# Patient Record
Sex: Female | Born: 1985
Health system: Southern US, Community
[De-identification: ages and names within clinical notes are randomized; demographics above are authoritative.]

## PROBLEM LIST (undated history)

## (undated) DIAGNOSIS — I10 Essential (primary) hypertension: Secondary | ICD-10-CM

## (undated) DIAGNOSIS — J4540 Moderate persistent asthma, uncomplicated: Secondary | ICD-10-CM

## (undated) DIAGNOSIS — M23329 Other meniscus derangements, posterior horn of medial meniscus, unspecified knee: Secondary | ICD-10-CM

## (undated) DIAGNOSIS — O24419 Gestational diabetes mellitus in pregnancy, unspecified control: Secondary | ICD-10-CM

## (undated) DIAGNOSIS — J302 Other seasonal allergic rhinitis: Secondary | ICD-10-CM

## (undated) DIAGNOSIS — S83512A Sprain of anterior cruciate ligament of left knee, initial encounter: Secondary | ICD-10-CM

## (undated) DIAGNOSIS — K219 Gastro-esophageal reflux disease without esophagitis: Secondary | ICD-10-CM

## (undated) DIAGNOSIS — Z3480 Encounter for supervision of other normal pregnancy, unspecified trimester: Secondary | ICD-10-CM

## (undated) DIAGNOSIS — G43909 Migraine, unspecified, not intractable, without status migrainosus: Secondary | ICD-10-CM

## (undated) HISTORY — DX: Essential (primary) hypertension: I10

## (undated) HISTORY — DX: Moderate persistent asthma, uncomplicated: J45.40

## (undated) HISTORY — DX: Encounter for supervision of other normal pregnancy, unspecified trimester: Z34.80

## (undated) HISTORY — DX: Gestational diabetes mellitus in pregnancy, unspecified control: O24.419

---
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
## 2003-10-21 ENCOUNTER — Emergency Department (HOSPITAL_COMMUNITY): Admission: EM | Admit: 2003-10-21 | Discharge: 2003-10-21 | Payer: Self-pay | Admitting: Emergency Medicine

## 2004-03-20 ENCOUNTER — Emergency Department (HOSPITAL_COMMUNITY): Admission: EM | Admit: 2004-03-20 | Discharge: 2004-03-20 | Payer: Self-pay | Admitting: Emergency Medicine

## 2004-12-30 ENCOUNTER — Emergency Department (HOSPITAL_COMMUNITY): Admission: EM | Admit: 2004-12-30 | Discharge: 2004-12-30 | Payer: Self-pay | Admitting: Emergency Medicine

## 2005-01-15 ENCOUNTER — Other Ambulatory Visit: Admission: RE | Admit: 2005-01-15 | Discharge: 2005-01-15 | Payer: Self-pay | Admitting: Obstetrics and Gynecology

## 2005-06-23 ENCOUNTER — Inpatient Hospital Stay (HOSPITAL_COMMUNITY): Admission: AD | Admit: 2005-06-23 | Discharge: 2005-06-23 | Payer: Self-pay | Admitting: Obstetrics and Gynecology

## 2005-06-28 ENCOUNTER — Inpatient Hospital Stay (HOSPITAL_COMMUNITY): Admission: RE | Admit: 2005-06-28 | Discharge: 2005-06-28 | Payer: Self-pay | Admitting: Obstetrics and Gynecology

## 2005-06-29 ENCOUNTER — Inpatient Hospital Stay (HOSPITAL_COMMUNITY): Admission: AD | Admit: 2005-06-29 | Discharge: 2005-07-01 | Payer: Self-pay | Admitting: Obstetrics and Gynecology

## 2005-08-04 ENCOUNTER — Other Ambulatory Visit: Admission: RE | Admit: 2005-08-04 | Discharge: 2005-08-04 | Payer: Self-pay | Admitting: Obstetrics and Gynecology

## 2006-02-27 ENCOUNTER — Emergency Department (HOSPITAL_COMMUNITY): Admission: EM | Admit: 2006-02-27 | Discharge: 2006-02-27 | Payer: Self-pay | Admitting: Family Medicine

## 2006-07-25 ENCOUNTER — Emergency Department (HOSPITAL_COMMUNITY): Admission: EM | Admit: 2006-07-25 | Discharge: 2006-07-25 | Payer: Self-pay | Admitting: Emergency Medicine

## 2008-12-17 ENCOUNTER — Emergency Department (HOSPITAL_COMMUNITY): Admission: EM | Admit: 2008-12-17 | Discharge: 2008-12-17 | Payer: Self-pay | Admitting: Family Medicine

## 2009-03-04 ENCOUNTER — Emergency Department (HOSPITAL_BASED_OUTPATIENT_CLINIC_OR_DEPARTMENT_OTHER): Admission: EM | Admit: 2009-03-04 | Discharge: 2009-03-04 | Payer: Self-pay | Admitting: Emergency Medicine

## 2009-03-06 ENCOUNTER — Emergency Department (HOSPITAL_COMMUNITY): Admission: EM | Admit: 2009-03-06 | Discharge: 2009-03-06 | Payer: Self-pay | Admitting: Family Medicine

## 2009-05-13 ENCOUNTER — Emergency Department (HOSPITAL_COMMUNITY): Admission: EM | Admit: 2009-05-13 | Discharge: 2009-05-13 | Payer: Self-pay | Admitting: Emergency Medicine

## 2009-09-03 ENCOUNTER — Other Ambulatory Visit: Admission: RE | Admit: 2009-09-03 | Discharge: 2009-09-03 | Payer: Self-pay | Admitting: *Deleted

## 2010-04-27 LAB — RAPID STREP SCREEN (MED CTR MEBANE ONLY): Streptococcus, Group A Screen (Direct): NEGATIVE

## 2010-04-29 LAB — POCT I-STAT, CHEM 8
Creatinine, Ser: 0.9 mg/dL (ref 0.4–1.2)
Glucose, Bld: 113 mg/dL — ABNORMAL HIGH (ref 70–99)
HCT: 44 % (ref 36.0–46.0)
Hemoglobin: 15 g/dL (ref 12.0–15.0)
Potassium: 3.5 mEq/L (ref 3.5–5.1)
TCO2: 24 mmol/L (ref 0–100)

## 2010-04-29 LAB — CBC
HCT: 42.5 % (ref 36.0–46.0)
MCHC: 34.3 g/dL (ref 30.0–36.0)
MCV: 95.8 fL (ref 78.0–100.0)
Platelets: 251 10*3/uL (ref 150–400)

## 2010-04-29 LAB — DIFFERENTIAL
Basophils Relative: 0 % (ref 0–1)
Eosinophils Absolute: 0 10*3/uL (ref 0.0–0.7)
Eosinophils Relative: 0 % (ref 0–5)
Lymphs Abs: 1.3 10*3/uL (ref 0.7–4.0)
Monocytes Relative: 2 % — ABNORMAL LOW (ref 3–12)
Neutrophils Relative %: 88 % — ABNORMAL HIGH (ref 43–77)

## 2010-04-29 LAB — POCT CARDIAC MARKERS
CKMB, poc: <1 ng/mL — ABNORMAL LOW (ref 1.0–8.0)
Myoglobin, poc: 158 ng/mL (ref 12–200)
Myoglobin, poc: 178 ng/mL (ref 12–200)
Troponin i, poc: <0.05 ng/mL (ref 0.00–0.09)

## 2010-06-08 ENCOUNTER — Ambulatory Visit (INDEPENDENT_AMBULATORY_CARE_PROVIDER_SITE_OTHER): Payer: 59

## 2010-06-08 ENCOUNTER — Inpatient Hospital Stay (INDEPENDENT_AMBULATORY_CARE_PROVIDER_SITE_OTHER)
Admission: RE | Admit: 2010-06-08 | Discharge: 2010-06-08 | Disposition: A | Discharge status: Home / Self Care | Payer: 59 | Source: Ambulatory Visit | Attending: Family Medicine | Admitting: Family Medicine

## 2010-06-08 DIAGNOSIS — IMO0002 Reserved for concepts with insufficient information to code with codable children: Secondary | ICD-10-CM

## 2010-06-26 NOTE — Discharge Summary (Signed)
NAME:  Casey Mcmahon, Casey Mcmahon               ACCOUNT NO.:  [id number]   MEDICAL RECORD NO.:  [id number]          PATIENT TYPE:  INP   LOCATION:  9129                          FACILITY:  [facility]   PHYSICIAN:  James A. Ashley Royalty, M.D.DATE OF BIRTH:  04-17-1985   DATE OF ADMISSION:  06/29/2005  DATE OF DISCHARGE:  07/01/2005                                 DISCHARGE SUMMARY   DISCHARGE DIAGNOSES:  1.  Intrauterine pregnancy at 37-weeks 5-days gestation, delivery.  2.  Mature lung profile.  3.  Trouble with living child.   SPECIAL PROCEDURES:  Delivery.   CONSULTATIONS:  None.   DISCHARGE MEDICATIONS:  Motrin 600 mg.   HISTORY AND PHYSICAL:  This is a 25 year old gravid 3, para 1, AB 1 at 7-  weeks 5-days gestation.  Prenatal care was complicated by musculoskeletal  discomfort.  The patient requested induction of labor.  The cervix was 3 cm  dilated.  Amniocentesis was performed on Jun 28, 2005 and the resulted  yielded an LS ratio of 3.3 with PG present.  For the remainder of the  history and physical, please see chart.   HOSPITAL COURSE:  [facility].  Admission laboratory studies were drawn.  Artificial rupture of membranes  was accomplished which revealed clear fluid.  The patient went on to labor  and delivery on Jun 29, 2005.  The infant was a 6-pound 8-ounce female.  APGAR was 8 at one minute, 9 at five minutes, sent to the newborn nursery.  Delivery was accomplished over an intact perineum.  There was a first-degree  periurethral laceration which was repaired without difficulty.  The  patient's postpartum course was benign.  She was discharged on the second  postpartum day,, afebrile and in satisfactory condition.      James A. Ashley Royalty, M.D.  Electronically Signed     JAM/MEDQ  D:  07/29/2005  T:  07/29/2005  Job:  [id number]

## 2010-07-02 ENCOUNTER — Emergency Department (HOSPITAL_BASED_OUTPATIENT_CLINIC_OR_DEPARTMENT_OTHER)
Admission: EM | Admit: 2010-07-02 | Discharge: 2010-07-02 | Disposition: A | Discharge status: Home / Self Care | Payer: 59 | Attending: Emergency Medicine | Admitting: Emergency Medicine

## 2010-07-02 DIAGNOSIS — G43909 Migraine, unspecified, not intractable, without status migrainosus: Secondary | ICD-10-CM | POA: Insufficient documentation

## 2010-07-02 DIAGNOSIS — J45909 Unspecified asthma, uncomplicated: Secondary | ICD-10-CM | POA: Insufficient documentation

## 2010-07-02 DIAGNOSIS — I10 Essential (primary) hypertension: Secondary | ICD-10-CM | POA: Insufficient documentation

## 2010-07-02 DIAGNOSIS — Z79899 Other long term (current) drug therapy: Secondary | ICD-10-CM | POA: Insufficient documentation

## 2010-07-02 DIAGNOSIS — E7800 Pure hypercholesterolemia, unspecified: Secondary | ICD-10-CM | POA: Insufficient documentation

## 2010-07-09 ENCOUNTER — Ambulatory Visit (INDEPENDENT_AMBULATORY_CARE_PROVIDER_SITE_OTHER): Payer: 59 | Admitting: Family Medicine

## 2010-07-09 DIAGNOSIS — Z01419 Encounter for gynecological examination (general) (routine) without abnormal findings: Secondary | ICD-10-CM

## 2010-07-09 DIAGNOSIS — Z1272 Encounter for screening for malignant neoplasm of vagina: Secondary | ICD-10-CM

## 2010-07-09 DIAGNOSIS — Z30432 Encounter for removal of intrauterine contraceptive device: Secondary | ICD-10-CM

## 2010-07-10 NOTE — Assessment & Plan Note (Signed)
NAME:  Casey Mcmahon, Casey Mcmahon NO.:  [id number]  MEDICAL RECORD NO.:  [id number]           PATIENT TYPE:  LOCATION:  [facility]           FACILITY:  PHYSICIAN:  Tinnie Gens, MD        DATE OF BIRTH:  06/17/1985  DATE OF SERVICE:  07/09/2010                                 CLINIC NOTE  CHIEF COMPLAINT:  Yearly exam and IUD removal.  HISTORY OF PRESENT ILLNESS:  The patient is a 25 year old, gravida 4, para 2-0-2-2, who has had an IUD since 2007, placed by Dr. Sylvester Harder.  She has recently started having migraine headaches and weight gain and she has seen a neurologist, who told her perhaps her migraines are related to her IUD.  She has been on pills in the past that causes significant nausea and hard to remember to take them every day.  She is uninterested in that.  After lengthy discussion, she is interested in birth control.  We discussed Implanon and Depo, limitations of those, weight gain associated with those, and similar hormone method as her IUD.  We discussed copper IUD as a hormone free method that she could try and would be a good candidate for and NuvaRing.  She also has a history of hypertension.  We discussed NuvaRing impact on her blood pressure.  PAST MEDICAL HISTORY:  Significant for asthma, hypertension, migraine headaches, and weight gain.  PAST SURGICAL HISTORY:  Negative.  MEDICATIONS:  She is on Topamax, Naprosyn, and Lotemax.  ALLERGIES:  None known.  OB HISTORY:  She is G4, P2, two vaginal deliveries.  GYN HISTORY:  Last Pap was July 2011.  No history of abnormal paps or STDs.  FAMILY HISTORY:  Significant for diabetes in her mother and grandmother. Her daughter has epilepsy.  SOCIAL HISTORY:  She lives with her boyfriend, her children, and her parents temporarily.  She works in Clinical biochemist.  She does not smoke cigarettes.  She drinks alcohol approximately once weekly and one caffeinated beverage per day.  REVIEW OF  SYSTEMS:  A 14-point review of systems are reviewed.  Please see GYN history in the chart, diffusely positive for numbness, fatigue, weight loss, weight gain, frequent headaches, problem with vision, nausea, vomiting, blood in her stool, and hot flashes.  PHYSICAL EXAM:  VITAL SIGNS:  Today, her blood pressure is 133/97, weight is 201. GENERAL:  She is a well-developed, well-nourished female, in no acute distress. HEENT:  Normocephalic, atraumatic.  Sclerae anicteric. NECK:  Supple.  Normal thyroid. LUNGS:  Clear bilaterally. CV:  Regular rate and rhythm without rubs, gallops, or murmurs. ABDOMEN:  Soft, nontender, nondistended. EXTREMITIES:  No cyanosis, clubbing, or edema.  2+ distal pulses. BREASTS:  Symmetric with everted nipples.  No discrete masses.  She does have fibrocystic change periareolarly.  No supraclavicular or axillary adenopathy. GU:  Normal external female genitalia.  BUS normal.  Vagina is pink and rugated.  Cervix is parous without lesion.  The rest of her GU exam reveals a small anteverted uterus.  No adnexal mass or tenderness.  PROCEDURE:  IUD was removed without difficulty.   IMPRESSION: 1. GYN exam with Pap. 2. Desires IUD removal for one extend 5  years, and for two, she has     history of migraine headaches and she does not want a Mirena     reinserted. 3. Contraceptive counseling.  PLAN:  After a lengthy discussion was held with the patient, she has opted for NuvaRing trial.  We have given her two samples.  She is instructed to check her blood pressure weekly and follow with her PCP if it continues to go up.  If she likes this method and it seems to work for her and her blood pressure, we will call her in a prescription for this. If not, she can return to discuss other options in about 2 months. Copper IUD may be the best choice for her.  We did discuss bleeding pattern associated with that device however.           ______________________________ Tinnie Gens, MD    TP/MEDQ  D:  07/09/2010  T:  07/09/2010  Job:  [id number]

## 2010-08-26 ENCOUNTER — Encounter: Payer: Self-pay | Admitting: Gynecology

## 2010-08-31 ENCOUNTER — Other Ambulatory Visit (INDEPENDENT_AMBULATORY_CARE_PROVIDER_SITE_OTHER): Payer: 59

## 2010-08-31 DIAGNOSIS — N912 Amenorrhea, unspecified: Secondary | ICD-10-CM

## 2010-08-31 LAB — HCG, QUANTITATIVE, PREGNANCY: hCG, Beta Chain, Quant, S: <2 m[IU]/mL

## 2010-09-01 ENCOUNTER — Telehealth: Payer: Self-pay

## 2010-09-01 NOTE — Telephone Encounter (Signed)
Patient needs results from yesterday call her on cell please and leave a message with results if she does not pick up. Thanks!

## 2010-09-01 NOTE — Telephone Encounter (Signed)
Patient called and results left on voicemail as she wished.  Pregnancy test Negative

## 2010-10-20 ENCOUNTER — Encounter: Payer: Self-pay | Admitting: Nurse Practitioner

## 2010-10-20 ENCOUNTER — Ambulatory Visit (INDEPENDENT_AMBULATORY_CARE_PROVIDER_SITE_OTHER): Payer: 59 | Admitting: Nurse Practitioner

## 2010-10-20 VITALS — BP 119/79 | HR 84 | Ht 68.5 in | Wt 202.0 lb

## 2010-10-20 DIAGNOSIS — N926 Irregular menstruation, unspecified: Secondary | ICD-10-CM

## 2010-10-20 DIAGNOSIS — N898 Other specified noninflammatory disorders of vagina: Secondary | ICD-10-CM

## 2010-10-20 DIAGNOSIS — N939 Abnormal uterine and vaginal bleeding, unspecified: Secondary | ICD-10-CM

## 2010-10-20 DIAGNOSIS — Z3009 Encounter for other general counseling and advice on contraception: Secondary | ICD-10-CM

## 2010-10-20 LAB — POCT URINE PREGNANCY: Preg Test, Ur: NEGATIVE

## 2010-10-20 MED ORDER — ETONOGESTREL-ETHINYL ESTRADIOL 0.12-0.015 MG/24HR VA RING: VAGINAL_RING | VAGINAL | Status: DC

## 2010-10-20 NOTE — Progress Notes (Signed)
S: Pt  C/O vaginal bleeding since 10/05/10. Changes tampon q2-3 hours. Does not seem to be slowing down. Had her Cleta Alberts IUD out in May. Thought it may help her migraines. It has not seemed to have an impact. She was given Nuvaring at that time. She saw something on TV that scared her away from Nuvaring. Last unprotected intercourse was 1 month ago. She does not desire pregnancy at this time.  O: PE vaginal moderate amt blood in vault. Cervix with lesions. Nuvaring placed  Lab UPT negative  A: Abnormal vaginal bleeding Contraception Migraine  P: After discussion she will retrial Nuvaring. Sample placed today and RX given for one year. If bleeding does not stop she will call office

## 2010-10-20 NOTE — Progress Notes (Signed)
Had Mirena removed in May and it had a pretty normal cycle for one week, she has seen nothing since until August 22, she has been bleeding heavy since this time.  She is interested in having the Mirena put back in as her Migraine syptoms as well as other symptoms that she thought might be coming from this are still present.

## 2010-12-04 ENCOUNTER — Inpatient Hospital Stay (INDEPENDENT_AMBULATORY_CARE_PROVIDER_SITE_OTHER)
Admission: RE | Admit: 2010-12-04 | Discharge: 2010-12-04 | Disposition: A | Discharge status: Home / Self Care | Payer: 59 | Source: Ambulatory Visit | Attending: Emergency Medicine | Admitting: Emergency Medicine

## 2010-12-04 DIAGNOSIS — N949 Unspecified condition associated with female genital organs and menstrual cycle: Secondary | ICD-10-CM

## 2010-12-04 LAB — POCT URINALYSIS DIP (DEVICE)
Leukocytes, UA: NEGATIVE
Nitrite: NEGATIVE
Protein, ur: 100 mg/dL — AB
Urobilinogen, UA: 2 mg/dL — ABNORMAL HIGH (ref 0.0–1.0)
pH: 6 (ref 5.0–8.0)

## 2010-12-04 LAB — POCT PREGNANCY, URINE: Preg Test, Ur: NEGATIVE

## 2010-12-10 ENCOUNTER — Ambulatory Visit: Payer: 59 | Admitting: Obstetrics and Gynecology

## 2010-12-22 ENCOUNTER — Encounter: Payer: Self-pay | Admitting: Obstetrics & Gynecology

## 2010-12-22 ENCOUNTER — Ambulatory Visit (INDEPENDENT_AMBULATORY_CARE_PROVIDER_SITE_OTHER): Payer: 59 | Admitting: Obstetrics & Gynecology

## 2010-12-22 VITALS — BP 128/92 | HR 85 | Ht 68.0 in | Wt 199.0 lb

## 2010-12-22 DIAGNOSIS — Z30430 Encounter for insertion of intrauterine contraceptive device: Secondary | ICD-10-CM

## 2010-12-22 DIAGNOSIS — IMO0001 Reserved for inherently not codable concepts without codable children: Secondary | ICD-10-CM

## 2010-12-22 LAB — POCT URINE PREGNANCY: Preg Test, Ur: NEGATIVE

## 2010-12-22 NOTE — Patient Instructions (Signed)
Intrauterine Device Insertion Care After Refer to this sheet in the next few weeks. These instructions provide you with information on caring for yourself after your procedure. Your caregiver may also give you more specific instructions. Your treatment has been planned according to current medical practices, but problems sometimes occur. Call your caregiver if you have any problems or questions after your procedure. HOME CARE INSTRUCTIONS   Only take over-the-counter or prescription medicines for pain, discomfort, or fever as directed by your caregiver. Do not use aspirin. This may increase bleeding.   Check your IUD to make sure it is in place before you resume sexual activity. You should be able to feel the strings. If you cannot feel the strings, something may be wrong. The IUD may have fallen out of the uterus, or the uterus may have been punctured (perforated) during placement. Also, if the strings are getting longer, it may mean that the IUD is being forced out of the uterus. You no longer have full protection from pregnancy if any of these problems occur.   You may resume sexual intercourse if you are not having problems with the IUD. The IUD is considered immediately effective.   You may resume normal activities.   Keep all follow-up appointments to be sure your IUD has remained in place. After the first exam, yearly exams are advised, unless you cannot feel the strings of your IUD.   Continue to check that the IUD is still in place by feeling for the strings after every menstrual period.  SEEK MEDICAL CARE IF:   You have bleeding that is heavier or lasts longer than a normal menstrual cycle.   You have a fever.   You have increasing cramps or abdominal pain not relieved with medicine.   You have abdominal pain that does not seem to be related to the same area of earlier cramping and pain.   You are lightheaded, unusually weak, or faint.   You have abnormal vaginal discharge or  smells.   You have pain during sexual intercourse.   You cannot feel the IUD strings, or the IUD string has gotten longer.   You feel the IUD at the opening of the cervix in the vagina.   You think you are pregnant, or you miss your menstrual period.   The IUD string is hurting your sex partner.  Document Released: 09/23/2010 Document Revised: 10/07/2010 Document Reviewed: 09/23/2010 Exit[employer] Patient Information 2012 [employer]. 

## 2010-12-22 NOTE — Progress Notes (Signed)
History:  25 y.o. [id number] here for Mirena IUD insertion for contraception.  Had one in the past, removed it in 06/2010.  Now wants it replaced.  No other GYN concerns.  Last pap in 06/2010 was normal.  The following portions of the patient's history were reviewed and updated as appropriate: allergies, current medications, past family history, past medical history, past social history, past surgical history and problem list.   Objective:  Physical Exam Blood pressure 128/92, pulse 85, height 5\' 8"  (1.727 m), weight 199 lb (90.266 kg), last menstrual period 11/16/2010. Gen: NAD Abd: Soft, nontender and nondistended Pelvic: Normal appearing external genitalia; normal appearing vaginal mucosa and cervix.  Normal discharge.  Small uterus, no palpable masses or adnexal tenderness.  IUD Procedure Note Patient identified, informed consent performed.  Discussed risks of malpositioning or placement of the IUD outside the uterus which may require further procedures. Time out was performed.  Urine pregnancy test negative.  Speculum placed in the vagina.  Cervix visualized.  Cleaned with Betadine x 2.  Grasped anteriorly with a single tooth tenaculum.  Uterus sounded to 8 cm.  Mirena IUD placed per manufacturer's recommendations.  Strings trimmed to 3 cm. Tenaculum was removed, good hemostasis noted.  Patient tolerated procedure well.   Patient given post procedure instructions and Mirena care card with expiration date.  Patient is asked to check IUD strings periodically and follow up in 4 weeks for IUD check.   Assessment & Plan:  Mirena IUD placement successful. Preventative health maintenance up-to-date Return to clinic in 4 weeks for IUD check

## 2011-01-20 ENCOUNTER — Ambulatory Visit: Payer: 59 | Admitting: Obstetrics & Gynecology

## 2011-01-20 DIAGNOSIS — Z30431 Encounter for routine checking of intrauterine contraceptive device: Secondary | ICD-10-CM

## 2011-07-12 ENCOUNTER — Encounter (HOSPITAL_COMMUNITY): Payer: Self-pay | Admitting: Cardiology

## 2011-07-12 ENCOUNTER — Emergency Department (INDEPENDENT_AMBULATORY_CARE_PROVIDER_SITE_OTHER)
Admission: EM | Admit: 2011-07-12 | Discharge: 2011-07-12 | Disposition: A | Discharge status: Home / Self Care | Payer: 59 | Source: Home / Self Care | Attending: Emergency Medicine | Admitting: Emergency Medicine

## 2011-07-12 DIAGNOSIS — J309 Allergic rhinitis, unspecified: Secondary | ICD-10-CM

## 2011-07-12 DIAGNOSIS — J45909 Unspecified asthma, uncomplicated: Secondary | ICD-10-CM

## 2011-07-12 DIAGNOSIS — J302 Other seasonal allergic rhinitis: Secondary | ICD-10-CM

## 2011-07-12 MED ORDER — ALBUTEROL SULFATE (5 MG/ML) 0.5% IN NEBU
2.5000 mg | INHALATION_SOLUTION | Freq: Once | RESPIRATORY_TRACT | Status: AC
  Administered 2011-07-12: 2.5 mg via RESPIRATORY_TRACT

## 2011-07-12 MED ORDER — ALBUTEROL SULFATE HFA 108 (90 BASE) MCG/ACT IN AERS: 2.0000 | INHALATION_SPRAY | Freq: Four times a day (QID) | RESPIRATORY_TRACT | Status: DC | PRN

## 2011-07-12 MED ORDER — ALBUTEROL SULFATE (5 MG/ML) 0.5% IN NEBU
INHALATION_SOLUTION | RESPIRATORY_TRACT | Status: AC
  Filled 2011-07-12: qty 0.5

## 2011-07-12 MED ORDER — CETIRIZINE-PSEUDOEPHEDRINE ER 5-120 MG PO TB12: 1.0000 | ORAL_TABLET | Freq: Every day | ORAL | Status: AC

## 2011-07-12 NOTE — ED Notes (Addendum)
Pt speaking complete sentences.  No wheezing noted upon auscultation.  Denies any significant distress at this time.  SaO2 = 100%, RR = 18.  Pt notified of wait; instructed pt to notify registration clerk immediately if she feels she gets any worsening sxs.  Pt verbalized understanding.

## 2011-07-12 NOTE — ED Notes (Signed)
Second set of vitals obtained by [facility] student

## 2011-07-12 NOTE — ED Notes (Signed)
Pt reports shortness of breath and chest tightness on and off for the past week but felt self trying to catch breath at work today. Denies fever.

## 2011-07-12 NOTE — ED Provider Notes (Signed)
History     CSN: [id number]  Arrival date & time 07/12/11  1843   First MD Initiated Contact with Patient 07/12/11 1849      Chief Complaint  Patient presents with  . Shortness of Breath  . Cough  . Nasal Congestion    (Consider location/radiation/quality/duration/timing/severity/associated sxs/prior treatment) HPI Comments: For about a week she has been feeling short of breath and chest tightness on and off. She describes that she has always have allergies with upper congestion closer runny and congested nose. Does feel tight like I can't breathe well. Patient denies any palpitations or chest pains. Also denies any fevers or recent cough. She describes it "as I always have allergies all the time" no vitals taken any medicines for it.   Patient is a 26 y.o. female presenting with shortness of breath and cough. The history is provided by the patient.  Shortness of Breath  The current episode started more than 1 week ago. The problem occurs frequently. The problem has been gradually worsening. The problem is moderate. The symptoms are relieved by nothing. Associated symptoms include rhinorrhea, cough and shortness of breath. Pertinent negatives include no chest pain, no chest pressure, no fever, no stridor and no wheezing.  Cough Associated symptoms include rhinorrhea and shortness of breath. Pertinent negatives include no chest pain and no wheezing.    Past Medical History  Diagnosis Date  . Asthma   . Hypertension   . Migraine headache     History reviewed. No pertinent past surgical history.  Family History  Problem Relation Age of Onset  . Diabetes Maternal Grandfather   . Diabetes Mother     History  Substance Use Topics  . Smoking status: Never Smoker   . Smokeless tobacco: Not on file  . Alcohol Use: Yes     occas social    OB History    Grav Para Term Preterm Abortions TAB SAB Ect Mult Living                  Review of Systems  Constitutional: Negative for  fever, activity change and appetite change.  HENT: Positive for rhinorrhea.   Respiratory: Positive for cough and shortness of breath. Negative for apnea, chest tightness, wheezing and stridor.   Cardiovascular: Negative for chest pain.  Neurological: Negative for dizziness.    Allergies  Review of patient's allergies indicates no known allergies.  Home Medications   Current Outpatient Rx  Name Route Sig Dispense Refill  . ALBUTEROL SULFATE HFA 108 (90 BASE) MCG/ACT IN AERS Inhalation Inhale 2 puffs into the lungs every 6 (six) hours as needed for wheezing or shortness of breath. 1 Inhaler 0  . CETIRIZINE-PSEUDOEPHEDRINE ER 5-120 MG PO TB12 Oral Take 1 tablet by mouth daily. 30 tablet 0  . DICLOFENAC SODIUM 50 MG PO TBEC Oral Take 50 mg by mouth 2 (two) times daily.      . ETONOGESTREL-ETHINYL ESTRADIOL 0.12-0.015 MG/24HR VA RING  Insert vaginally and leave in place for 3 consecutive weeks, then remove for 1 week. 1 each 12  . LISINOPRIL 10 MG PO TABS Oral Take 10 mg by mouth daily.      BP 141/88  Pulse 69  Temp(Src) 98.5 F (36.9 C) (Oral)  Resp 14  SpO2 100%  LMP 12/12/2010  Physical Exam  Nursing note and vitals reviewed. Constitutional: She appears well-developed and well-nourished. She is not intubated.  Cardiovascular: Normal rate, normal heart sounds and intact distal pulses.  Exam reveals no  gallop and no friction rub.   No murmur heard. Pulmonary/Chest: Effort normal. No accessory muscle usage. No apnea, not tachypneic and not bradypneic. She is not intubated. No respiratory distress. She has decreased breath sounds. She has no wheezes. She has no rhonchi. She has no rales.    ED Course  Procedures (including critical care time)  Labs Reviewed - No data to display No results found.   1. Reactive airway disease   2. Seasonal allergies       MDM  Dyspnea with cough and chest tightness. With coexistent allergy symptoms. Responsive to breathing treatment  patient was prescribed neutral and an antihistamine. Afebrile and in no respiratory distress. Patient has some degree of prolonged expiration. Although no active wheezing was noted on exam. Suspect patient has some degree of reactive airway disease along with some upper respiratory symptoms possibly allergenic in etiology. Have discussed with patient's what symptoms will warrant further evaluation in the emergency department, she agree with treatment plan and followup care as necessary        Jimmie Molly, MD 07/12/11 2118

## 2011-07-12 NOTE — ED Notes (Signed)
Vitals obtained by [facility] student

## 2011-12-02 ENCOUNTER — Other Ambulatory Visit: Payer: Self-pay | Admitting: Physician Assistant

## 2013-07-15 ENCOUNTER — Encounter (HOSPITAL_COMMUNITY): Payer: Self-pay | Admitting: Emergency Medicine

## 2013-07-15 ENCOUNTER — Emergency Department (HOSPITAL_COMMUNITY)
Admission: EM | Admit: 2013-07-15 | Discharge: 2013-07-15 | Disposition: A | Discharge status: Home / Self Care | Payer: 59 | Attending: Emergency Medicine | Admitting: Emergency Medicine

## 2013-07-15 ENCOUNTER — Emergency Department (HOSPITAL_COMMUNITY): Payer: 59

## 2013-07-15 DIAGNOSIS — J45909 Unspecified asthma, uncomplicated: Secondary | ICD-10-CM | POA: Insufficient documentation

## 2013-07-15 DIAGNOSIS — X500XXA Overexertion from strenuous movement or load, initial encounter: Secondary | ICD-10-CM | POA: Insufficient documentation

## 2013-07-15 DIAGNOSIS — Y9341 Activity, dancing: Secondary | ICD-10-CM | POA: Insufficient documentation

## 2013-07-15 DIAGNOSIS — Z791 Long term (current) use of non-steroidal anti-inflammatories (NSAID): Secondary | ICD-10-CM | POA: Insufficient documentation

## 2013-07-15 DIAGNOSIS — S82843A Displaced bimalleolar fracture of unspecified lower leg, initial encounter for closed fracture: Secondary | ICD-10-CM | POA: Insufficient documentation

## 2013-07-15 DIAGNOSIS — S82399A Other fracture of lower end of unspecified tibia, initial encounter for closed fracture: Secondary | ICD-10-CM

## 2013-07-15 DIAGNOSIS — Y929 Unspecified place or not applicable: Secondary | ICD-10-CM | POA: Insufficient documentation

## 2013-07-15 DIAGNOSIS — Z79899 Other long term (current) drug therapy: Secondary | ICD-10-CM | POA: Insufficient documentation

## 2013-07-15 DIAGNOSIS — S82409A Unspecified fracture of shaft of unspecified fibula, initial encounter for closed fracture: Secondary | ICD-10-CM

## 2013-07-15 DIAGNOSIS — I10 Essential (primary) hypertension: Secondary | ICD-10-CM | POA: Insufficient documentation

## 2013-07-15 DIAGNOSIS — R296 Repeated falls: Secondary | ICD-10-CM | POA: Insufficient documentation

## 2013-07-15 MED ORDER — HYDROMORPHONE HCL PF 1 MG/ML IJ SOLN
1.0000 mg | Freq: Once | INTRAMUSCULAR | Status: AC
  Administered 2013-07-15: 1 mg via INTRAVENOUS
  Filled 2013-07-15: qty 1

## 2013-07-15 MED ORDER — OXYCODONE-ACETAMINOPHEN 5-325 MG PO TABS: 1.0000 | ORAL_TABLET | Freq: Four times a day (QID) | ORAL | Status: DC | PRN

## 2013-07-15 NOTE — ED Notes (Signed)
Pt arrived to the ED with a complaint of left ankle pain.  Pt states she had a fall and when she got up she had ankle pain and was unable to stand on it.  Fall happened 30 minutes from this writing.

## 2013-07-15 NOTE — ED Provider Notes (Signed)
CSN: [id number]     Arrival date & time 07/15/13  0145 History   First MD Initiated Contact with Patient 07/15/13 0208     Chief Complaint  Patient presents with  . Ankle Pain     (Consider location/radiation/quality/duration/timing/severity/associated sxs/prior Treatment) HPI Patient presents to the emergency department with a left ankle injury.  Patient hurt her left ankle tonight while dancing.  Patient, states, that she was dancing when she twisted her ankle and fell patient, states she did not have any other injuries.  Patient denies nausea, vomiting, numbness, weakness, dizziness, headache, blurred vision, neck pain, back pain, or syncope.  The patient, states, that movement and palpation make the pain, worse and nothing seems make her condition, better.  Patient, states, that she did not take any medications prior to arrival Past Medical History  Diagnosis Date  . Asthma   . Hypertension   . Migraine headache    History reviewed. No pertinent past surgical history. Family History  Problem Relation Age of Onset  . Diabetes Maternal Grandfather   . Diabetes Mother    History  Substance Use Topics  . Smoking status: Never Smoker   . Smokeless tobacco: Not on file  . Alcohol Use: Yes     Comment: occas social   OB History   Grav Para Term Preterm Abortions TAB SAB Ect Mult Living                 Review of Systems All other systems negative except as documented in the HPI. All pertinent positives and negatives as reviewed in the HPI.    Allergies  Review of patient's allergies indicates no known allergies.  Home Medications   Prior to Admission medications   Medication Sig Start Date End Date Taking? Authorizing Provider  albuterol (PROVENTIL HFA;VENTOLIN HFA) 108 (90 BASE) MCG/ACT inhaler Inhale 2 puffs into the lungs every 6 (six) hours as needed for wheezing or shortness of breath. 07/12/11 05/11/12  Jimmie MollyPaolo Coll, MD  diclofenac (VOLTAREN) 50 MG EC tablet Take 50 mg by  mouth 2 (two) times daily.      Historical Provider, MD  etonogestrel-ethinyl estradiol (NUVARING) 0.12-0.015 MG/24HR vaginal ring Insert vaginally and leave in place for 3 consecutive weeks, then remove for 1 week. 10/20/10 10/20/11  Delbert PhenixLinda M Barefoot, NP  lisinopril (PRINIVIL,ZESTRIL) 10 MG tablet Take 10 mg by mouth daily.    Historical Provider, MD   BP 139/78  Pulse 133  Temp(Src) 97.9 F (36.6 C) (Oral)  Resp 16  SpO2 99% Physical Exam  Nursing note and vitals reviewed. Constitutional: She is oriented to person, place, and time. She appears well-developed and well-nourished. No distress.  HENT:  Head: Normocephalic and atraumatic.  Eyes: Pupils are equal, round, and reactive to light.  Neck: Normal range of motion. Neck supple.  Cardiovascular: Normal rate, regular rhythm and normal heart sounds.   Pulmonary/Chest: Effort normal and breath sounds normal.  Musculoskeletal:       Left ankle: She exhibits decreased range of motion, swelling and ecchymosis. She exhibits no laceration and normal pulse. Tenderness. Lateral malleolus and medial malleolus tenderness found. Achilles tendon normal.  Neurological: She is alert and oriented to person, place, and time. She exhibits normal muscle tone. Coordination normal.  Skin: Skin is warm and dry. No erythema.    ED Course  Procedures (including critical care time) Labs Review Labs Reviewed - No data to display  Imaging Review Dg Tibia/fibula Left  07/15/2013   CLINICAL DATA:  Dancing injury.  EXAM: LEFT ANKLE COMPLETE - 3+ VIEW; LEFT TIBIA AND FIBULA - 2 VIEW  COMPARISON:  None.  FINDINGS: Oblique nondisplaced comminuted distal fibular diaphyseal fracture. In addition, nondisplaced posterior tibial intra-articular fracture. Mild widening of the medial clear space. Lateral clear space appears intact. Possible tiny medial malleolus avulsion injury. No destructive bony lesions. Ankle soft tissue swelling without subcutaneous gas or radiopaque  foreign bodies.  IMPRESSION: Nondisplaced posterior malleolus and possible nondisplaced tiny medial malleolus avulsion fracture.  Nondisplaced distal fibular diaphyseal fracture.  Suspected tibiotalar ligamentous injury.   Electronically Signed   By: Awilda Metro   On: 07/15/2013 03:08   Dg Ankle Complete Left  07/15/2013   CLINICAL DATA:  Dancing injury.  EXAM: LEFT ANKLE COMPLETE - 3+ VIEW; LEFT TIBIA AND FIBULA - 2 VIEW  COMPARISON:  None.  FINDINGS: Oblique nondisplaced comminuted distal fibular diaphyseal fracture. In addition, nondisplaced posterior tibial intra-articular fracture. Mild widening of the medial clear space. Lateral clear space appears intact. Possible tiny medial malleolus avulsion injury. No destructive bony lesions. Ankle soft tissue swelling without subcutaneous gas or radiopaque foreign bodies.  IMPRESSION: Nondisplaced posterior malleolus and possible nondisplaced tiny medial malleolus avulsion fracture.  Nondisplaced distal fibular diaphyseal fracture.  Suspected tibiotalar ligamentous injury.   Electronically Signed   By: Awilda Metro   On: 07/15/2013 03:08     Patient has single for orthopedics, in the past to be referred back to them.  Patient is placed in a splint and crutches.  Told to return here as needed.  Told to ice and elevate the area.  Told her that she is nonweightbearing.    Carlyle Dolly, PA-C 07/15/13 [phone]

## 2013-07-15 NOTE — ED Notes (Signed)
Patient transported to X-ray 

## 2013-07-15 NOTE — Discharge Instructions (Signed)
Return here as needed.  Followup with the orthopedist.  Call them Monday morning for an appointment

## 2013-07-15 NOTE — Progress Notes (Signed)
Orthopedic Tech Progress Note Patient Details:  Casey Mcmahon [birth year] [id number]  Ortho Devices Type of Ortho Device: Crutches;Post (short leg) splint;Stirrup splint   Haskell Flirt 07/15/2013, 3:36 AM

## 2013-07-15 NOTE — ED Notes (Signed)
Pt. Preferred to use her old crutches at home.

## 2013-07-16 NOTE — ED Provider Notes (Signed)
Medical screening examination/treatment/procedure(s) were performed by non-physician practitioner and as supervising physician I was immediately available for consultation/collaboration.   EKG Interpretation None       Rayola Everhart M Trevonne Nyland, MD 07/16/13 0452 

## 2013-07-17 ENCOUNTER — Encounter (HOSPITAL_BASED_OUTPATIENT_CLINIC_OR_DEPARTMENT_OTHER): Payer: Self-pay | Admitting: *Deleted

## 2013-07-17 NOTE — Pre-Procedure Instructions (Signed)
To come for BMET and EKG 

## 2013-07-18 ENCOUNTER — Encounter (HOSPITAL_BASED_OUTPATIENT_CLINIC_OR_DEPARTMENT_OTHER)
Admission: RE | Admit: 2013-07-18 | Discharge: 2013-07-18 | Disposition: A | Discharge status: Home / Self Care | Payer: 59 | Source: Ambulatory Visit | Attending: Orthopedic Surgery | Admitting: Orthopedic Surgery

## 2013-07-18 ENCOUNTER — Other Ambulatory Visit: Payer: Self-pay

## 2013-07-18 ENCOUNTER — Other Ambulatory Visit: Payer: Self-pay | Admitting: Physician Assistant

## 2013-07-18 LAB — BASIC METABOLIC PANEL
BUN: 8 mg/dL (ref 6–23)
CO2: 26 meq/L (ref 19–32)
Calcium: 9.2 mg/dL (ref 8.4–10.5)
Chloride: 102 mEq/L (ref 96–112)
Creatinine, Ser: 0.74 mg/dL (ref 0.50–1.10)
GFR calc Af Amer: >90 mL/min (ref >90)
GFR calc non Af Amer: >90 mL/min (ref >90)
GLUCOSE: 98 mg/dL (ref 70–99)
Potassium: 4.2 mEq/L (ref 3.7–5.3)
SODIUM: 140 meq/L (ref 137–147)

## 2013-07-18 NOTE — H&P (Signed)
  Daleyssa Loiselle/[facility] [address]   [address] [place], [place] [address] [phone] A Division of [facility]  Loreta Ave, M.D.   Robert A. Thurston Hole, M.D.   Burnell Blanks, M.D.   Eulas Post, M.D.   Lunette Stands, M.D Jewel Baize. Eulah Pont, M.D.  Buford Dresser, M.D.  Estell Harpin, M.D.    Melina Fiddler, M.D. Mary L. Isidoro Donning, PA-C  Kirstin A. Shepperson, PA-C  Josh McLean, PA-C [place], [place]   RE: Nissa, [place]                                [phone]      DOB: Aug 30, 1985 PROGRESS NOTE: 07-17-13 Almetta comes in for evaluation and treatment recommendation for a new injury.  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  As a result she has episodes of this giving way with activities of daily living.  She was dancing at a wedding this past Saturday, her knee buckled and her ankle twisted.  She fell with a vertical load external rotation injury to her left ankle.  She was seen subsequent to that at [facility] on July 15, 2013.  Films revealed an oblique fracture, fibular shaft, about a quarter of the way up the leg.  Widening syndesmosis.  Widening medial joint space.  Non-displaced posterior malleolar fracture.  Seen in our office by Dr. Farris Has on the 8th of June.  She is in a posterior splint.  Referred to me today for definitive treatment of her ankle.  She has been in a splint non-weight bearing.  Previous treatment and history all reviewed, updated and included in the chart.  This includes review of the issues she has had with her left knee.       EXAMINATION: General exam is outlined and included in the chart.  Specifically, healthy appearing 28 year-old female in no acute distress.  I have removed her splint.  She has a moderate amount of swelling from mid calf all the way down to her toes.  Tender medial and lateral ankle over her fibula fracture, as well  as over the syndesmosis.  She is neurovascularly intact distally.   X-RAYS: Repeat x-rays were obtained out of her splint.  Her previous films had all been oblique and I wanted to get a good lateral and mortise view.  Shows a pattern of external rotation injury.  Torn distal tib/fib ligament.  Extension into a low fibular shaft fracture.  Mildly displaced posterior malleolar fracture.  Widened medial joint space.    DISPOSITION:  Injury and need for treatment thoroughly outlined.  She needs a reduction.  Hopefully closed treatment of her deltoid ligament injury and posterior malleolar fracture.  Open reduction internal fixation of her fibula fracture with a short plate and then repair of her syndesmotic injury with tightrope fixation.  Procedure, risks, benefits and complications reviewed in detail.  Between now and then a new well padded splint is applied.  Strict elevation to get her swelling down.  Stay off of this altogether.  I will see her later this week to proceed with definitive treatment.    Loreta Ave, M.D.   Electronically verified by Loreta Ave, M.D. DFM:jjh D 07-17-13 T 07-17-13

## 2013-07-19 ENCOUNTER — Encounter (HOSPITAL_BASED_OUTPATIENT_CLINIC_OR_DEPARTMENT_OTHER): Payer: 59 | Admitting: Anesthesiology

## 2013-07-19 ENCOUNTER — Encounter (HOSPITAL_BASED_OUTPATIENT_CLINIC_OR_DEPARTMENT_OTHER)
Admission: RE | Disposition: A | Discharge status: Home / Self Care | Payer: Self-pay | Source: Ambulatory Visit | Attending: Orthopedic Surgery

## 2013-07-19 ENCOUNTER — Encounter (HOSPITAL_BASED_OUTPATIENT_CLINIC_OR_DEPARTMENT_OTHER): Payer: Self-pay | Admitting: *Deleted

## 2013-07-19 ENCOUNTER — Ambulatory Visit (HOSPITAL_BASED_OUTPATIENT_CLINIC_OR_DEPARTMENT_OTHER): Payer: 59 | Admitting: Anesthesiology

## 2013-07-19 ENCOUNTER — Ambulatory Visit (HOSPITAL_BASED_OUTPATIENT_CLINIC_OR_DEPARTMENT_OTHER)
Admission: RE | Admit: 2013-07-19 | Discharge: 2013-07-19 | Disposition: A | Discharge status: Home / Self Care | Payer: 59 | Source: Ambulatory Visit | Attending: Orthopedic Surgery | Admitting: Orthopedic Surgery

## 2013-07-19 DIAGNOSIS — S82899A Other fracture of unspecified lower leg, initial encounter for closed fracture: Secondary | ICD-10-CM | POA: Insufficient documentation

## 2013-07-19 DIAGNOSIS — X500XXA Overexertion from strenuous movement or load, initial encounter: Secondary | ICD-10-CM | POA: Insufficient documentation

## 2013-07-19 DIAGNOSIS — Y9289 Other specified places as the place of occurrence of the external cause: Secondary | ICD-10-CM | POA: Insufficient documentation

## 2013-07-19 HISTORY — DX: Other seasonal allergic rhinitis: J30.2

## 2013-07-19 HISTORY — DX: Gastro-esophageal reflux disease without esophagitis: K21.9

## 2013-07-19 HISTORY — PX: ORIF ANKLE FRACTURE: SHX5408

## 2013-07-19 LAB — POCT HEMOGLOBIN-HEMACUE: HEMOGLOBIN: 13.6 g/dL (ref 12.0–15.0)

## 2013-07-19 SURGERY — OPEN REDUCTION INTERNAL FIXATION (ORIF) ANKLE FRACTURE
Anesthesia: General | Site: Ankle | Laterality: Left

## 2013-07-19 MED ORDER — MIDAZOLAM HCL 2 MG/2ML IJ SOLN
INTRAMUSCULAR | Status: AC
  Filled 2013-07-19: qty 2

## 2013-07-19 MED ORDER — CEFAZOLIN SODIUM-DEXTROSE 2-3 GM-% IV SOLR
2.0000 g | INTRAVENOUS | Status: AC
  Administered 2013-07-19: 2 g via INTRAVENOUS

## 2013-07-19 MED ORDER — CHLORHEXIDINE GLUCONATE 4 % EX LIQD: 60.0000 mL | Freq: Once | CUTANEOUS | Status: DC

## 2013-07-19 MED ORDER — MIDAZOLAM HCL 2 MG/2ML IJ SOLN
1.0000 mg | INTRAMUSCULAR | Status: DC | PRN
  Administered 2013-07-19: 2 mg via INTRAVENOUS

## 2013-07-19 MED ORDER — FENTANYL CITRATE 0.05 MG/ML IJ SOLN
50.0000 ug | INTRAMUSCULAR | Status: DC | PRN
  Administered 2013-07-19: 100 ug via INTRAVENOUS

## 2013-07-19 MED ORDER — ONDANSETRON HCL 4 MG/2ML IJ SOLN
INTRAMUSCULAR | Status: DC | PRN
  Administered 2013-07-19: 4 mg via INTRAVENOUS

## 2013-07-19 MED ORDER — LIDOCAINE HCL (CARDIAC) 20 MG/ML IV SOLN
INTRAVENOUS | Status: DC | PRN
  Administered 2013-07-19: 80 mg via INTRAVENOUS

## 2013-07-19 MED ORDER — PROPOFOL 10 MG/ML IV BOLUS
INTRAVENOUS | Status: DC | PRN
  Administered 2013-07-19: 200 mg via INTRAVENOUS

## 2013-07-19 MED ORDER — MIDAZOLAM HCL 2 MG/ML PO SYRP: 12.0000 mg | ORAL_SOLUTION | Freq: Once | ORAL | Status: DC | PRN

## 2013-07-19 MED ORDER — FENTANYL CITRATE 0.05 MG/ML IJ SOLN
INTRAMUSCULAR | Status: AC
  Filled 2013-07-19: qty 2

## 2013-07-19 MED ORDER — OXYCODONE HCL 5 MG PO TABS
ORAL_TABLET | ORAL | Status: AC
  Filled 2013-07-19: qty 1

## 2013-07-19 MED ORDER — OXYCODONE HCL 5 MG/5ML PO SOLN: 5.0000 mg | Freq: Once | ORAL | Status: AC | PRN

## 2013-07-19 MED ORDER — BUPIVACAINE HCL (PF) 0.25 % IJ SOLN
INTRAMUSCULAR | Status: DC | PRN
  Administered 2013-07-19: 20 mL via PERINEURAL

## 2013-07-19 MED ORDER — LACTATED RINGERS IV SOLN: INTRAVENOUS | Status: DC

## 2013-07-19 MED ORDER — HYDROMORPHONE HCL PF 1 MG/ML IJ SOLN: 0.2500 mg | INTRAMUSCULAR | Status: DC | PRN

## 2013-07-19 MED ORDER — BUPIVACAINE-EPINEPHRINE (PF) 0.5% -1:200000 IJ SOLN
INTRAMUSCULAR | Status: DC | PRN
Start: 2013-07-19 — End: 2013-07-19
  Administered 2013-07-19: 20 mL via PERINEURAL

## 2013-07-19 MED ORDER — CEFAZOLIN SODIUM-DEXTROSE 2-3 GM-% IV SOLR
INTRAVENOUS | Status: AC
  Filled 2013-07-19: qty 50

## 2013-07-19 MED ORDER — METOCLOPRAMIDE HCL 5 MG/ML IJ SOLN: 10.0000 mg | Freq: Once | INTRAMUSCULAR | Status: DC | PRN

## 2013-07-19 MED ORDER — LACTATED RINGERS IV SOLN
INTRAVENOUS | Status: DC
  Administered 2013-07-19: 09:00:00 via INTRAVENOUS

## 2013-07-19 MED ORDER — DEXAMETHASONE SODIUM PHOSPHATE 4 MG/ML IJ SOLN
INTRAMUSCULAR | Status: DC | PRN
  Administered 2013-07-19: 10 mg via INTRAVENOUS

## 2013-07-19 MED ORDER — OXYCODONE HCL 5 MG PO TABS
5.0000 mg | ORAL_TABLET | Freq: Once | ORAL | Status: AC | PRN
  Administered 2013-07-19: 5 mg via ORAL

## 2013-07-19 SURGICAL SUPPLY — 66 items
BANDAGE ELASTIC 4 VELCRO ST LF (GAUZE/BANDAGES/DRESSINGS) ×6 IMPLANT
BANDAGE ELASTIC 6 VELCRO ST LF (GAUZE/BANDAGES/DRESSINGS) ×3 IMPLANT
BANDAGE ESMARK 6X9 LF (GAUZE/BANDAGES/DRESSINGS) ×1 IMPLANT
BENZOIN TINCTURE PRP APPL 2/3 (GAUZE/BANDAGES/DRESSINGS) IMPLANT
BIT DRILL 2.5 CANN LNG (BIT) ×3 IMPLANT
BLADE SURG 15 STRL LF DISP TIS (BLADE) ×1 IMPLANT
BLADE SURG 15 STRL SS (BLADE) ×2
BNDG COHESIVE 4X5 TAN STRL (GAUZE/BANDAGES/DRESSINGS) ×3 IMPLANT
BNDG ESMARK 6X9 LF (GAUZE/BANDAGES/DRESSINGS) ×3
CANISTER SUCT 1200ML W/VALVE (MISCELLANEOUS) ×3 IMPLANT
CLOSURE WOUND 1/2 X4 (GAUZE/BANDAGES/DRESSINGS)
COVER TABLE BACK 60X90 (DRAPES) ×3 IMPLANT
CUFF TOURNIQUET SINGLE 34IN LL (TOURNIQUET CUFF) ×3 IMPLANT
DECANTER SPIKE VIAL GLASS SM (MISCELLANEOUS) IMPLANT
DRAPE EXTREMITY T 121X128X90 (DRAPE) ×3 IMPLANT
DRAPE OEC MINIVIEW 54X84 (DRAPES) ×3 IMPLANT
DRAPE U 20/CS (DRAPES) ×3 IMPLANT
DRAPE U-SHAPE 47X51 STRL (DRAPES) ×3 IMPLANT
DRSG PAD ABDOMINAL 8X10 ST (GAUZE/BANDAGES/DRESSINGS) ×3 IMPLANT
DURAPREP 26ML APPLICATOR (WOUND CARE) ×3 IMPLANT
ELECT REM PT RETURN 9FT ADLT (ELECTROSURGICAL) ×3
ELECTRODE REM PT RTRN 9FT ADLT (ELECTROSURGICAL) ×1 IMPLANT
GAUZE SPONGE 4X4 12PLY STRL (GAUZE/BANDAGES/DRESSINGS) ×3 IMPLANT
GAUZE XEROFORM 1X8 LF (GAUZE/BANDAGES/DRESSINGS) ×3 IMPLANT
GLOVE BIO SURGEON STRL SZ 6.5 (GLOVE) ×4 IMPLANT
GLOVE BIO SURGEONS STRL SZ 6.5 (GLOVE) ×2
GLOVE BIOGEL PI IND STRL 7.0 (GLOVE) ×3 IMPLANT
GLOVE BIOGEL PI INDICATOR 7.0 (GLOVE) ×6
GLOVE ECLIPSE 6.5 STRL STRAW (GLOVE) ×3 IMPLANT
GLOVE ORTHO TXT STRL SZ7.5 (GLOVE) ×6 IMPLANT
GOWN STRL REUS W/ TWL LRG LVL3 (GOWN DISPOSABLE) ×3 IMPLANT
GOWN STRL REUS W/ TWL XL LVL3 (GOWN DISPOSABLE) ×1 IMPLANT
GOWN STRL REUS W/TWL LRG LVL3 (GOWN DISPOSABLE) ×6
GOWN STRL REUS W/TWL XL LVL3 (GOWN DISPOSABLE) ×5 IMPLANT
NEEDLE HYPO 25X1 1.5 SAFETY (NEEDLE) IMPLANT
NS IRRIG 1000ML POUR BTL (IV SOLUTION) ×3 IMPLANT
PACK BASIN DAY SURGERY FS (CUSTOM PROCEDURE TRAY) ×3 IMPLANT
PAD CAST 4YDX4 CTTN HI CHSV (CAST SUPPLIES) ×4 IMPLANT
PADDING CAST COTTON 4X4 STRL (CAST SUPPLIES) ×8
PADDING CAST COTTON 6X4 STRL (CAST SUPPLIES) IMPLANT
PENCIL BUTTON HOLSTER BLD 10FT (ELECTRODE) ×3 IMPLANT
PLATE LOCKING STR 6HOLE (Plate) ×3 IMPLANT
REPAIR TROPE KNTLS SS SYNDESMO (Orthopedic Implant) ×6 IMPLANT
SCREW LOW PROFILE 3.5X16 (Screw) ×18 IMPLANT
SLEEVE SCD COMPRESS KNEE MED (MISCELLANEOUS) ×3 IMPLANT
SPLINT FAST PLASTER 5X30 (CAST SUPPLIES) ×40
SPLINT FIBERGLASS 4X30 (CAST SUPPLIES) IMPLANT
SPLINT PLASTER CAST FAST 5X30 (CAST SUPPLIES) ×20 IMPLANT
SPONGE LAP 4X18 X RAY DECT (DISPOSABLE) ×3 IMPLANT
STAPLER VISISTAT 35W (STAPLE) IMPLANT
STOCKINETTE 6  STRL (DRAPES) ×2
STOCKINETTE 6 STRL (DRAPES) ×1 IMPLANT
STRIP CLOSURE SKIN 1/2X4 (GAUZE/BANDAGES/DRESSINGS) IMPLANT
SUCTION FRAZIER TIP 10 FR DISP (SUCTIONS) IMPLANT
SUT ETHILON 3 0 PS 1 (SUTURE) ×9 IMPLANT
SUT VIC AB 0 CT1 27 (SUTURE) ×2
SUT VIC AB 0 CT1 27XBRD ANBCTR (SUTURE) ×1 IMPLANT
SUT VIC AB 2-0 SH 27 (SUTURE) ×4
SUT VIC AB 2-0 SH 27XBRD (SUTURE) ×2 IMPLANT
SUT VICRYL 4-0 PS2 18IN ABS (SUTURE) IMPLANT
SYR BULB 3OZ (MISCELLANEOUS) ×3 IMPLANT
SYR CONTROL 10ML LL (SYRINGE) ×3 IMPLANT
TUBE CONNECTING 20'X1/4 (TUBING) ×1
TUBE CONNECTING 20X1/4 (TUBING) ×2 IMPLANT
UNDERPAD 30X30 INCONTINENT (UNDERPADS AND DIAPERS) ×3 IMPLANT
YANKAUER SUCT BULB TIP NO VENT (SUCTIONS) IMPLANT

## 2013-07-19 NOTE — Anesthesia Preprocedure Evaluation (Signed)
Anesthesia Evaluation  Patient identified by MRN, date of birth, ID band Patient awake    Reviewed: Allergy & Precautions, H&P , NPO status , Patient's Chart, lab work & pertinent test results, reviewed documented beta blocker date and time   Airway Mallampati: II TM Distance: >3 FB Neck ROM: full    Dental   Pulmonary asthma ,  breath sounds clear to auscultation        Cardiovascular hypertension, On Medications negative cardio ROS  Rhythm:regular     Neuro/Psych negative neurological ROS  negative psych ROS   GI/Hepatic negative GI ROS, Neg liver ROS,   Endo/Other  negative endocrine ROS  Renal/GU negative Renal ROS  negative genitourinary   Musculoskeletal   Abdominal   Peds  Hematology negative hematology ROS (+)   Anesthesia Other Findings See surgeon's H&P   Reproductive/Obstetrics negative OB ROS                           Anesthesia Physical Anesthesia Plan  ASA: II  Anesthesia Plan: General   Post-op Pain Management:    Induction: Intravenous  Airway Management Planned: LMA  Additional Equipment:   Intra-op Plan:   Post-operative Plan:   Informed Consent: I have reviewed the patients History and Physical, chart, labs and discussed the procedure including the risks, benefits and alternatives for the proposed anesthesia with the patient or authorized representative who has indicated his/her understanding and acceptance.   Dental Advisory Given  Plan Discussed with: CRNA and Surgeon  Anesthesia Plan Comments:         Anesthesia Quick Evaluation

## 2013-07-19 NOTE — Anesthesia Postprocedure Evaluation (Signed)
  Anesthesia Post-op Note  Patient: Casey Mcmahon  Procedure(s) Performed: Procedure(s): LEFT ANKLE FRACTURE OPEN TREATMENT BIMALLEOLAR ANKLE INCLUDES INTERNAL FIXATION, LEFT ANKLE FRACTURE OPEN TREATMENT DISTAL TIBIOFIBULAR fasciotomy INCLUDES INTERNAL FIXATION repai of synosmosis (Left)  Patient Location: PACU  Anesthesia Type:GA combined with regional for post-op pain  Level of Consciousness: awake, alert  and oriented  Airway and Oxygen Therapy: Patient Spontanous Breathing  Post-op Pain: mild  Post-op Assessment: Post-op Vital signs reviewed  Post-op Vital Signs: Reviewed  Last Vitals:  Filed Vitals:   07/19/13 1215  BP: 122/75  Pulse: 74  Temp:   Resp: 18    Complications: No apparent anesthesia complications

## 2013-07-19 NOTE — Progress Notes (Signed)
Assisted Dr. Frederick with left, ultrasound guided, popliteal/saphenous block. Side rails up, monitors on throughout procedure. See vital signs in flow sheet. Tolerated Procedure well. 

## 2013-07-19 NOTE — Transfer of Care (Signed)
Immediate Anesthesia Transfer of Care Note  Patient: Casey Mcmahon  Procedure(s) Performed: Procedure(s): LEFT ANKLE FRACTURE OPEN TREATMENT BIMALLEOLAR ANKLE INCLUDES INTERNAL FIXATION, LEFT ANKLE FRACTURE OPEN TREATMENT DISTAL TIBIOFIBULAR fasciotomy INCLUDES INTERNAL FIXATION repai of synosmosis (Left)  Patient Location: PACU  Anesthesia Type:General  Level of Consciousness: awake and sedated  Airway & Oxygen Therapy: Patient Spontanous Breathing and Patient connected to face mask oxygen  Post-op Assessment: Report given to PACU RN and Post -op Vital signs reviewed and stable  Post vital signs: Reviewed and stable  Complications: No apparent anesthesia complications

## 2013-07-19 NOTE — Interval H&P Note (Signed)
History and Physical Interval Note:  07/19/2013 7:24 AM  Casey Mcmahon  has presented today for surgery, with the diagnosis of LEFT ANKLE FRACTURE ANKLE BIMALLEOLAR-CLOSED FRACTURE ANKLE UNSPECIFIED -CLOSED   The various methods of treatment have been discussed with the patient and family. After consideration of risks, benefits and other options for treatment, the patient has consented to  Procedure(s): LEFT ANKLE FRACTURE OPEN TREATMENT BIMALLEOLAR ANKLE INCLUDES INTERNAL FIXATION, LEFT ANKLE FRACTURE OPEN TREATMENT DISTAL TIBIOFIBULAR INCLUDES INTERNAL FIXATION  (Left) as a surgical intervention .  The patient's history has been reviewed, patient examined, no change in status, stable for surgery.  I have reviewed the patient's chart and labs.  Questions were answered to the patient's satisfaction.     MURPHY,DANIEL F

## 2013-07-19 NOTE — Anesthesia Procedure Notes (Addendum)
Anesthesia Regional Block:  Popliteal block  Pre-Anesthetic Checklist: ,, timeout performed, Correct Patient, Correct Site, Correct Laterality, Correct Procedure, Correct Position, site marked, Risks and benefits discussed,  Surgical consent,  Pre-op evaluation,  At surgeon's request and post-op pain management  Laterality: Left  Prep: chloraprep       Needles:   Needle Type: Other     Needle Length: 9cm 9 cm Needle Gauge: 21 and 21 G    Additional Needles:  Procedures: ultrasound guided (picture in chart) Popliteal block Narrative:  Start time: 07/19/2013 9:21 AM End time: 07/19/2013 9:31 AM Injection made incrementally with aspirations every 5 mL.  Performed by: Personally  Anesthesiologist: Aldona Lento, MD  Additional Notes: Ultrasound guidance used to: id relevant anatomy, confirm needle position, local anesthetic spread, avoidance of vascular puncture. Picture saved. No complications. Block performed personally by Janetta Hora. Gelene Mink, MD  .     Procedure Name: LMA Insertion Performed by: York Grice Pre-anesthesia Checklist: Patient identified, Timeout performed, Emergency Drugs available, Suction available and Patient being monitored Patient Re-evaluated:Patient Re-evaluated prior to inductionOxygen Delivery Method: Circle system utilized Preoxygenation: Pre-oxygenation with 100% oxygen Intubation Type: IV induction Ventilation: Mask ventilation without difficulty LMA: LMA with gastric port inserted LMA Size: 4.0 Tube type: Oral Number of attempts: 1 Placement Confirmation: positive ETCO2 Tube secured with: Tape Dental Injury: Teeth and Oropharynx as per pre-operative assessment

## 2013-07-19 NOTE — Discharge Instructions (Signed)
Ankle Fracture (ORIF), Care After Read the instructions outlined below and refer to this sheet in the next few weeks. These discharge instructions provide you with general information on caring for yourself after you leave the hospital. Your doctor may also give you specific instructions. While your treatment has been planned according to the most current medical practices available, unavoidable complications occasionally occur. If you have any problems or questions after discharge, please call your caregiver. HOME CARE INSTRUCTIONS Non-weight bearing.  Do not remove splint.  May shower but do not let splint get wet.  Follow up appointment in one week.  You may resume normal diet and activities as directed or allowed. Use crutches as instructed.  Keep ice packs (a bag of ice wrapped in a towel) on the surgical area for 15-20 minutes, 03-04 times per day, for the first two days following surgery. Use the ice only if OK with your surgeon or caregiver.  Change dressings if necessary or as directed.  If you have a plaster or fiberglass splint or cast:  Do not try to scratch the skin under the cast using sharp or pointed objects.  Check the skin around the cast every day. You may put lotion on any red or sore areas.  Keep your cast or splint dry and clean.  Do not put pressure on any part of your cast or splint until it is fully hardened.  Your cast or splint can be protected during bathing with a plastic bag. Do not lower the cast or splint into water.  Take prescribed medication as directed. Only take over-the-counter or prescription medicines for pain, discomfort, or fever as directed by your caregiver.  Use crutches as directed and do not exercise leg unless instructed.  These are not fractures to be taken lightly! If the fracture displaces and gets out of position, it may eventually lead to arthritis and disability for the rest of your life. Problems often follow even the best of  care.  Follow all instructions given to you by your caregiver, make and keep follow up appointments. SEEK IMMEDIATE MEDICAL CARE IF:  You develop redness, swelling, numbness or increasing pain in the wound.  There is pus coming from the wound.  An unexplained oral temperature above 102 F (38.9 C) develops.  A bad smell is coming from the wound or dressing.  A breaking open of the wound (edges not staying together) occurs after stitches or staples have been removed. If you do not have a window in your cast for observing the wound, a discharge or minor bleeding may show up as a stain on the outside of your cast immediately after surgery. Report these findings to your caregiver. Document Released: 08/14/2004 Document Revised: 11/15/2012 Document Reviewed: 08/06/2008 [facility] Patient Information 2014 [place], [place].   Post Anesthesia Home Care Instructions  Activity: Get plenty of rest for the remainder of the day. A responsible adult should stay with you for 24 hours following the procedure.  For the next 24 hours, DO NOT: -Drive a car -Advertising copywriter -Drink alcoholic beverages -Take any medication unless instructed by your physician -Make any legal decisions or sign important papers.  Meals: Start with liquid foods such as gelatin or soup. Progress to regular foods as tolerated. Avoid greasy, spicy, heavy foods. If nausea and/or vomiting occur, drink only clear liquids until the nausea and/or vomiting subsides. Call your physician if vomiting continues.  Special Instructions/Symptoms: Your throat may feel dry or sore from the anesthesia or the breathing tube  placed in your throat during surgery. If this causes discomfort, gargle with warm salt water. The discomfort should disappear within 24 hours.  Regional Anesthesia Blocks  1. Numbness or the inability to move the "blocked" extremity may last from 3-48 hours after placement. The length of time depends on the medication  injected and your individual response to the medication. If the numbness is not going away after 48 hours, call your surgeon.  2. The extremity that is blocked will need to be protected until the numbness is gone and the  Strength has returned. Because you cannot feel it, you will need to take extra care to avoid injury. Because it may be weak, you may have difficulty moving it or using it. You may not know what position it is in without looking at it while the block is in effect.  3. For blocks in the legs and feet, returning to weight bearing and walking needs to be done carefully. You will need to wait until the numbness is entirely gone and the strength has returned. You should be able to move your leg and foot normally before you try and bear weight or walk. You will need someone to be with you when you first try to ensure you do not fall and possibly risk injury.  4. Bruising and tenderness at the needle site are common side effects and will resolve in a few days.  5. Persistent numbness or new problems with movement should be communicated to the surgeon or the [facility] [phone])/ [facility] [place] [phone]).

## 2013-07-23 ENCOUNTER — Encounter (HOSPITAL_BASED_OUTPATIENT_CLINIC_OR_DEPARTMENT_OTHER): Payer: Self-pay | Admitting: Orthopedic Surgery

## 2013-07-23 NOTE — Op Note (Signed)
NAME:  Casey Mcmahon, Casey                    ACCOUNT NO.:  MEDICAL RECORD NO.:  [id number]  LOCATION:                                 FACILITY:  PHYSICIAN:  Loreta Aveaniel F. Shayli Altemose, M.D. DATE OF BIRTH:  February 26, 1985  DATE OF PROCEDURE:  07/19/2013 DATE OF DISCHARGE:                              OPERATIVE REPORT   PREOPERATIVE DIAGNOSES:  Left ankle injury.  High fibular shaft ankle fracture.  Complete disruption of distal tib-fib ligament.  Complete tear of deltoid ligament.  Posterior malleolus fracture.  POSTOPERATIVE DIAGNOSES:  Left ankle injury.  High fibular shaft ankle fracture.  Complete disruption of distal tib-fib ligament.  Complete tear of deltoid ligament.  Posterior malleolus fracture.  PROCEDURES:  Left ankle exam under anesthesia with fluoroscopic stress views.  Open reduction and internal fixation of fibular shaft fracture with a 6-hole Arthrex plate and screws.  Release of lateral compartments subcutaneously.  Repair of syndesmosis with Arthrex TightRopes x2. Closed treatment of posterior malleolus fracture and deltoid ligament tear with reduction of ankle.  SURGEON:  Loreta Aveaniel F. Raif Chachere, M.D.  ASSISTANT:  Rayfield CitizenLindsay Anton, PA, present throughout the entire case and necessary for timely completion of procedure.  ANESTHESIA:  General.  BLOOD LOSS:  Minimal.  SPECIMENS:  None.  CULTURES:  None.  COMPLICATIONS:  None.  DRESSINGS:  Soft compressive short leg splint.  TOURNIQUET TIME:  1 hour.  DESCRIPTION OF PROCEDURE:  The patient was brought to the operating room, placed on the operating table in supine position.  After adequate anesthesia had been obtained, the ankle was examined with fluoroscopic stress views.  Markedly opened with external rotation, with displacement of the ankle mortise as well as distal tib-fib joint and fibular fracture.  This could be reduced closed in acceptable position.  The leg was prepped and draped in usual sterile fashion.  Exsanguinated  with elevation of Esmarch.  Tourniquet was inflated to 300 mmHg.  Attention was first turned to the fibula.  Longitudinal incision over the fracture, which was about 25% of the way up the leg, but still consistent with a part of the ankle fracture, not a more proximal shaft fracture.  Skin and subcutaneous tissue were divided.  Subperiosteal exposure of the fracture itself.  This reduced anatomically and fixed with a 6-hole compression plate and screws.  Good alignment, good stability, and good fixation.  This was high enough off of the compartment.  Then, I elected to do a compartment release subcutaneously proximal and distal to prevent complications from swelling postop.  Once the fibular shaft fracture was reduced, ankle stability and syndesmosis stability were better, but not anatomic.  With a small lateral incision, I used first guidewire, then drill and placement of two TightRope devices from lateral to medial, placing them slightly divergent and angle and one above the other.  Once these were in good position, the foot was dorsiflexed, ankle reduced and they were tightened down and sutures cut off.  This gave great stability syndesmosis with anatomic alignment of the mortise.  Once that was complete, posterior malleolus fracture was anatomically aligned, required no further fixation.  Medial side of the ankle was closed and I  did not need to open that any further.  Wounds were all irrigated.  Closed with Vicryl and nylon. Sterile compressive dressing applied.  Short-leg splint applied. Tourniquet was deflated and removed.  Anesthesia was reversed.  Brought to the recovery room.  Tolerated the surgery well.  No complications.     Loreta Aveaniel F. Wylie Russon, M.D.     DFM/MEDQ  D:  07/20/2013  T:  07/21/2013  Job:  [id number]

## 2014-01-08 DIAGNOSIS — M23329 Other meniscus derangements, posterior horn of medial meniscus, unspecified knee: Secondary | ICD-10-CM

## 2014-01-08 DIAGNOSIS — S83512A Sprain of anterior cruciate ligament of left knee, initial encounter: Secondary | ICD-10-CM

## 2014-01-08 HISTORY — DX: Other meniscus derangements, posterior horn of medial meniscus, unspecified knee: M23.329

## 2014-01-08 HISTORY — DX: Sprain of anterior cruciate ligament of left knee, initial encounter: S83.512A

## 2014-01-24 ENCOUNTER — Encounter (HOSPITAL_BASED_OUTPATIENT_CLINIC_OR_DEPARTMENT_OTHER): Payer: Self-pay | Admitting: *Deleted

## 2014-01-24 NOTE — Pre-Procedure Instructions (Signed)
To come for BMET 

## 2014-01-25 ENCOUNTER — Encounter (HOSPITAL_BASED_OUTPATIENT_CLINIC_OR_DEPARTMENT_OTHER)
Admission: RE | Admit: 2014-01-25 | Discharge: 2014-01-25 | Disposition: A | Discharge status: Home / Self Care | Payer: 59 | Source: Ambulatory Visit | Attending: Orthopedic Surgery | Admitting: Orthopedic Surgery

## 2014-01-25 ENCOUNTER — Other Ambulatory Visit: Payer: Self-pay | Admitting: Physician Assistant

## 2014-01-25 DIAGNOSIS — I10 Essential (primary) hypertension: Secondary | ICD-10-CM | POA: Diagnosis not present

## 2014-01-25 DIAGNOSIS — M23612 Other spontaneous disruption of anterior cruciate ligament of left knee: Secondary | ICD-10-CM | POA: Diagnosis not present

## 2014-01-25 DIAGNOSIS — K219 Gastro-esophageal reflux disease without esophagitis: Secondary | ICD-10-CM | POA: Diagnosis not present

## 2014-01-25 DIAGNOSIS — M94262 Chondromalacia, left knee: Secondary | ICD-10-CM | POA: Diagnosis not present

## 2014-01-25 DIAGNOSIS — S83212A Bucket-handle tear of medial meniscus, current injury, left knee, initial encounter: Secondary | ICD-10-CM | POA: Diagnosis not present

## 2014-01-25 LAB — BASIC METABOLIC PANEL
Anion gap: 10 (ref 5–15)
BUN: 11 mg/dL (ref 6–23)
CHLORIDE: 104 meq/L (ref 96–112)
CO2: 24 mEq/L (ref 19–32)
CREATININE: 0.74 mg/dL (ref 0.50–1.10)
Calcium: 9 mg/dL (ref 8.4–10.5)
GFR calc non Af Amer: >90 mL/min (ref >90)
Glucose, Bld: 91 mg/dL (ref 70–99)
POTASSIUM: 4.6 meq/L (ref 3.7–5.3)
Sodium: 138 mEq/L (ref 137–147)

## 2014-01-25 NOTE — H&P (Signed)
  [facility] Rasnick/[facility] [address]   [address] [place], [place] [id number] [phone] A Division of [facility]  Loreta Aveaniel F. Etan Vasudevan, M.D.   Robert A. Thurston HoleWainer, M.D.   Burnell BlanksW. Dan Caffrey, M.D.   Eulas PostJoshua P. Landau, M.D.   Lunette StandsAnna Voytek, M.D Jewel Baizeimothy D. Eulah PontMurphy, M.D.  Buford DresserWesley R. Ibazebo, M.D.  Estell HarpinJames S. Kramer, M.D.    Melina Fiddlerebecca S. Bassett, M.D. Mary L. Isidoro DonningAnton, PA-C  Kirstin A. Shepperson, PA-C  Josh Farmvillehadwell, PA-C [place]andon Par[place], [place]   RE: Casey BandyCurtis, Casey Mcmahon                                [phone]      DOB: 04/30/1985 PROGRESS NOTE: 01-22-14 Casey Mcmahon comes in for her left knee.  This is not something I have addressed previously.  I took care of her earlier this year for a left ankle fracture.  This was repaired and she has done well.  It has gone on to heal and she has returned to full activity.  Her left knee is actually a chronic issue.  Initial pivoting event probably in 2012.  Increasing symptoms evaluated by Dr. Jerl Santosalldorf in 2014.  At that time MRI showing chronic ACL tear with a bucket-handle tear medial meniscus.  Surgery discussed, but she never proceeded.  Over the years this has gotten worse rather than better.  She is now having giving way with daily living.  Marked mechanical symptoms medially.  She realizes she has to do something at this point in time, as things are more and more intolerable.   Entire history and general exam is reviewed.  I have looked at her previous MRI from 2014, as well as Dr. Nolon Nationsalldorf's notes.     EXAMINATION: Specifically, she has an obvious ACL deficient knee on the left.  Lacks full extension by a couple of degrees.  Positive medial McMurray's.  There is some mild quad atrophy on the left, not too extreme.  Other ligaments are stable.  Neurovascularly intact distally.  Right knee has full motion and good stability.  No swelling.    X-RAYS: Four view standing x-rays show that she has not progressed with any significant  degenerative change.  Obviously no acute bony findings.    DISPOSITION:  More than 25 minutes spent face-to-face talking with Casey ReiningNicole.  Need for treatment straightforward.  I don't think repeating her MRI is going to be helpful.  We have discussed exam under anesthesia, arthroscopy.  Probable partial medial meniscectomy, I doubt repair is going to be an option now.  Arthroscopic endoscopic reconstruction anterior tib allograft.  What to expect intra and post-op.  Her degree of recovery, rehab and getting her strength back also thoroughly outlined.  She would like to proceed.  All questions answered.  I will see her at the time of operative intervention.    Loreta Aveaniel F. Traci Plemons, M.D.   Electronically verified by Loreta Aveaniel F. Mae Cianci, M.D. DFM:jjh D 01-22-14 T 01-23-14

## 2014-01-28 ENCOUNTER — Encounter (HOSPITAL_BASED_OUTPATIENT_CLINIC_OR_DEPARTMENT_OTHER)
Admission: RE | Disposition: A | Discharge status: Home / Self Care | Payer: Self-pay | Source: Ambulatory Visit | Attending: Orthopedic Surgery

## 2014-01-28 ENCOUNTER — Encounter (HOSPITAL_BASED_OUTPATIENT_CLINIC_OR_DEPARTMENT_OTHER): Payer: Self-pay | Admitting: Anesthesiology

## 2014-01-28 ENCOUNTER — Ambulatory Visit (HOSPITAL_BASED_OUTPATIENT_CLINIC_OR_DEPARTMENT_OTHER): Payer: 59 | Admitting: Anesthesiology

## 2014-01-28 ENCOUNTER — Ambulatory Visit (HOSPITAL_BASED_OUTPATIENT_CLINIC_OR_DEPARTMENT_OTHER)
Admission: RE | Admit: 2014-01-28 | Discharge: 2014-01-28 | Disposition: A | Discharge status: Home / Self Care | Payer: 59 | Source: Ambulatory Visit | Attending: Orthopedic Surgery | Admitting: Orthopedic Surgery

## 2014-01-28 DIAGNOSIS — M23612 Other spontaneous disruption of anterior cruciate ligament of left knee: Secondary | ICD-10-CM | POA: Insufficient documentation

## 2014-01-28 DIAGNOSIS — I10 Essential (primary) hypertension: Secondary | ICD-10-CM | POA: Insufficient documentation

## 2014-01-28 DIAGNOSIS — K219 Gastro-esophageal reflux disease without esophagitis: Secondary | ICD-10-CM | POA: Insufficient documentation

## 2014-01-28 DIAGNOSIS — S83212A Bucket-handle tear of medial meniscus, current injury, left knee, initial encounter: Secondary | ICD-10-CM | POA: Insufficient documentation

## 2014-01-28 DIAGNOSIS — M94262 Chondromalacia, left knee: Secondary | ICD-10-CM | POA: Insufficient documentation

## 2014-01-28 HISTORY — DX: Other meniscus derangements, posterior horn of medial meniscus, unspecified knee: M23.329

## 2014-01-28 HISTORY — PX: KNEE ARTHROSCOPY WITH ANTERIOR CRUCIATE LIGAMENT (ACL) REPAIR: SHX5644

## 2014-01-28 HISTORY — DX: Migraine, unspecified, not intractable, without status migrainosus: G43.909

## 2014-01-28 HISTORY — DX: Sprain of anterior cruciate ligament of left knee, initial encounter: S83.512A

## 2014-01-28 LAB — POCT HEMOGLOBIN-HEMACUE: Hemoglobin: 13.2 g/dL (ref 12.0–15.0)

## 2014-01-28 SURGERY — KNEE ARTHROSCOPY WITH ANTERIOR CRUCIATE LIGAMENT (ACL) REPAIR
Anesthesia: General | Site: Knee | Laterality: Left

## 2014-01-28 MED ORDER — CEFAZOLIN SODIUM 1-5 GM-% IV SOLN
INTRAVENOUS | Status: AC
  Filled 2014-01-28: qty 100

## 2014-01-28 MED ORDER — LACTATED RINGERS IV SOLN: INTRAVENOUS | Status: DC

## 2014-01-28 MED ORDER — FENTANYL CITRATE 0.05 MG/ML IJ SOLN
50.0000 ug | INTRAMUSCULAR | Status: DC | PRN
  Administered 2014-01-28: 100 ug via INTRAVENOUS

## 2014-01-28 MED ORDER — PROPOFOL 10 MG/ML IV BOLUS
INTRAVENOUS | Status: DC | PRN
  Administered 2014-01-28: 200 mg via INTRAVENOUS

## 2014-01-28 MED ORDER — HYDROMORPHONE HCL 1 MG/ML IJ SOLN
INTRAMUSCULAR | Status: AC
  Filled 2014-01-28: qty 1

## 2014-01-28 MED ORDER — LACTATED RINGERS IV SOLN
INTRAVENOUS | Status: DC
  Administered 2014-01-28 (×2): via INTRAVENOUS

## 2014-01-28 MED ORDER — CEFAZOLIN SODIUM-DEXTROSE 2-3 GM-% IV SOLR
2.0000 g | INTRAVENOUS | Status: AC
  Administered 2014-01-28: 2 g via INTRAVENOUS

## 2014-01-28 MED ORDER — FENTANYL CITRATE 0.05 MG/ML IJ SOLN
INTRAMUSCULAR | Status: DC | PRN
  Administered 2014-01-28 (×3): 50 ug via INTRAVENOUS
  Administered 2014-01-28 (×2): 25 ug via INTRAVENOUS
  Administered 2014-01-28 (×2): 50 ug via INTRAVENOUS

## 2014-01-28 MED ORDER — MIDAZOLAM HCL 2 MG/2ML IJ SOLN
1.0000 mg | INTRAMUSCULAR | Status: DC | PRN
  Administered 2014-01-28: 2 mg via INTRAVENOUS

## 2014-01-28 MED ORDER — LIDOCAINE HCL (CARDIAC) 20 MG/ML IV SOLN
INTRAVENOUS | Status: DC | PRN
  Administered 2014-01-28: 100 mg via INTRAVENOUS

## 2014-01-28 MED ORDER — SCOPOLAMINE 1 MG/3DAYS TD PT72: 1.0000 | MEDICATED_PATCH | TRANSDERMAL | Status: DC

## 2014-01-28 MED ORDER — BUPIVACAINE-EPINEPHRINE (PF) 0.5% -1:200000 IJ SOLN
INTRAMUSCULAR | Status: AC
Start: 2014-01-28 — End: 2014-01-28
  Filled 2014-01-28: qty 30

## 2014-01-28 MED ORDER — CHLORHEXIDINE GLUCONATE 4 % EX LIQD: 60.0000 mL | Freq: Once | CUTANEOUS | Status: DC

## 2014-01-28 MED ORDER — MIDAZOLAM HCL 2 MG/ML PO SYRP: 12.0000 mg | ORAL_SOLUTION | Freq: Once | ORAL | Status: DC | PRN

## 2014-01-28 MED ORDER — FENTANYL CITRATE 0.05 MG/ML IJ SOLN
INTRAMUSCULAR | Status: AC
  Filled 2014-01-28: qty 2

## 2014-01-28 MED ORDER — HYDROMORPHONE HCL 1 MG/ML IJ SOLN
0.2500 mg | INTRAMUSCULAR | Status: DC | PRN
  Administered 2014-01-28 (×2): 0.5 mg via INTRAVENOUS

## 2014-01-28 MED ORDER — BUPIVACAINE-EPINEPHRINE (PF) 0.5% -1:200000 IJ SOLN
INTRAMUSCULAR | Status: DC | PRN
  Administered 2014-01-28: 25 mL via PERINEURAL

## 2014-01-28 MED ORDER — MIDAZOLAM HCL 2 MG/2ML IJ SOLN
INTRAMUSCULAR | Status: AC
  Filled 2014-01-28: qty 2

## 2014-01-28 MED ORDER — OXYCODONE HCL 5 MG PO TABS
ORAL_TABLET | ORAL | Status: AC
  Filled 2014-01-28: qty 1

## 2014-01-28 MED ORDER — ONDANSETRON HCL 4 MG PO TABS: 4.0000 mg | ORAL_TABLET | Freq: Three times a day (TID) | ORAL | Status: DC | PRN

## 2014-01-28 MED ORDER — DEXAMETHASONE SODIUM PHOSPHATE 10 MG/ML IJ SOLN
INTRAMUSCULAR | Status: DC | PRN
  Administered 2014-01-28: 10 mg via INTRAVENOUS

## 2014-01-28 MED ORDER — OXYCODONE-ACETAMINOPHEN 5-325 MG PO TABS: 1.0000 | ORAL_TABLET | ORAL | Status: DC | PRN

## 2014-01-28 MED ORDER — MIDAZOLAM HCL 5 MG/5ML IJ SOLN
INTRAMUSCULAR | Status: DC | PRN
  Administered 2014-01-28: 2 mg via INTRAVENOUS

## 2014-01-28 MED ORDER — BUPIVACAINE-EPINEPHRINE (PF) 0.25% -1:200000 IJ SOLN
INTRAMUSCULAR | Status: AC
  Filled 2014-01-28: qty 30

## 2014-01-28 MED ORDER — OXYCODONE HCL 5 MG PO TABS
5.0000 mg | ORAL_TABLET | Freq: Once | ORAL | Status: AC
Start: 2014-01-28 — End: 2014-01-28
  Administered 2014-01-28: 5 mg via ORAL

## 2014-01-28 MED ORDER — ONDANSETRON HCL 4 MG/2ML IJ SOLN
INTRAMUSCULAR | Status: DC | PRN
  Administered 2014-01-28: 4 mg via INTRAVENOUS

## 2014-01-28 MED ORDER — FENTANYL CITRATE 0.05 MG/ML IJ SOLN
INTRAMUSCULAR | Status: AC
  Filled 2014-01-28: qty 4

## 2014-01-28 MED ORDER — SODIUM CHLORIDE 0.9 % IR SOLN
Status: DC | PRN
  Administered 2014-01-28: 11000 mL

## 2014-01-28 MED ORDER — ONDANSETRON HCL 4 MG/2ML IJ SOLN: 4.0000 mg | Freq: Once | INTRAMUSCULAR | Status: DC | PRN

## 2014-01-28 SURGICAL SUPPLY — 89 items
ANCHOR BUTTON TIGHTROPE ABS (Orthopedic Implant) ×3 IMPLANT
ANCHOR PUSHLOCK PEEK 3.5X19.5 (Anchor) ×3 IMPLANT
BANDAGE ELASTIC 4 VELCRO ST LF (GAUZE/BANDAGES/DRESSINGS) ×3 IMPLANT
BANDAGE ELASTIC 6 VELCRO ST LF (GAUZE/BANDAGES/DRESSINGS) ×3 IMPLANT
BANDAGE ESMARK 6X9 LF (GAUZE/BANDAGES/DRESSINGS) ×1 IMPLANT
BENZOIN TINCTURE PRP APPL 2/3 (GAUZE/BANDAGES/DRESSINGS) ×3 IMPLANT
BIT DRILL QC 3.5X195 (BIT) IMPLANT
BLADE 4.2CUDA (BLADE) IMPLANT
BLADE AVERAGE 25MMX9MM (BLADE) ×1
BLADE AVERAGE 25X9 (BLADE) ×2 IMPLANT
BLADE CUDA 5.5 (BLADE) IMPLANT
BLADE CUDA GRT WHITE 3.5 (BLADE) IMPLANT
BLADE CUTTER GATOR 3.5 (BLADE) ×3 IMPLANT
BLADE CUTTER MENIS 5.5 (BLADE) IMPLANT
BLADE GREAT WHITE 4.2 (BLADE) ×2 IMPLANT
BLADE GREAT WHITE 4.2MM (BLADE) ×1
BLADE SURG 15 STRL LF DISP TIS (BLADE) ×1 IMPLANT
BLADE SURG 15 STRL SS (BLADE) ×2
BNDG ESMARK 6X9 LF (GAUZE/BANDAGES/DRESSINGS) ×3
BUR OVAL 6.0 (BURR) ×3 IMPLANT
CANISTER SUCT 3000ML (MISCELLANEOUS) ×12 IMPLANT
CLOSURE WOUND 1/2 X4 (GAUZE/BANDAGES/DRESSINGS) ×1
COVER BACK TABLE 60X90IN (DRAPES) ×3 IMPLANT
CUFF TOURNIQUET SINGLE 34IN LL (TOURNIQUET CUFF) IMPLANT
CUTTER MENISCUS  4.2MM (BLADE)
CUTTER MENISCUS 4.2MM (BLADE) IMPLANT
DECANTER SPIKE VIAL GLASS SM (MISCELLANEOUS) IMPLANT
DRAPE ARTHROSCOPY W/POUCH 114 (DRAPES) ×3 IMPLANT
DRAPE U-SHAPE 47X51 STRL (DRAPES) ×3 IMPLANT
DURAPREP 26ML APPLICATOR (WOUND CARE) ×3 IMPLANT
ELECT MENISCUS 165MM 90D (ELECTRODE) IMPLANT
ELECT REM PT RETURN 9FT ADLT (ELECTROSURGICAL) ×3
ELECTRODE REM PT RTRN 9FT ADLT (ELECTROSURGICAL) ×1 IMPLANT
GAUZE SPONGE 4X4 12PLY STRL (GAUZE/BANDAGES/DRESSINGS) ×3 IMPLANT
GAUZE XEROFORM 1X8 LF (GAUZE/BANDAGES/DRESSINGS) ×3 IMPLANT
GLOVE BIO SURGEON STRL SZ 6.5 (GLOVE) ×2 IMPLANT
GLOVE BIO SURGEONS STRL SZ 6.5 (GLOVE) ×1
GLOVE BIOGEL M STRL SZ7.5 (GLOVE) ×3 IMPLANT
GLOVE BIOGEL PI IND STRL 7.0 (GLOVE) ×1 IMPLANT
GLOVE BIOGEL PI IND STRL 8 (GLOVE) ×1 IMPLANT
GLOVE BIOGEL PI INDICATOR 7.0 (GLOVE) ×2
GLOVE BIOGEL PI INDICATOR 8 (GLOVE) ×2
GLOVE ECLIPSE 7.0 STRL STRAW (GLOVE) ×3 IMPLANT
GLOVE ORTHO TXT STRL SZ7.5 (GLOVE) ×6 IMPLANT
GOWN STRL REUS W/ TWL LRG LVL3 (GOWN DISPOSABLE) ×2 IMPLANT
GOWN STRL REUS W/ TWL XL LVL3 (GOWN DISPOSABLE) ×1 IMPLANT
GOWN STRL REUS W/TWL LRG LVL3 (GOWN DISPOSABLE) ×4
GOWN STRL REUS W/TWL XL LVL3 (GOWN DISPOSABLE) ×2
IMMOBILIZER KNEE 22 UNIV (SOFTGOODS) IMPLANT
IMMOBILIZER KNEE 24 THIGH 36 (MISCELLANEOUS) ×1 IMPLANT
IMMOBILIZER KNEE 24 UNIV (MISCELLANEOUS) ×3
IV NS IRRIG 3000ML ARTHROMATIC (IV SOLUTION) ×12 IMPLANT
KNEE WRAP E Z 3 GEL PACK (MISCELLANEOUS) ×3 IMPLANT
KNIFE GRAFT ACL 10MM 5952 (MISCELLANEOUS) IMPLANT
KNIFE GRAFT ACL 9MM (MISCELLANEOUS) IMPLANT
MANIFOLD NEPTUNE II (INSTRUMENTS) ×3 IMPLANT
NS IRRIG 1000ML POUR BTL (IV SOLUTION) ×3 IMPLANT
PACK ARTHROSCOPY DSU (CUSTOM PROCEDURE TRAY) ×3 IMPLANT
PACK BASIN DAY SURGERY FS (CUSTOM PROCEDURE TRAY) ×3 IMPLANT
PAD CAST 4YDX4 CTTN HI CHSV (CAST SUPPLIES) ×1 IMPLANT
PADDING CAST COTTON 4X4 STRL (CAST SUPPLIES) ×2
PADDING CAST COTTON 6X4 STRL (CAST SUPPLIES) IMPLANT
PASSER SUT SWANSON 36MM LOOP (INSTRUMENTS) ×3 IMPLANT
PENCIL BUTTON HOLSTER BLD 10FT (ELECTRODE) ×3 IMPLANT
PIN DRILL ACL TIGHTROPE 4MM (PIN) ×3 IMPLANT
PUSHLOCK PEEK 4.5X24 (Orthopedic Implant) IMPLANT
SCREW BIO INTER 9X28 (Screw) ×3 IMPLANT
SET ARTHROSCOPY TUBING (MISCELLANEOUS) ×2
SET ARTHROSCOPY TUBING LN (MISCELLANEOUS) ×1 IMPLANT
SLEEVE SCD COMPRESS KNEE MED (MISCELLANEOUS) ×3 IMPLANT
SPONGE LAP 4X18 X RAY DECT (DISPOSABLE) ×3 IMPLANT
STRIP CLOSURE SKIN 1/2X4 (GAUZE/BANDAGES/DRESSINGS) ×2 IMPLANT
SUCTION FRAZIER TIP 10 FR DISP (SUCTIONS) IMPLANT
SUT 2 FIBERLOOP 20 STRT BLUE (SUTURE) ×3
SUT ETHILON 3 0 PS 1 (SUTURE) ×3 IMPLANT
SUT FIBERWIRE #2 38 T-5 BLUE (SUTURE) ×12
SUT VIC AB 0 CT1 27 (SUTURE)
SUT VIC AB 0 CT1 27XBRD ANBCTR (SUTURE) IMPLANT
SUT VIC AB 2-0 SH 27 (SUTURE) ×2
SUT VIC AB 2-0 SH 27XBRD (SUTURE) ×1 IMPLANT
SUT VIC AB 3-0 SH 27 (SUTURE) ×2
SUT VIC AB 3-0 SH 27X BRD (SUTURE) ×1 IMPLANT
SUT VICRYL 4-0 PS2 18IN ABS (SUTURE) IMPLANT
SUTURE 2 FIBERLOOP 20 STRT BLU (SUTURE) ×1 IMPLANT
SUTURE FIBERWR #2 38 T-5 BLUE (SUTURE) ×4 IMPLANT
TENDON ANTERIOR TIBIALIS (Tissue) ×3 IMPLANT
TOWEL OR 17X24 6PK STRL BLUE (TOWEL DISPOSABLE) ×3 IMPLANT
TOWEL OR NON WOVEN STRL DISP B (DISPOSABLE) ×3 IMPLANT
WATER STERILE IRR 1000ML POUR (IV SOLUTION) ×3 IMPLANT

## 2014-01-28 NOTE — Progress Notes (Signed)
Assisted Dr. Crews with left, ultrasound guided, adductor canal block. Side rails up, monitors on throughout procedure. See vital signs in flow sheet. Tolerated Procedure well. 

## 2014-01-28 NOTE — Discharge Instructions (Signed)
Anterior Cruciate Ligament Reconstruction, Care After Refer to this sheet in the next few weeks. These instructions provide you with information on caring for yourself after your procedure. Your health care provider may also give you specific instructions. Your treatment has been planned according to current medical practices, but problems sometimes occur. Call your health care provider if you have any problems or questions after your procedure. HOME CARE INSTRUCTIONS  Non-weight bearing.  Must be in knee immobilizer at all times unless in cpm or doing exercises.  Change bandages daily starting on Thursday.  May shower on Thursday but do not soak incisions.  May apply ice for up to 20 minutes at a time for pain and swelling.  Follow up appointment in one week.   To ease pain and swelling, apply ice to your knee, twice per day, for 2-3 days after your procedure:  Put ice in a plastic bag.  Place a towel between your skin and the bag.  Leave the ice on for 15 minutes.  Change dressings as directed.  Take over-the-counter or prescription medicines for pain, discomfort, or fever only as directed by your health care provider.  Use crutches or braces or splints as directed by your health care provider.  Do not exercise your leg unless instructed to do so by your health care provider. SEEK IMMEDIATE MEDICAL CARE IF:   You develop increased redness, swelling, or pain around your incision sites.  You have increased pain with movement of your knee.  You have a marked increase in swelling around your knee.  You have a lot of pain in your leg when you move your foot up and down at your ankle.  There is pus or any unusual drainage coming from your incision sites.  You develop a fever.  You notice a bad smell coming from your incision sites.  Any of your incisions break open (edges do not stay together) after sutures or staples have been removed. Document Released: 08/14/2004 Document  Revised: 06/11/2013 Document Reviewed: 06/20/2011 [facility] Patient Information 2015 [place], [place]. This information is not intended to replace advice given to you by your health care provider. Make sure you discuss any questions you have with your health care provider.    Post Anesthesia Home Care Instructions  Activity: Get plenty of rest for the remainder of the day. A responsible adult should stay with you for 24 hours following the procedure.  For the next 24 hours, DO NOT: -Drive a car -Advertising copywriterperate machinery -Drink alcoholic beverages -Take any medication unless instructed by your physician -Make any legal decisions or sign important papers.  Meals: Start with liquid foods such as gelatin or soup. Progress to regular foods as tolerated. Avoid greasy, spicy, heavy foods. If nausea and/or vomiting occur, drink only clear liquids until the nausea and/or vomiting subsides. Call your physician if vomiting continues.  Special Instructions/Symptoms: Your throat may feel dry or sore from the anesthesia or the breathing tube placed in your throat during surgery. If this causes discomfort, gargle with warm salt water. The discomfort should disappear within 24 hours.   Regional Anesthesia Blocks  1. Numbness or the inability to move the "blocked" extremity may last from 3-48 hours after placement. The length of time depends on the medication injected and your individual response to the medication. If the numbness is not going away after 48 hours, call your surgeon.  2. The extremity that is blocked will need to be protected until the numbness is gone and the  Strength has returned. Because you cannot feel it, you will need to take extra care to avoid injury. Because it may be weak, you may have difficulty moving it or using it. You may not know what position it is in without looking at it while the block is in effect.  3. For blocks in the legs and feet, returning to weight bearing and walking  needs to be done carefully. You will need to wait until the numbness is entirely gone and the strength has returned. You should be able to move your leg and foot normally before you try and bear weight or walk. You will need someone to be with you when you first try to ensure you do not fall and possibly risk injury.  4. Bruising and tenderness at the needle site are common side effects and will resolve in a few days.  5. Persistent numbness or new problems with movement should be communicated to the surgeon or the [facility] [phone])/ [facility] [phone]).

## 2014-01-28 NOTE — Transfer of Care (Signed)
Immediate Anesthesia Transfer of Care Note  Patient: Casey Mcmahon  Procedure(s) Performed: Procedure(s): LEFT KNEE ARTHROSCOPY WITH MEDIAL MENISCECTOMY AND ANTERIOR CRUCIATE LIGAMENT REPAIR (Left)  Patient Location: PACU  Anesthesia Type:General and GA combined with regional for post-op pain  Level of Consciousness: awake and alert   Airway & Oxygen Therapy: Patient Spontanous Breathing and Patient connected to face mask oxygen  Post-op Assessment: Report given to PACU RN and Post -op Vital signs reviewed and stable  Post vital signs: Reviewed and stable  Complications: No apparent anesthesia complications

## 2014-01-28 NOTE — Anesthesia Postprocedure Evaluation (Signed)
  Anesthesia Post-op Note  Patient: Casey Mcmahon  Procedure(s) Performed: Procedure(s): LEFT KNEE ARTHROSCOPY WITH MEDIAL MENISCECTOMY AND ANTERIOR CRUCIATE LIGAMENT REPAIR (Left)  Patient Location: PACU  Anesthesia Type: General with regional   Level of Consciousness: awake, alert  and oriented  Airway and Oxygen Therapy: Patient Spontanous Breathing  Post-op Pain: mild  Post-op Assessment: Post-op Vital signs reviewed  Post-op Vital Signs: Reviewed  Last Vitals:  Filed Vitals:   01/28/14 1630  BP: 148/84  Pulse: 93  Temp:   Resp: 14    Complications: No apparent anesthesia complications

## 2014-01-28 NOTE — Anesthesia Preprocedure Evaluation (Signed)
Anesthesia Evaluation  Patient identified by MRN, date of birth, ID band Patient awake    Reviewed: Allergy & Precautions, H&P , NPO status , Patient's Chart, lab work & pertinent test results  Airway Mallampati: I  TM Distance: >3 FB Neck ROM: Full    Dental  (+) Teeth Intact, Dental Advisory Given   Pulmonary  breath sounds clear to auscultation        Cardiovascular hypertension, Pt. on medications Rhythm:Regular Rate:Normal     Neuro/Psych    GI/Hepatic GERD-  Medicated,  Endo/Other    Renal/GU      Musculoskeletal   Abdominal   Peds  Hematology   Anesthesia Other Findings   Reproductive/Obstetrics                             Anesthesia Physical Anesthesia Plan  ASA: II  Anesthesia Plan: General   Post-op Pain Management:    Induction: Intravenous  Airway Management Planned: LMA  Additional Equipment:   Intra-op Plan:   Post-operative Plan: Extubation in OR  Informed Consent: I have reviewed the patients History and Physical, chart, labs and discussed the procedure including the risks, benefits and alternatives for the proposed anesthesia with the patient or authorized representative who has indicated his/her understanding and acceptance.   Dental advisory given  Plan Discussed with: CRNA, Anesthesiologist and Surgeon  Anesthesia Plan Comments:         Anesthesia Quick Evaluation

## 2014-01-28 NOTE — Anesthesia Procedure Notes (Addendum)
Anesthesia Regional Block:  Adductor canal block  Pre-Anesthetic Checklist: ,, timeout performed, Correct Patient, Correct Site, Correct Laterality, Correct Procedure, Correct Position, site marked, Risks and benefits discussed,  Surgical consent,  Pre-op evaluation,  At surgeon's request and post-op pain management  Laterality: Left and Lower  Prep: chloraprep       Needles:  Injection technique: Single-shot  Needle Type: Echogenic Needle     Needle Length: 9cm 9 cm Needle Gauge: 21 and 21 G    Additional Needles:  Procedures: ultrasound guided (picture in chart) Adductor canal block Narrative:  Start time: 01/28/2014 12:35 PM End time: 01/28/2014 12:40 PM Injection made incrementally with aspirations every 5 mL.  Performed by: Personally  Anesthesiologist: CREWS, DAVID A   Procedure Name: LMA Insertion Date/Time: 01/28/2014 2:23 PM Performed by: Caren MacadamARTER, [place] W Pre-anesthesia Checklist: Patient identified, Emergency Drugs available, Suction available and Patient being monitored Patient Re-evaluated:Patient Re-evaluated prior to inductionOxygen Delivery Method: Circle System Utilized Preoxygenation: Pre-oxygenation with 100% oxygen Intubation Type: IV induction Ventilation: Mask ventilation without difficulty LMA: LMA inserted LMA Size: 4.0 Number of attempts: 1 Airway Equipment and Method: bite block Placement Confirmation: positive ETCO2 and breath sounds checked- equal and bilateral Tube secured with: Tape Dental Injury: Teeth and Oropharynx as per pre-operative assessment

## 2014-01-28 NOTE — Interval H&P Note (Signed)
History and Physical Interval Note:  01/28/2014 2:10 PM  Casey Mcmahon  has presented today for surgery, with the diagnosis of Other meniscus derangements , posterior horn of medial meniscus left knee, sprain of unspecified cruciate ligament of unspecified knee, inital encounter left knee  The various methods of treatment have been discussed with the patient and family. After consideration of risks, benefits and other options for treatment, the patient has consented to  Procedure(s): LEFT KNEE ARTHROSCOPY WITH MEDIAL MENISCECTOMY AND ANTERIOR CRUCIATE LIGAMENT REPAIR (Left) as a surgical intervention .  The patient's history has been reviewed, patient examined, no change in status, stable for surgery.  I have reviewed the patient's chart and labs.  Questions were answered to the patient's satisfaction.     Satin Boal F

## 2014-01-29 NOTE — Op Note (Signed)
NAMRadene Journey:  CURTIS, See               ACCOUNT NO.:  [id number]  MEDICAL RECORD NO.:  [id number]  LOCATION:                                 FACILITY:  PHYSICIAN:  Loreta Aveaniel F. Rannie Craney, M.D. 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  Irreparable bucket- handle tear medial meniscus.  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  Irreparable bucket- handle tear medial meniscus with associated focal grade 2 and 3 chondromalacia, medial patella.  PROCEDURE:  Left knee exam under anesthesia, arthroscopy.  Arthroscopic and endoscopic anterior cruciate ligament reconstruction, anterior tib allograft.  Notchplasty.  Tightrope fixation proximally interference screw PushLock fixation distally.  Partial medial meniscectomy. Chondroplasty patellofemoral joint.  SURGEON:  Loreta Aveaniel F. Luan Maberry, MD  ASSISTANT:  Odelia GageLindsey Anton PA, present throughout the entire case, necessary for timely completion of procedure.  ANESTHESIA:  General.  BLOOD LOSS:  Minimal.  SPECIMENS:  None.  CULTURES:  None.  COMPLICATIONS:  None.  DRESSINGS:  Soft compressive knee immobilizer.  TOURNIQUET TIME:  One hour 15 minutes.  PROCEDURE IN DETAIL:  The patient was brought to operating room, placed on the operating table in supine position.  After adequate anesthesia had been obtained, knee examined.  Full motion.  Positive Lachman, drawer, pivot shift, marked instability.  Other ligaments stable. Tourniquet applied, prepped and draped in usual sterile fashion. Exsanguinated with elevation of Esmarch.  Tourniquet inflated to 350 mmHg.  Two portals, one each medial and lateral parapatellar. Arthroscope introduced, knee distended  and inspected.  Some grade 2 and 3 focal changes on the patella debrided.  Good tracking.  Lateral meniscus, lateral compartment looked good.  Medial compartment looked good, but there was an irreparable bucket-handle tear posterior 2/3rd meniscus, white-white completely irreparable.  This was divided anteriorly, pulled out and completely resected tapered in smoothly.  The ACL completely deficient.  Notch cleared, notchplasty performed. Allograft prepared for 10 mm tunnels.  Incision next to tibial tubercle. Guidewire 10 mm reamer exiting out through the footprint of the ACL. Femoral guide was inserted across tibial tunnel notch on the back cortex of femur.  Guidewire and then reamer for appropriate depth.  Debris cleared throughout.  Tunnel was assessed, found to be in good position. The passing wire from the tightrope was then passed across both tunnels and out through a stab wound anterolateral thigh.  The graft attached was pulled across to the knee and then the tightrope backed up against the femoral cortex confirming good position fluoroscopically.  The graft was advanced well into both tunnels,  fixed distally over Nitinol wire with a 9 x 28 BioScrew.  Exiting sutures anchored into the tibia with a PushLock predrill anchor.  At completion, grade stability, full motion. Good clearance of graft when viewed through full motion.  Wound was irrigated,  closed with subcutaneous and subcuticular Vicryl.  Portals were closed with nylon.  Sterile compressive dressing applied. Tourniquet deflated, removed.  Knee immobilizer applied.  Anesthesia reversed, brought to  the recovery room, tolerated the surgery well.  No complications.     Loreta Aveaniel F. Alyha Marines, M.D.     DFM/MEDQ  D:  01/28/2014  T:  01/29/2014  Job:  [id number]

## 2014-02-05 ENCOUNTER — Encounter (HOSPITAL_BASED_OUTPATIENT_CLINIC_OR_DEPARTMENT_OTHER): Payer: Self-pay | Admitting: Orthopedic Surgery

## 2014-05-03 ENCOUNTER — Encounter (HOSPITAL_COMMUNITY): Payer: Self-pay | Admitting: Emergency Medicine

## 2014-05-03 ENCOUNTER — Emergency Department (HOSPITAL_COMMUNITY): Payer: 59

## 2014-05-03 ENCOUNTER — Emergency Department (HOSPITAL_COMMUNITY)
Admission: EM | Admit: 2014-05-03 | Discharge: 2014-05-04 | Disposition: A | Discharge status: Home / Self Care | Payer: 59 | Attending: Emergency Medicine | Admitting: Emergency Medicine

## 2014-05-03 DIAGNOSIS — I10 Essential (primary) hypertension: Secondary | ICD-10-CM | POA: Diagnosis not present

## 2014-05-03 DIAGNOSIS — Z8719 Personal history of other diseases of the digestive system: Secondary | ICD-10-CM | POA: Diagnosis not present

## 2014-05-03 DIAGNOSIS — G43909 Migraine, unspecified, not intractable, without status migrainosus: Secondary | ICD-10-CM | POA: Insufficient documentation

## 2014-05-03 DIAGNOSIS — Z79899 Other long term (current) drug therapy: Secondary | ICD-10-CM | POA: Insufficient documentation

## 2014-05-03 DIAGNOSIS — Z8739 Personal history of other diseases of the musculoskeletal system and connective tissue: Secondary | ICD-10-CM | POA: Diagnosis not present

## 2014-05-03 DIAGNOSIS — R000 Tachycardia, unspecified: Secondary | ICD-10-CM | POA: Diagnosis not present

## 2014-05-03 DIAGNOSIS — J45901 Unspecified asthma with (acute) exacerbation: Secondary | ICD-10-CM

## 2014-05-03 DIAGNOSIS — J45909 Unspecified asthma, uncomplicated: Secondary | ICD-10-CM | POA: Diagnosis present

## 2014-05-03 DIAGNOSIS — R059 Cough, unspecified: Secondary | ICD-10-CM

## 2014-05-03 DIAGNOSIS — R05 Cough: Secondary | ICD-10-CM

## 2014-05-03 LAB — CBC
HEMATOCRIT: 41.3 % (ref 36.0–46.0)
Hemoglobin: 14 g/dL (ref 12.0–15.0)
MCH: 31.1 pg (ref 26.0–34.0)
MCHC: 33.9 g/dL (ref 30.0–36.0)
MCV: 91.8 fL (ref 78.0–100.0)
PLATELETS: 274 10*3/uL (ref 150–400)
RBC: 4.5 MIL/uL (ref 3.87–5.11)
RDW: 12.1 % (ref 11.5–15.5)
WBC: 10.1 10*3/uL (ref 4.0–10.5)

## 2014-05-03 LAB — BASIC METABOLIC PANEL
ANION GAP: 9 (ref 5–15)
BUN: 10 mg/dL (ref 6–23)
CALCIUM: 9.1 mg/dL (ref 8.4–10.5)
CO2: 25 mmol/L (ref 19–32)
CREATININE: 0.9 mg/dL (ref 0.50–1.10)
Chloride: 105 mmol/L (ref 96–112)
GFR calc Af Amer: >90 mL/min (ref >90)
GFR calc non Af Amer: 85 mL/min — ABNORMAL LOW (ref >90)
Glucose, Bld: 116 mg/dL — ABNORMAL HIGH (ref 70–99)
Potassium: 3.5 mmol/L (ref 3.5–5.1)
SODIUM: 139 mmol/L (ref 135–145)

## 2014-05-03 MED ORDER — ACETAMINOPHEN-CODEINE #3 300-30 MG PO TABS
2.0000 | ORAL_TABLET | Freq: Once | ORAL | Status: AC
  Administered 2014-05-03: 2 via ORAL
  Filled 2014-05-03: qty 2

## 2014-05-03 MED ORDER — PREDNISONE 20 MG PO TABS
60.0000 mg | ORAL_TABLET | Freq: Once | ORAL | Status: AC
  Administered 2014-05-03: 60 mg via ORAL
  Filled 2014-05-03: qty 3

## 2014-05-03 MED ORDER — SODIUM CHLORIDE 0.9 % IV BOLUS (SEPSIS)
1000.0000 mL | Freq: Once | INTRAVENOUS | Status: AC
  Administered 2014-05-03: 1000 mL via INTRAVENOUS

## 2014-05-03 MED ORDER — MAGNESIUM SULFATE 2 GM/50ML IV SOLN
2.0000 g | Freq: Once | INTRAVENOUS | Status: AC
Start: 2014-05-03 — End: 2014-05-04
  Administered 2014-05-03: 2 g via INTRAVENOUS
  Filled 2014-05-03: qty 50

## 2014-05-03 MED ORDER — ALBUTEROL SULFATE (2.5 MG/3ML) 0.083% IN NEBU
2.5000 mg | INHALATION_SOLUTION | Freq: Once | RESPIRATORY_TRACT | Status: AC
  Administered 2014-05-03: 2.5 mg via RESPIRATORY_TRACT

## 2014-05-03 MED ORDER — IPRATROPIUM-ALBUTEROL 0.5-2.5 (3) MG/3ML IN SOLN
3.0000 mL | Freq: Once | RESPIRATORY_TRACT | Status: AC
  Administered 2014-05-03: 3 mL via RESPIRATORY_TRACT
  Filled 2014-05-03: qty 3

## 2014-05-03 MED ORDER — ALBUTEROL SULFATE (2.5 MG/3ML) 0.083% IN NEBU
2.5000 mg | INHALATION_SOLUTION | Freq: Once | RESPIRATORY_TRACT | Status: AC
  Administered 2014-05-03: 2.5 mg via RESPIRATORY_TRACT
  Filled 2014-05-03: qty 3

## 2014-05-03 MED ORDER — BENZONATATE 100 MG PO CAPS
200.0000 mg | ORAL_CAPSULE | Freq: Once | ORAL | Status: AC
  Administered 2014-05-03: 200 mg via ORAL
  Filled 2014-05-03: qty 2

## 2014-05-03 NOTE — ED Notes (Signed)
Respiratory left the room, stated she would be back down to reassess patient shortly

## 2014-05-03 NOTE — ED Provider Notes (Signed)
CSN: [id number]     Arrival date & time 05/03/14  2218 History   First MD Initiated Contact with Patient 05/03/14 2259     Chief Complaint  Patient presents with  . Asthma     (Consider location/radiation/quality/duration/timing/severity/associated sxs/prior Treatment) HPI  Casey Mcmahon is a 29 y.o. female with past medical history of allergies, asthma presenting today with asthma exacerbation. Patient states she's had shortness of breath and cough for the last 2 weeks. This has gotten worse over the last 2 days. She believes it is also due to her allergies, her primary care physician placed her on Claritin without any relief. Her cough is nonproductive, she denies any fevers or chills. She states her normal flare comes from having a cold or change in the weather. Patient states she never wheezes with her asthma exacerbations. She denies any history of blood clots. She did have recent travel and surgery in December. She denies any atypical swelling or pain in her legs. Patient has no further complaints.  10 Systems reviewed and are negative for acute change except as noted in the HPI.     Past Medical History  Diagnosis Date  . Asthma     prn inhaler  . Seasonal allergies     current nasal congestion (01/24/2014)  . Migraines     occasional  . GERD (gastroesophageal reflux disease)     no current med.  . Hypertension     under control with med., has been on med. x 3 yr.  . Derangement of posterior horn of medial meniscus 01/2014    left  . Left ACL tear 01/2014   Past Surgical History  Procedure Laterality Date  . Orif ankle fracture Left 07/19/2013    Procedure: LEFT ANKLE FRACTURE OPEN TREATMENT BIMALLEOLAR ANKLE INCLUDES INTERNAL FIXATION, LEFT ANKLE FRACTURE OPEN TREATMENT DISTAL TIBIOFIBULAR fasciotomy INCLUDES INTERNAL FIXATION repai of synosmosis;  Surgeon: Loreta Aveaniel F Murphy, MD;  Location: [facility];  Service: Orthopedics;  Laterality: Left;  . Knee  arthroscopy with anterior cruciate ligament (acl) repair Left 01/28/2014    Procedure: LEFT KNEE ARTHROSCOPY WITH MEDIAL MENISCECTOMY AND ANTERIOR CRUCIATE LIGAMENT REPAIR;  Surgeon: Loreta Aveaniel F Murphy, MD;  Location: [facility];  Service: Orthopedics;  Laterality: Left;   Family History  Problem Relation Age of Onset  . Diabetes Maternal Grandfather   . Diabetes Mother    History  Substance Use Topics  . Smoking status: Never Smoker   . Smokeless tobacco: Never Used  . Alcohol Use: Yes     Comment: socially   OB History    No data available     Review of Systems    Allergies  Review of patient's allergies indicates no known allergies.  Home Medications   Prior to Admission medications   Medication Sig Start Date End Date Taking? Authorizing Provider  albuterol (PROVENTIL HFA;VENTOLIN HFA) 108 (90 BASE) MCG/ACT inhaler Inhale 1 puff into the lungs every 6 (six) hours as needed for wheezing or shortness of breath.   Yes Historical Provider, MD  DiphenhydrAMINE HCl 50 MG/30ML LIQD Take 30 mLs by mouth 2 (two) times daily as needed. Cough and cold symptoms   Yes Historical Provider, MD  levonorgestrel (MIRENA) 20 MCG/24HR IUD 1 each by Intrauterine route continuous.   Yes Historical Provider, MD  lisinopril (PRINIVIL,ZESTRIL) 10 MG tablet Take 10 mg by mouth daily.   Yes Historical Provider, MD  loratadine (CLARITIN) 10 MG tablet Take 10 mg by mouth daily.  Yes Historical Provider, MD  ondansetron (ZOFRAN) 4 MG tablet Take 1 tablet (4 mg total) by mouth every 8 (eight) hours as needed for nausea or vomiting. Patient not taking: Reported on 05/03/2014 01/28/14   Cristie Hem, PA-C  oxyCODONE-acetaminophen (ROXICET) 5-325 MG per tablet Take 1-2 tablets by mouth every 4 (four) hours as needed. Patient not taking: Reported on 05/03/2014 01/28/14   Cristie Hem, PA-C   BP 158/104 mmHg  Pulse 128  Temp(Src) 98.8 F (37.1 C) (Oral)  Resp 20  Ht  (1.727 m)  Wt  215 lb (97.523 kg)  BMI 32.70 kg/m2  SpO2 96% Physical Exam  Constitutional: She is oriented to person, place, and time. She appears well-developed and well-nourished. No distress.  HENT:  Head: Normocephalic and atraumatic.  Nose: Nose normal.  Mouth/Throat: Oropharynx is clear and moist. No oropharyngeal exudate.  Eyes: Conjunctivae and EOM are normal. Pupils are equal, round, and reactive to light. No scleral icterus.  Neck: Normal range of motion. Neck supple. No JVD present. No tracheal deviation present. No thyromegaly present.  Cardiovascular: Regular rhythm and normal heart sounds.  Exam reveals no gallop and no friction rub.   No murmur heard. Tachycardic  Pulmonary/Chest: Effort normal and breath sounds normal. No respiratory distress. She has no wheezes. She exhibits no tenderness.  Abdominal: Soft. Bowel sounds are normal. She exhibits no distension and no mass. There is no tenderness. There is no rebound and no guarding.  Musculoskeletal: Normal range of motion. She exhibits no edema or tenderness.  Lymphadenopathy:    She has no cervical adenopathy.  Neurological: She is alert and oriented to person, place, and time. No cranial nerve deficit. She exhibits normal muscle tone.  Skin: Skin is warm and dry. No rash noted. No erythema. No pallor.  Nursing note and vitals reviewed.   ED Course  Procedures (including critical care time) Labs Review Labs Reviewed  BASIC METABOLIC PANEL - Abnormal; Notable for the following:    Glucose, Bld 116 (*)    GFR calc non Af Amer 85 (*)    All other components within normal limits  CBC  D-DIMER, QUANTITATIVE    Imaging Review Dg Chest 2 View  05/03/2014   CLINICAL DATA:  Asthma flare up.  Worsening cough.  EXAM: CHEST  2 VIEW  COMPARISON:  05/13/2009  FINDINGS: The heart size and mediastinal contours are within normal limits. Both lungs are clear. The visualized skeletal structures are unremarkable.  IMPRESSION: No active  cardiopulmonary disease.   Electronically Signed   By: Charlett Nose M.D.   On: 05/03/2014 23:33     EKG Interpretation   Date/Time:  Friday May 03 2014 23:19:37 EDT Ventricular Rate:  127 PR Interval:  129 QRS Duration: 95 QT Interval:  354 QTC Calculation: 515 R Axis:   41 Text Interpretation:  Sinus tachycardia RSR' in V1 or V2, probably normal  variant No significant change since last tracing Confirmed by Erroll Luna [phone]) on 05/03/2014 11:24:18 PM      MDM   Final diagnoses:  Cough    Patient since emergency department for frequent cough and shortness of breath. She states is consistent with her asthma exacerbation and she states that she never wheezes when this occurs. Patient does have low risk for pulmonary was as well. Will obtain d-dimer. Patient was given prednisone, DuoNeb, IV fluids, magnesium in the emergency department for treatment.  For her pain, she is given Lawyer and Tylenol  No. 3.  Upon repeat evaluation, patient appears much more comfortable. Cough has resolved. D-dimer is negative, this is consistent with her asthma exacerbation. Patient's tachycardia has decreased to the 1 teens. We'll give 1 more liter of fluid. This is likely due to her albuterol treatments.  Patient we discharged with five-day course of prednisone. Her vital signs remain within her normal limits and she is safe for DC with PCP fu within 3 days.   Tomasita Crumble, MD 05/04/14 [phone]

## 2014-05-03 NOTE — ED Notes (Signed)
Patient c/o asthma flare up. Patient states @ 10 days ago she started with a sore throat, and the past couple of days she has a worsening cough. Patient has been using Proventil inhaler at home without relief.

## 2014-05-04 LAB — D-DIMER, QUANTITATIVE (NOT AT ARMC): D DIMER QUANT: 0.42 ug{FEU}/mL (ref 0.00–0.48)

## 2014-05-04 MED ORDER — SODIUM CHLORIDE 0.9 % IV BOLUS (SEPSIS)
1000.0000 mL | Freq: Once | INTRAVENOUS | Status: AC
  Administered 2014-05-04: 1000 mL via INTRAVENOUS

## 2014-05-04 MED ORDER — PREDNISONE 20 MG PO TABS: ORAL_TABLET | ORAL | Status: DC

## 2014-05-04 NOTE — Discharge Instructions (Signed)
Asthma Casey Mcmahon, take prednisone as prescribed and see your primary physician within 3 days for follow up.  If symptoms worsen, come back to the ED immediately.  Thank you. Asthma is a condition of the lungs in which the airways tighten and narrow. Asthma can make it hard to breathe. Asthma cannot be cured, but medicine and lifestyle changes can help control it. Asthma may be started (triggered) by:  Animal skin flakes (dander).  Dust.  Cockroaches.  Pollen.  Mold.  Smoke.  Cleaning products.  Hair sprays or aerosol sprays.  Paint fumes or strong smells.  Cold air, weather changes, and winds.  Crying or laughing hard.  Stress.  Certain medicines or drugs.  Foods, such as dried fruit, potato chips, and sparkling grape juice.  Infections or conditions (colds, flu).  Exercise.  Certain medical conditions or diseases.  Exercise or tiring activities. HOME CARE   Take medicine as told by your doctor.  Use a peak flow meter as told by your doctor. A peak flow meter is a tool that measures how well the lungs are working.  Record and keep track of the peak flow meter's readings.  Understand and use the asthma action plan. An asthma action plan is a written plan for taking care of your asthma and treating your attacks.  To help prevent asthma attacks:  Do not smoke. Stay away from secondhand smoke.  Change your heating and air conditioning filter often.  Limit your use of fireplaces and wood stoves.  Get rid of pests (such as roaches and mice) and their droppings.  Throw away plants if you see mold on them.  Clean your floors. Dust regularly. Use cleaning products that do not smell.  Have someone vacuum when you are not home. Use a vacuum cleaner with a HEPA filter if possible.  Replace carpet with wood, tile, or vinyl flooring. Carpet can trap animal skin flakes and dust.  Use allergy-proof pillows, mattress covers, and box spring covers.  Wash bed sheets  and blankets every week in hot water and dry them in a dryer.  Use blankets that are made of polyester or cotton.  Clean bathrooms and kitchens with bleach. If possible, have someone repaint the walls in these rooms with mold-resistant paint. Keep out of the rooms that are being cleaned and painted.  Wash hands often. GET HELP IF:  You have make a whistling sound when breaking (wheeze), have shortness of breath, or have a cough even if taking medicine to prevent attacks.  The colored mucus you cough up (sputum) is thicker than usual.  The colored mucus you cough up changes from clear or white to yellow, green, gray, or bloody.  You have problems from the medicine you are taking such as:  A rash.  Itching.  Swelling.  Trouble breathing.  You need reliever medicines more than 2-3 times a week.  Your peak flow measurement is still at 50-79% of your personal best after following the action plan for 1 hour.  You have a fever. GET HELP RIGHT AWAY IF:   You seem to be worse and are not responding to medicine during an asthma attack.  You are short of breath even at rest.  You get short of breath when doing very little activity.  You have trouble eating, drinking, or talking.  You have chest pain.  You have a fast heartbeat.  Your lips or fingernails start to turn blue.  You are light-headed, dizzy, or faint.  Your peak  flow is less than 50% of your personal best. MAKE SURE YOU:   Understand these instructions.  Will watch your condition.  Will get help right away if you are not doing well or get worse. Document Released: 07/14/2007 Document Revised: 06/11/2013 Document Reviewed: 08/24/2012 [facility] Patient Information 2015 [place], [place]. This information is not intended to replace advice given to you by your health care provider. Make sure you discuss any questions you have with your health care provider.

## 2014-05-04 NOTE — Progress Notes (Signed)
RT assesed Pt per Adult wheeze protocol.  Pt in no distress at this time, BBS are CTA and vitals are WNL.  RT will not administer anymore TX at this time.  RT to monitor and assess as needed.

## 2016-02-18 ENCOUNTER — Ambulatory Visit (INDEPENDENT_AMBULATORY_CARE_PROVIDER_SITE_OTHER): Payer: 59 | Admitting: Obstetrics & Gynecology

## 2016-02-18 ENCOUNTER — Encounter: Payer: Self-pay | Admitting: Obstetrics & Gynecology

## 2016-02-18 VITALS — BP 166/115 | HR 97 | Temp 98.4°F | Ht 68.0 in | Wt 244.0 lb

## 2016-02-18 DIAGNOSIS — Z30431 Encounter for routine checking of intrauterine contraceptive device: Secondary | ICD-10-CM

## 2016-02-18 DIAGNOSIS — Z01419 Encounter for gynecological examination (general) (routine) without abnormal findings: Secondary | ICD-10-CM | POA: Diagnosis not present

## 2016-02-18 DIAGNOSIS — I10 Essential (primary) hypertension: Secondary | ICD-10-CM

## 2016-02-18 DIAGNOSIS — Z0000 Encounter for general adult medical examination without abnormal findings: Secondary | ICD-10-CM

## 2016-02-18 MED ORDER — LISINOPRIL 10 MG PO TABS: 10.0000 mg | ORAL_TABLET | Freq: Every day | ORAL | 2 refills | Status: DC

## 2016-02-18 NOTE — Progress Notes (Signed)
Patient is in the office for IUD removal. Patient states that she has history of migraines and increasd BP.

## 2016-02-18 NOTE — Progress Notes (Signed)
Subjective:     Casey Mcmahon is a 31 y.o. female here for a routine exam.  Current complaints: pt is here for LnIUD removal. She reports that it was due to be removed 2 months prev.  She denies problems with her IUD.  She reports a h/o Chronic HTN but, has been off of her meds for awhile. She does not have a primary care provider. She reports that she did not take her meds regularly when she was on them. She has note decided what she wants to do for contraception and is undecided about whether she wants to have another pregnancy. She and her husband do no have children together.    She reports and increase in migraine HA without aura.  She thinks that this could be due to the IUD. She reports that this is her 2nd IUD. Her youngest child is 19 yo. She reports heavy bleeding when the IUD was removed. PRIOR to her second IUD.   Gynecologic History No LMP recorded. Patient is not currently having periods (Reason: IUD). Contraception: IUD Last Pap: 2 years prev and WNL. No h/o abnormal PAP Last mammogram: n/a.  Obstetric History G2P2 IUD expired 12/2015 placed in Nov   The following portions of the patient's history were reviewed and updated as appropriate: allergies, current medications, past family history, past medical history, past social history, past surgical history and problem list.  Review of Systems Pertinent items are noted in HPI.    Objective:  BP (!) 166/115   Pulse 97   Temp 98.4 F (36.9 C)   Ht 5\' 8"  (1.727 m)   Wt 244 lb (110.7 kg)   BMI 37.10 kg/m  General Appearance:    Alert, cooperative, no distress, appears stated age  Head:    Normocephalic, without obvious abnormality, atraumatic  Eyes:    conjunctiva/corneas clear, EOM's intact, both eyes  Ears:    Normal external ear canals, both ears  Nose:   Nares normal, septum midline, mucosa normal, no drainage    or sinus tenderness  Throat:   Lips, mucosa, and tongue normal; teeth and gums normal  Neck:   Supple,  symmetrical, trachea midline, no adenopathy;    thyroid:  no enlargement/tenderness/nodules  Back:     Symmetric, no curvature, ROM normal, no CVA tenderness  Lungs:     Clear to auscultation bilaterally, respirations unlabored  Chest Wall:    No tenderness or deformity   Heart:    Regular rate and rhythm, S1 and S2 normal, no murmur, rub   or gallop  Breast Exam:    No tenderness, masses, or nipple abnormality  Abdomen:     Soft, non-tender, bowel sounds active all four quadrants,    no masses, no organomegaly  Genitalia:    Normal female without lesion, discharge or tenderness: IUD strings noted     Extremities:   Extremities normal, atraumatic, no cyanosis or edema  Pulses:   2+ and symmetric all extremities  Skin:   Skin color, texture, turgor normal, no rashes or lesions     Assessment:    Healthy female exam.   Chronic HTN- uncontrolled. I spent an extended amount of time speaking to pt about the longterm risks of elevated BP and the need for Tx  IUD- given the elevation it the pts BP and fact that she has not decided on a method of contraception, I discussed with pt my concern over removing her IUD. I am very concerned about her uncontrolled  HTN and it would not be wise for her to conceive until it is better controlled. Pt is not interested in getting another IUD right now but, does agree to leaving the IUD in longer until her BP is better controlled. She is aware that the LnIUD is NOT FDA approved beyond 5 year s in the [place]. And understands that it is possible to get pregnant after 5 years although this number is low.        Plan:   NO PAP done today F/u in 6 months of rIUD removal if BP imporved Referral to primary care provider to manage BP Lisinorpil 10mg  daily Labs: CMP, CBC, TSH  Total face-to-face time with patient was 45 min.  Greater than 50% was spent in counseling and coordination of care with the patient.   Madyn Ivins L. Harraway-Smith, M.D., Evern CoreFACOG

## 2016-02-18 NOTE — Patient Instructions (Addendum)
GO WHITE: Soap: UNSCENTED Dove (white box light green writing) Laundry detergent (underwear)- Dreft or [employer] unscented WHITE 100% cotton panties (NOT just cotton crouch) Sanitary napkin/panty liners: UNSCENTED.  If it doesn't SAY unscented it can have a scent/perfume    NO PERFUMES OR LOTIONS OR POTIONS in the vulvar area (may use regular KY) Condoms: hypoallergenic only. Non dyed (no color) Toilet papers: white only Wash clothes: use a separate wash cloth. WHITE.  Washed in Dreft.     Hypertension Hypertension, commonly called high blood pressure, is when the force of blood pumping through your arteries is too strong. Your arteries are the blood vessels that carry blood from your heart throughout your body. A blood pressure reading consists of a higher number over a lower number, such as 110/72. The higher number (systolic) is the pressure inside your arteries when your heart pumps. The lower number (diastolic) is the pressure inside your arteries when your heart relaxes. Ideally you want your blood pressure below 120/80. Hypertension forces your heart to work harder to pump blood. Your arteries may become narrow or stiff. Having untreated or uncontrolled hypertension can cause heart attack, stroke, kidney disease, and other problems. What increases the risk? Some risk factors for high blood pressure are controllable. Others are not. Risk factors you cannot control include:  Race. You may be at higher risk if you are African American.  Age. Risk increases with age.  Gender. Men are at higher risk than women before age 26 years. After age 62, women are at higher risk than men. Risk factors you can control include:  Not getting enough exercise or physical activity.  Being overweight.  Getting too much fat, sugar, calories, or salt in your diet.  Drinking too much alcohol. What are the signs or symptoms? Hypertension does not usually cause signs or symptoms. Extremely high  blood pressure (hypertensive crisis) may cause headache, anxiety, shortness of breath, and nosebleed. How is this diagnosed? To check if you have hypertension, your health care provider will measure your blood pressure while you are seated, with your arm held at the level of your heart. It should be measured at least twice using the same arm. Certain conditions can cause a difference in blood pressure between your right and left arms. A blood pressure reading that is higher than normal on one occasion does not mean that you need treatment. If it is not clear whether you have high blood pressure, you may be asked to return on a different day to have your blood pressure checked again. Or, you may be asked to monitor your blood pressure at home for 1 or more weeks. How is this treated? Treating high blood pressure includes making lifestyle changes and possibly taking medicine. Living a healthy lifestyle can help lower high blood pressure. You may need to change some of your habits. Lifestyle changes may include:  Following the DASH diet. This diet is high in fruits, vegetables, and whole grains. It is low in salt, red meat, and added sugars.  Keep your sodium intake below 2,300 mg per day.  Getting at least 30-45 minutes of aerobic exercise at least 4 times per week.  Losing weight if necessary.  Not smoking.  Limiting alcoholic beverages.  Learning ways to reduce stress. Your health care provider may prescribe medicine if lifestyle changes are not enough to get your blood pressure under control, and if one of the following is true:  You are 74-56 years of age and your  systolic blood pressure is above 140.  You are 16 years of age or older, and your systolic blood pressure is above 150.  Your diastolic blood pressure is above 90.  You have diabetes, and your systolic blood pressure is over 140 or your diastolic blood pressure is over 90.  You have kidney disease and your blood pressure is  above 140/90.  You have heart disease and your blood pressure is above 140/90. Your personal target blood pressure may vary depending on your medical conditions, your age, and other factors. Follow these instructions at home:  Have your blood pressure rechecked as directed by your health care provider.  Take medicines only as directed by your health care provider. Follow the directions carefully. Blood pressure medicines must be taken as prescribed. The medicine does not work as well when you skip doses. Skipping doses also puts you at risk for problems.  Do not smoke.  Monitor your blood pressure at home as directed by your health care provider. Contact a health care provider if:  You think you are having a reaction to medicines taken.  You have recurrent headaches or feel dizzy.  You have swelling in your ankles.  You have trouble with your vision. Get help right away if:  You develop a severe headache or confusion.  You have unusual weakness, numbness, or feel faint.  You have severe chest or abdominal pain.  You vomit repeatedly.  You have trouble breathing. This information is not intended to replace advice given to you by your health care provider. Make sure you discuss any questions you have with your health care provider. Document Released: 01/25/2005 Document Revised: 07/03/2015 Document Reviewed: 11/17/2012 Elsevier Interactive Patient Education  2017 Elsevier Inc.  Vaginitis Vaginitis is an inflammation of the vagina. It is most often caused by a change in the normal balance of the bacteria and yeast that live in the vagina. This change in balance causes an overgrowth of certain bacteria or yeast, which causes the inflammation. There are different types of vaginitis, but the most common types are:  Bacterial vaginosis.  Yeast infection (candidiasis).  Trichomoniasis vaginitis. This is a sexually transmitted infection (STI).  Viral vaginitis.  Atrophic  vaginitis.  Allergic vaginitis. CAUSES  The cause depends on the type of vaginitis. Vaginitis can be caused by:  Bacteria (bacterial vaginosis).  Yeast (yeast infection).  A parasite (trichomoniasis vaginitis)  A virus (viral vaginitis).  Low hormone levels (atrophic vaginitis). Low hormone levels can occur during pregnancy, breastfeeding, or after menopause.  Irritants, such as bubble baths, scented tampons, and feminine sprays (allergic vaginitis). Other factors can change the normal balance of the yeast and bacteria that live in the vagina. These include:  Antibiotic medicines.  Poor hygiene.  Diaphragms, vaginal sponges, spermicides, birth control pills, and intrauterine devices (IUD).  Sexual intercourse.  Infection.  Uncontrolled diabetes.  A weakened immune system. SYMPTOMS  Symptoms can vary depending on the cause of the vaginitis. Common symptoms include:  Abnormal vaginal discharge.  The discharge is white, gray, or yellow with bacterial vaginosis.  The discharge is thick, white, and cheesy with a yeast infection.  The discharge is frothy and yellow or greenish with trichomoniasis.  A bad vaginal odor.  The odor is fishy with bacterial vaginosis.  Vaginal itching, pain, or swelling.  Painful intercourse.  Pain or burning when urinating. Sometimes, there are no symptoms. TREATMENT  Treatment will vary depending on the type of infection.   Bacterial vaginosis and trichomoniasis are often  treated with antibiotic creams or pills.  Yeast infections are often treated with antifungal medicines, such as vaginal creams or suppositories.  Viral vaginitis has no cure, but symptoms can be treated with medicines that relieve discomfort. Your sexual partner should be treated as well.  Atrophic vaginitis may be treated with an estrogen cream, pill, suppository, or vaginal ring. If vaginal dryness occurs, lubricants and moisturizing creams may help. You may be  told to avoid scented soaps, sprays, or douches.  Allergic vaginitis treatment involves quitting the use of the product that is causing the problem. Vaginal creams can be used to treat the symptoms. HOME CARE INSTRUCTIONS   Take all medicines as directed by your caregiver.  Keep your genital area clean and dry. Avoid soap and only rinse the area with water.  Avoid douching. It can remove the healthy bacteria in the vagina.  Do not use tampons or have sexual intercourse until your vaginitis has been treated. Use sanitary pads while you have vaginitis.  Wipe from front to back. This avoids the spread of bacteria from the rectum to the vagina.  Let air reach your genital area.  Wear cotton underwear to decrease moisture buildup.  Avoid wearing underwear while you sleep until your vaginitis is gone.  Avoid tight pants and underwear or nylons without a cotton panel.  Take off wet clothing (especially bathing suits) as soon as possible.  Use mild, non-scented products. Avoid using irritants, such as:  Scented feminine sprays.  Fabric softeners.  Scented detergents.  Scented tampons.  Scented soaps or bubble baths.  Practice safe sex and use condoms. Condoms may prevent the spread of trichomoniasis and viral vaginitis. SEEK MEDICAL CARE IF:   You have abdominal pain.  You have a fever or persistent symptoms for more than 2-3 days.  You have a fever and your symptoms suddenly get worse. This information is not intended to replace advice given to you by your health care provider. Make sure you discuss any questions you have with your health care provider. Document Released: 11/22/2006 Document Revised: 06/11/2014 Document Reviewed: 07/08/2011 Elsevier Interactive Patient Education  2017 [employer]

## 2016-02-19 LAB — CMP14+CBC/D/PLT+TSH
ALT: 23 IU/L (ref 0–32)
AST: 23 IU/L (ref 0–40)
Albumin/Globulin Ratio: 1.3 (ref 1.2–2.2)
Albumin: 4.2 g/dL (ref 3.5–5.5)
Alkaline Phosphatase: 81 IU/L (ref 39–117)
BUN/Creatinine Ratio: 13 (ref 9–23)
BUN: 10 mg/dL (ref 6–20)
Basophils Absolute: 0 x10E3/uL (ref 0.0–0.2)
Basos: 0 %
Bilirubin Total: 0.2 mg/dL (ref 0.0–1.2)
CO2: 21 mmol/L (ref 18–29)
Calcium: 9.2 mg/dL (ref 8.7–10.2)
Chloride: 101 mmol/L (ref 96–106)
Creatinine, Ser: 0.75 mg/dL (ref 0.57–1.00)
EOS (ABSOLUTE): 0.1 x10E3/uL (ref 0.0–0.4)
Eos: 1 %
GFR calc Af Amer: 122 mL/min/1.73
GFR calc non Af Amer: 106 mL/min/1.73
Globulin, Total: 3.3 g/dL (ref 1.5–4.5)
Glucose: 86 mg/dL (ref 65–99)
Hematocrit: 40.7 % (ref 34.0–46.6)
Hemoglobin: 13.7 g/dL (ref 11.1–15.9)
Immature Grans (Abs): 0 x10E3/uL (ref 0.0–0.1)
Immature Granulocytes: 0 %
Lymphocytes Absolute: 3.1 x10E3/uL (ref 0.7–3.1)
Lymphs: 38 %
MCH: 31.5 pg (ref 26.6–33.0)
MCHC: 33.7 g/dL (ref 31.5–35.7)
MCV: 94 fL (ref 79–97)
Monocytes Absolute: 0.5 x10E3/uL (ref 0.1–0.9)
Monocytes: 5 %
Neutrophils Absolute: 4.6 x10E3/uL (ref 1.4–7.0)
Neutrophils: 56 %
Platelets: 294 x10E3/uL (ref 150–379)
Potassium: 3.9 mmol/L (ref 3.5–5.2)
RBC: 4.35 x10E6/uL (ref 3.77–5.28)
RDW: 13.1 % (ref 12.3–15.4)
Sodium: 141 mmol/L (ref 134–144)
TSH: 1.86 u[IU]/mL (ref 0.450–4.500)
Total Protein: 7.5 g/dL (ref 6.0–8.5)
WBC: 8.3 x10E3/uL (ref 3.4–10.8)

## 2016-05-21 ENCOUNTER — Ambulatory Visit (HOSPITAL_COMMUNITY)
Admission: EM | Admit: 2016-05-21 | Discharge: 2016-05-21 | Disposition: A | Discharge status: Home / Self Care | Payer: 59 | Attending: Internal Medicine | Admitting: Internal Medicine

## 2016-05-21 ENCOUNTER — Encounter (HOSPITAL_COMMUNITY): Payer: Self-pay | Admitting: Emergency Medicine

## 2016-05-21 DIAGNOSIS — K047 Periapical abscess without sinus: Secondary | ICD-10-CM

## 2016-05-21 MED ORDER — IBUPROFEN 800 MG PO TABS
800.0000 mg | ORAL_TABLET | Freq: Three times a day (TID) | ORAL | 0 refills | Status: DC
Start: 2016-05-21 — End: 2018-04-17

## 2016-05-21 MED ORDER — CLINDAMYCIN HCL 150 MG PO CAPS: 150.0000 mg | ORAL_CAPSULE | Freq: Four times a day (QID) | ORAL | 0 refills | Status: DC

## 2016-05-21 NOTE — ED Triage Notes (Signed)
Pt here for swelling of lymph node on left side of face/neck onset 3 days  Denies fevers, chills  Taking ibuprofen w/no relief.   A&O x4... NAD

## 2016-05-21 NOTE — ED Provider Notes (Signed)
CSN: [id number]     Arrival date & time 05/21/16  1858 History   First MD Initiated Contact with Patient 05/21/16 1935     Chief Complaint  Patient presents with  . Lymphadenopathy   (Consider location/radiation/quality/duration/timing/severity/associated sxs/prior Treatment) 31 year old female presents with a chief complaint of lymphadenopathy and dental pain. She states she began "E visit" with her primary care provider name indicated she had "lymphadenopathy" she states she's had pain is worse with chewing, and worse with cold temperatures. Pain is on her bottom jaw, on the left side. She denies any fever, chills, nausea, shortness of breath, or other systemic symptoms.   The history is provided by the patient.    Past Medical History:  Diagnosis Date  . Asthma    prn inhaler  . Derangement of posterior horn of medial meniscus 01/2014   left  . GERD (gastroesophageal reflux disease)    no current med.  . Hypertension    under control with med., has been on med. x 3 yr.  . Left ACL tear 01/2014  . Migraines    occasional  . Seasonal allergies    current nasal congestion (01/24/2014)   Past Surgical History:  Procedure Laterality Date  . KNEE ARTHROSCOPY WITH ANTERIOR CRUCIATE LIGAMENT (ACL) REPAIR Left 01/28/2014   Procedure: LEFT KNEE ARTHROSCOPY WITH MEDIAL MENISCECTOMY AND ANTERIOR CRUCIATE LIGAMENT REPAIR;  Surgeon: Loreta Ave, MD;  Location: [facility];  Service: Orthopedics;  Laterality: Left;  . ORIF ANKLE FRACTURE Left 07/19/2013   Procedure: LEFT ANKLE FRACTURE OPEN TREATMENT BIMALLEOLAR ANKLE INCLUDES INTERNAL FIXATION, LEFT ANKLE FRACTURE OPEN TREATMENT DISTAL TIBIOFIBULAR fasciotomy INCLUDES INTERNAL FIXATION repai of synosmosis;  Surgeon: Loreta Ave, MD;  Location: [facility];  Service: Orthopedics;  Laterality: Left;   Family History  Problem Relation Age of Onset  . Diabetes Maternal Grandfather   . Diabetes Mother     Social History  Substance Use Topics  . Smoking status: Never Smoker  . Smokeless tobacco: Never Used  . Alcohol use Yes     Comment: socially   OB History    No data available     Review of Systems  HENT: Positive for congestion and dental problem. Negative for sinus pain, sinus pressure and trouble swallowing.   Eyes: Negative.   Respiratory: Negative.   Cardiovascular: Negative.   Gastrointestinal: Negative.   Genitourinary: Negative.   Musculoskeletal: Negative.   Neurological: Negative.   All other systems reviewed and are negative.   Allergies  Patient has no known allergies.  Home Medications   Prior to Admission medications   Medication Sig Start Date End Date Taking? Authorizing Provider  levonorgestrel (MIRENA) 20 MCG/24HR IUD 1 each by Intrauterine route continuous.   Yes Historical Provider, MD  lisinopril-hydrochlorothiazide (PRINZIDE,ZESTORETIC) 20-12.5 MG tablet Take 1 tablet by mouth daily.   Yes Historical Provider, MD  albuterol (PROVENTIL HFA;VENTOLIN HFA) 108 (90 BASE) MCG/ACT inhaler Inhale 1 puff into the lungs every 6 (six) hours as needed for wheezing or shortness of breath.    Historical Provider, MD  clindamycin (CLEOCIN) 150 MG capsule Take 1 capsule (150 mg total) by mouth every 6 (six) hours. 05/21/16   Dorena Bodo, NP  DiphenhydrAMINE HCl 50 MG/30ML LIQD Take 30 mLs by mouth 2 (two) times daily as needed. Cough and cold symptoms    Historical Provider, MD  ibuprofen (ADVIL,MOTRIN) 800 MG tablet Take 1 tablet (800 mg total) by mouth 3 (three) times daily. 05/21/16  Dorena Bodo, NP  lisinopril (PRINIVIL,ZESTRIL) 10 MG tablet Take 1 tablet (10 mg total) by mouth daily. 02/18/16   Willodean Rosenthal, MD  loratadine (CLARITIN) 10 MG tablet Take 10 mg by mouth daily.    Historical Provider, MD  ondansetron (ZOFRAN) 4 MG tablet Take 1 tablet (4 mg total) by mouth every 8 (eight) hours as needed for nausea or vomiting. Patient not taking:  Reported on 02/18/2016 01/28/14   Cristie Hem, PA-C  oxyCODONE-acetaminophen (ROXICET) 5-325 MG per tablet Take 1-2 tablets by mouth every 4 (four) hours as needed. Patient not taking: Reported on 02/18/2016 01/28/14   Cristie Hem, PA-C  predniSONE (DELTASONE) 20 MG tablet 2 tabs po daily x 4 days Patient not taking: Reported on 02/18/2016 05/04/14   Tomasita Crumble, MD   Meds Ordered and Administered this Visit  Medications - No data to display  BP 123/81 (BP Location: Left Arm)   Pulse 85   Temp 98.6 F (37 C) (Oral)   Resp 16   SpO2 100%  No data found.   Physical Exam  Constitutional: She is oriented to person, place, and time. She appears well-developed and well-nourished. No distress.  HENT:  Head: Normocephalic and atraumatic.  Right Ear: External ear normal.  Left Ear: External ear normal.  Dental Cary noted at the 20th tooth, no evidence of dental abscess on physical exam. However noted malodorous breath.  Eyes: Conjunctivae are normal. Right eye exhibits no discharge. Left eye exhibits no discharge.  Neck: Normal range of motion. No JVD present.  Cardiovascular: Normal rate and regular rhythm.   Pulmonary/Chest: Effort normal and breath sounds normal.  Lymphadenopathy:       Head (right side): Submandibular and tonsillar adenopathy present. No submental and no preauricular adenopathy present.       Head (left side): Submandibular and tonsillar adenopathy present. No submental and no preauricular adenopathy present.    She has no cervical adenopathy.  Neurological: She is alert and oriented to person, place, and time.  Skin: Skin is warm and dry. Capillary refill takes less than 2 seconds. She is not diaphoretic.  Psychiatric: She has a normal mood and affect. Her behavior is normal.  Nursing note and vitals reviewed.   Urgent Care Course     Procedures (including critical care time)  Labs Review Labs Reviewed - No data to display  Imaging Review No results  found.     MDM   1. Dental infection    Suspicion for submandibular infection is low, most likely dental infection. Treating with clindamycin, may take Tylenol or ibuprofen for pain, recommended following up with the dentist for further evaluation due to dental caries.     Dorena Bodo, NP 05/21/16 1954

## 2016-05-21 NOTE — Discharge Instructions (Signed)
Based on your physical exam findings I believe you most likely have a dental infection. I have prescribed an antibiotic called clindamycin, take one tablet 4 times a day. This kind of antibiotic can be rough on your stomach, so take it with food, also eat plenty of yogurt, and consider a probiotic. For pain relief, I recommend combination of Tylenol and ibuprofen, studies of shown that this combination is very effective for pain relief. Do not take any more than 4000 mg a day of Tylenol, and do not take any more than 2400 mg a day of ibuprofen. If your symptoms do not improve, follow up with a dentist for further care.

## 2016-11-17 ENCOUNTER — Ambulatory Visit (INDEPENDENT_AMBULATORY_CARE_PROVIDER_SITE_OTHER): Payer: 59 | Admitting: Advanced Practice Midwife

## 2016-11-17 ENCOUNTER — Encounter: Payer: Self-pay | Admitting: Advanced Practice Midwife

## 2016-11-17 VITALS — BP 133/79 | HR 81 | Ht 68.0 in | Wt 243.6 lb

## 2016-11-17 DIAGNOSIS — Z30432 Encounter for removal of intrauterine contraceptive device: Secondary | ICD-10-CM

## 2016-11-17 NOTE — Patient Instructions (Signed)
Preparing for Pregnancy If you are considering becoming pregnant, make an appointment to see your regular health care provider to learn how to prepare for a safe and healthy pregnancy (preconception care). During a preconception care visit, your health care provider will:  Do a complete physical exam, including a Pap test.  Take a complete medical history.  Give you information, answer your questions, and help you resolve problems.  Preconception checklist Medical history  Tell your health care provider about any current or past medical conditions. Your pregnancy or your ability to become pregnant may be affected by chronic conditions, such as diabetes, chronic hypertension, and thyroid problems.  Include your family's medical history as well as your partner's medical history.  Tell your health care provider about any history of STIs (sexually transmitted infections).These can affect your pregnancy. In some cases, they can be passed to your baby. Discuss any concerns that you have about STIs.  If indicated, discuss the benefits of genetic testing. This testing will show whether there are any genetic conditions that may be passed from you or your partner to your baby.  Tell your health care provider about: ? Any problems you have had with conception or pregnancy. ? Any medicines you take. These include vitamins, herbal supplements, and over-the-counter medicines. ? Your history of immunizations. Discuss any vaccinations that you may need.  Diet  Ask your health care provider what to include in a healthy diet that has a balance of nutrients. This is especially important when you are pregnant or preparing to become pregnant.  Ask your health care provider to help you reach a healthy weight before pregnancy. ? If you are overweight, you may be at higher risk for certain complications, such as high blood pressure, diabetes, and preterm birth. ? If you are underweight, you are more likely  to have a baby who has a low birth weight.  Lifestyle, work, and home  Let your health care provider know: ? About any lifestyle habits that you have, such as alcohol use, drug use, or smoking. ? About recreational activities that may put you at risk during pregnancy, such as downhill skiing and certain exercise programs. ? Tell your health care provider about any international travel, especially any travel to places with an active Zika virus outbreak. ? About harmful substances that you may be exposed to at work or at home. These include chemicals, pesticides, radiation, or even litter boxes. ? If you do not feel safe at home.  Mental health  Tell your health care provider about: ? Any history of mental health conditions, including feelings of depression, sadness, or anxiety. ? Any medicines that you take for a mental health condition. These include herbs and supplements.  Home instructions to prepare for pregnancy Lifestyle  Eat a balanced diet. This includes fresh fruits and vegetables, whole grains, lean meats, low-fat dairy products, healthy fats, and foods that are high in fiber. Ask to meet with a nutritionist or registered dietitian for assistance with meal planning and goals.  Get regular exercise. Try to be active for at least 30 minutes a day on most days of the week. Ask your health care provider which activities are safe during pregnancy.  Do not use any products that contain nicotine or tobacco, such as cigarettes and e-cigarettes. If you need help quitting, ask your health care provider.  Do not drink alcohol.  Do not take illegal drugs.  Maintain a healthy weight. Ask your health care provider what weight range is   right for you.  General instructions  Keep an accurate record of your menstrual periods. This makes it easier for your health care provider to determine your baby's due date.  Begin taking prenatal vitamins and folic acid supplements daily as directed by  your health care provider.  Manage any chronic conditions, such as high blood pressure and diabetes, as told by your health care provider. This is important.  How do I know that I am pregnant? You may be pregnant if you have been sexually active and you miss your period. Symptoms of early pregnancy include:  Mild cramping.  Very light vaginal bleeding (spotting).  Feeling unusually tired.  Nausea and vomiting (morning sickness).  If you have any of these symptoms and you suspect that you might be pregnant, you can take a home pregnancy test. These tests check for a hormone in your urine (human chorionic gonadotropin, or hCG). A woman's body begins to make this hormone during early pregnancy. These tests are very accurate. Wait until at least the first day after you miss your period to take one. If the test shows that you are pregnant (you get a positive result), call your health care provider to make an appointment for prenatal care. What should I do if I become pregnant?  Make an appointment with your health care provider as soon as you suspect you are pregnant.  Do not use any products that contain nicotine, such as cigarettes, chewing tobacco, and e-cigarettes. If you need help quitting, ask your health care provider.  Do not drink alcoholic beverages. Alcohol is related to a number of birth defects.  Avoid toxic odors and chemicals.  You may continue to have sexual intercourse if it does not cause pain or other problems, such as vaginal bleeding. This information is not intended to replace advice given to you by your health care provider. Make sure you discuss any questions you have with your health care provider. Document Released: 01/08/2008 Document Revised: 09/23/2015 Document Reviewed: 08/17/2015 Elsevier Interactive Patient Education  2017 Elsevier Inc.  

## 2016-11-17 NOTE — Progress Notes (Signed)
Subjective:     Casey Mcmahon is a 31 y.o. female here for IUD removal.  Current complaints: none. She was seen in our office January 2018 for IUD removal but had HTN at that time and discussed this with provider, deciding to leave IUD in until BP was better managed. She now has a primary care provider and BP is well managed on medications.  She does not desire another form of contraception and may plan another pregnancy soon.   Gynecologic History No LMP recorded (lmp unknown). Patient is not currently having periods (Reason: IUD). Contraception: IUD, removed 11/17/16 Last Pap: 3 years ago. Results were: normal  Obstetric History OB History  Gravida Para Term Preterm AB Living  SAB TAB Ectopic Multiple Live Births    1     2    # Outcome Date GA Lbr Len/2nd Weight Sex Delivery Anes PTL Lv  3 Term 2007     Vag-Spont   LIV  2 TAB 2005          1 Term 2003     Vag-Spont   LIV       The following portions of the patient's history were reviewed and updated as appropriate: allergies, current medications, past family history, past medical history, past social history, past surgical history and problem list.  Review of Systems Pertinent items noted in HPI and remainder of comprehensive ROS otherwise negative.    Objective:     VS reviewed, nursing note reviewed,  Constitutional: well developed, well nourished, no distress HEENT: normocephalic CV: normal rate Pulm/chest wall: normal effort Abdomen: soft Neuro: alert and oriented x 3 Skin: warm, dry Psych: affect normal  IUD Removal  Patient was in the dorsal lithotomy position, normal external genitalia was noted.  A speculum was placed in the patient's vagina, normal discharge was noted, no lesions. The multiparous cervix was visualized, no lesions, no abnormal discharge.  The strings of the IUD were grasped and pulled using ring forceps. The IUD was removed in its entirety. Patient tolerated the procedure well.     Patient plans for pregnancy soon and she was told to avoid teratogens, take PNV and folic acid.  Routine preventative health maintenance measures emphasized.     Assessment:    Healthy female exam 1. Encounter for IUD removal    Plan:    Contraception: none.

## 2016-11-24 ENCOUNTER — Ambulatory Visit (INDEPENDENT_AMBULATORY_CARE_PROVIDER_SITE_OTHER): Payer: 59 | Admitting: Neurology

## 2016-11-24 ENCOUNTER — Encounter: Payer: Self-pay | Admitting: Neurology

## 2016-11-24 VITALS — BP 133/91 | HR 99 | Ht 68.0 in | Wt 241.0 lb

## 2016-11-24 DIAGNOSIS — J4520 Mild intermittent asthma, uncomplicated: Secondary | ICD-10-CM

## 2016-11-24 DIAGNOSIS — G4719 Other hypersomnia: Secondary | ICD-10-CM

## 2016-11-24 DIAGNOSIS — R51 Headache: Secondary | ICD-10-CM

## 2016-11-24 DIAGNOSIS — R0681 Apnea, not elsewhere classified: Secondary | ICD-10-CM

## 2016-11-24 DIAGNOSIS — R351 Nocturia: Secondary | ICD-10-CM

## 2016-11-24 DIAGNOSIS — E669 Obesity, unspecified: Secondary | ICD-10-CM

## 2016-11-24 DIAGNOSIS — R519 Headache, unspecified: Secondary | ICD-10-CM

## 2016-11-24 DIAGNOSIS — R0683 Snoring: Secondary | ICD-10-CM

## 2016-11-24 NOTE — Progress Notes (Signed)
Subjective:    Patient ID: Casey Mcmahon is a 31 y.o. female.  HPI     Huston FoleySaima Gwyn Hieronymus, MD, PhD [facility] [address], [address] [address] [place], [place] [address]  Dear Dr. Link SnufferHolwerda,   I saw your patient, Casey Mcmahon, upon your kind request in my neurologic clinic today for initial consultation of her sleep disorder, in particular, concern for underlying obstructive sleep apnea. The patient is unaccompanied today. As you know, Ms. Greenlaw is a 31 year old right-handed woman with an underlying medical history of asthma, left ankle fracture, left knee surgery for anterior cruciate ligament tear, reflux disease, hypertension, migraine headaches, seasonal allergies, hyperlipidemia, and obesity, who reports snoring and excessive daytime somnolence. I reviewed your office note from 09/22/2016, which you kindly included. She had a sleep study over 5 years ago. Prior sleep study results are not available for my review today, she reports that she has gained weight and her snoring has become worse and prior sleep study per her report was negative for OSA. She has never been on CPAP therapy. Her Epworth sleepiness score is 13 out of 24 today, fatigue score is 34 out of 63. She has some difficulty falling asleep, does not wake up rested. She lives with her husband and her children. She has 2 biological and 3 stepchildren. She works for [employer]. She is a nonsmoker and drinks alcohol occasionally, consumes caffeine daily in the form of soda, usually about one serving per day.  She does watch TV in her bedroom and sometimes is on her phone at night. She has a 33 yo and 33 yo biological children and 718 yo and 866 yo twins, stepkids. Kids do not typically come into her bedroom. She does not have any pets in her bedroom area. She has tried over-the-counter sleep aids including p.m. type medication, Zquil, melatonin. She had bad dreams on melatonin. She has been told by her husband,  that she has pauses in her breathing while asleep and snoring can be loud. Sleep study in the past was over 5 years ago.  She received a depomedrol IM injection for asthm flare on 09/22/16 in your office.  Time is around 10, wakeup time around 6:15 AM. She has nocturia about once or twice per average night and has had occasional morning headaches, in the past, she had more migraines and more morning headaches as well. When she had her previous sleep study was due to snoring and recurrent migrainous headaches. She does not endorse telltale restless leg symptoms. She is not aware of any family history of OSA.  Her Past Medical History Is Significant For: Past Medical History:  Diagnosis Date  . Asthma    prn inhaler  . Derangement of posterior horn of medial meniscus 01/2014   left  . GERD (gastroesophageal reflux disease)    no current med.  . Hypertension    under control with med., has been on med. x 3 yr.  . Left ACL tear 01/2014  . Migraines    occasional  . Seasonal allergies    current nasal congestion (01/24/2014)    Her Past Surgical History Is Significant For: Past Surgical History:  Procedure Laterality Date  . KNEE ARTHROSCOPY WITH ANTERIOR CRUCIATE LIGAMENT (ACL) REPAIR Left 01/28/2014   Procedure: LEFT KNEE ARTHROSCOPY WITH MEDIAL MENISCECTOMY AND ANTERIOR CRUCIATE LIGAMENT REPAIR;  Surgeon: Loreta Aveaniel F Murphy, MD;  Location: [facility];  Service: Orthopedics;  Laterality: Left;  . ORIF ANKLE FRACTURE Left  07/19/2013   Procedure: LEFT ANKLE FRACTURE OPEN TREATMENT BIMALLEOLAR ANKLE INCLUDES INTERNAL FIXATION, LEFT ANKLE FRACTURE OPEN TREATMENT DISTAL TIBIOFIBULAR fasciotomy INCLUDES INTERNAL FIXATION repai of synosmosis;  Surgeon: Loreta Ave, MD;  Location: [facility];  Service: Orthopedics;  Laterality: Left;    Her Family History Is Significant For: Family History  Problem Relation Age of Onset  . Diabetes Maternal Grandfather   .  Diabetes Mother   . Alcoholism Mother   . Diabetes Father   . Hypertension Father     Her Social History Is Significant For: Social History   Social History  . Marital status: Single    Spouse name: N/A  . Number of children: N/A  . Years of education: N/A   Social History Main Topics  . Smoking status: Never Smoker  . Smokeless tobacco: Never Used  . Alcohol use Yes     Comment: socially  . Drug use: No  . Sexual activity: Yes    Birth control/ protection: IUD   Other Topics Concern  . None   Social History Narrative  . None    Her Allergies Are:  No Known Allergies:   Her Current Medications Are:  Outpatient Encounter Prescriptions as of 11/24/2016  Medication Sig  . albuterol (PROVENTIL HFA;VENTOLIN HFA) 108 (90 BASE) MCG/ACT inhaler Inhale 1 puff into the lungs every 6 (six) hours as needed for wheezing or shortness of breath.  Marland Kitchen albuterol (PROVENTIL) (2.5 MG/3ML) 0.083% nebulizer solution Take 2.5 mg by nebulization every 6 (six) hours as needed for wheezing or shortness of breath.  . DiphenhydrAMINE HCl 50 MG/30ML LIQD Take 30 mLs by mouth 2 (two) times daily as needed. Cough and cold symptoms  . fluticasone (FLONASE) 50 MCG/ACT nasal spray Place into both nostrils daily.  . fluticasone furoate-vilanterol (BREO ELLIPTA) 100-25 MCG/INH AEPB Inhale 1 puff into the lungs as needed.  Marland Kitchen ibuprofen (ADVIL,MOTRIN) 800 MG tablet Take 1 tablet (800 mg total) by mouth 3 (three) times daily.  Marland Kitchen lisinopril-hydrochlorothiazide (PRINZIDE,ZESTORETIC) 20-12.5 MG tablet Take 1 tablet by mouth daily.  Marland Kitchen loratadine (CLARITIN) 10 MG tablet Take 10 mg by mouth daily.  . methocarbamol (ROBAXIN) 500 MG tablet Take 500 mg by mouth every 6 (six) hours as needed for muscle spasms.  . [DISCONTINUED] levonorgestrel (MIRENA) 20 MCG/24HR IUD 1 each by Intrauterine route continuous.   No facility-administered encounter medications on file as of 11/24/2016.   :  Review of Systems:  Out of a  complete 14 point review of systems, all are reviewed and negative with the exception of these symptoms as listed below:  Review of Systems  Neurological:       Pt presents today to discuss her sleep. Pt has had a sleep study in the past but has never been on a cpap machine. Pt does endorse snoring.  Epworth Sleepiness Scale 0= would never doze 1= slight chance of dozing 2= moderate chance of dozing 3= high chance of dozing  Sitting and reading: 2 Watching TV: 2 Sitting inactive in a public place (ex. Theater or meeting): 1 As a passenger in a car for an hour without a break: 3 Lying down to rest in the afternoon: 2 Sitting and talking to someone: 0 Sitting quietly after lunch (no alcohol): 2 In a car, while stopped in traffic: 1 Total: 13     Objective:  Neurological Exam  Physical Exam Physical Examination:   Vitals:   11/24/16 1558  BP: (!) 133/91  Pulse: 99  General Examination: The patient is a very pleasant 31 y.o. female in no acute distress. She appears well-developed and well-nourished and well groomed.   HEENT: Normocephalic, atraumatic, pupils are equal, round and reactive to light and accommodation. Funduscopic exam is normal with sharp disc margins noted. Extraocular tracking is good without limitation to gaze excursion or nystagmus noted. Normal smooth pursuit is noted. Hearing is grossly intact. Tympanic membranes are clear bilaterally. Face is symmetric with normal facial animation and normal facial sensation. Speech is clear with no dysarthria noted. There is no hypophonia. There is no lip, neck/head, jaw or voice tremor. Neck is supple with full range of passive and active motion. There are no carotid bruits on auscultation. Oropharynx exam reveals: mild mouth dryness, good dental hygiene and moderate airway crowding, due to smaller airway entry, wider uvula, tonsils of 1-2+ bilaterally. Mallampati is class II. Incidental small wisp like ending. Neck  circumference is 16-3/8 inches. She has a mild to moderate overbite. Tongue protrudes centrally and palate elevates symmetrically.   Chest: Clear to auscultation without wheezing, rhonchi or crackles noted.  Heart: S1+S2+0, regular and normal without murmurs, rubs or gallops noted.   Abdomen: Soft, non-tender and non-distended with normal bowel sounds appreciated on auscultation.  Extremities: There is no pitting edema in the distal lower extremities bilaterally. Pedal pulses are intact.  Skin: Warm and dry without trophic changes noted.  Musculoskeletal: exam reveals no obvious joint deformities, tenderness or joint swelling or erythema.   Neurologically:  Mental status: The patient is awake, alert and oriented in all 4 spheres. Her immediate and remote memory, attention, language skills and fund of knowledge are appropriate. There is no evidence of aphasia, agnosia, apraxia or anomia. Speech is clear with normal prosody and enunciation. Thought process is linear. Mood is normal and affect is normal.  Cranial nerves II - XII are as described above under HEENT exam. In addition: shoulder shrug is normal with equal shoulder height noted. Motor exam: Normal bulk, strength and tone is noted. There is no drift, tremor or rebound. Romberg is negative. Reflexes are 1+ in the upper extremities and trace in the lower extremities. Fine motor skills and coordination: intact with normal finger taps, normal hand movements, normal rapid alternating patting, normal foot taps and normal foot agility.  Cerebellar testing: No dysmetria or intention tremor on finger to nose testing. Heel to shin is unremarkable bilaterally. There is no truncal or gait ataxia.  Sensory exam: intact to light touch in the upper and lower extremities.  Gait, station and balance: She stands easily. No veering to one side is noted. No leaning to one side is noted. Posture is age-appropriate and stance is narrow based. Gait shows normal  stride length and normal pace. No problems turning are noted. Tandem walk is unremarkable.   Assessment and Plan:   In summary, ADRIEN DIETZMAN is a very pleasant 31 y.o.-year old female with an underlying medical history of asthma, left ankle fracture, left knee surgery for anterior cruciate ligament tear, reflux disease, hypertension, migraine headaches, seasonal allergies, hyperlipidemia, and obesity, whose history and physical exam are concerning for obstructive sleep apnea (OSA). I had a long chat with the patient about my findings and the diagnosis of OSA, its prognosis and treatment options. We talked about medical treatments, surgical interventions and non-pharmacological approaches. I explained in particular the risks and ramifications of untreated moderate to severe OSA, especially with respect to developing cardiovascular disease down the Road, including congestive heart failure, difficult  to treat hypertension, cardiac arrhythmias, or stroke. Even type 2 diabetes has, in part, been linked to untreated OSA. Symptoms of untreated OSA include daytime sleepiness, memory problems, mood irritability and mood disorder such as depression and anxiety, lack of energy, as well as recurrent headaches, especially morning headaches. We talked about trying to maintain a healthy lifestyle in general, as well as the importance of weight control. I encouraged the patient to eat healthy, exercise daily and keep well hydrated, to keep a scheduled bedtime and wake time routine, to not skip any meals and eat healthy snacks in between meals. I advised the patient not to drive when feeling sleepy. I recommended the following at this time: sleep study with potential positive airway pressure titration. (We will score hypopneas at 4%).   I explained the sleep test procedure to the patient and also outlined possible treatment options of OSA, including CPAP treatment. She indicated that she would be willing to try CPAP if  the need arises. I explained the importance of being compliant with PAP treatment, not only for insurance purposes but primarily to improve Her symptoms, and for the patient's long term health benefit, including to reduce Her cardiovascular risks. I answered all her questions today and the patient was in agreement. I would like to see her back after the sleep study is completed and encouraged her to call with any interim questions, concerns, problems or updates.   Thank you very much for allowing me to participate in the care of this nice patient. If I can be of any further assistance to you please do not hesitate to call me at [phone].  Sincerely,   Huston Foley, MD, PhD

## 2016-11-24 NOTE — Patient Instructions (Signed)

## 2016-12-13 ENCOUNTER — Ambulatory Visit (INDEPENDENT_AMBULATORY_CARE_PROVIDER_SITE_OTHER): Payer: 59 | Admitting: Neurology

## 2016-12-13 DIAGNOSIS — G4710 Hypersomnia, unspecified: Secondary | ICD-10-CM | POA: Diagnosis not present

## 2016-12-13 DIAGNOSIS — R0683 Snoring: Secondary | ICD-10-CM

## 2016-12-13 DIAGNOSIS — G4719 Other hypersomnia: Secondary | ICD-10-CM

## 2016-12-21 ENCOUNTER — Other Ambulatory Visit: Payer: Self-pay | Admitting: Neurology

## 2016-12-21 ENCOUNTER — Telehealth: Payer: Self-pay | Admitting: Neurology

## 2016-12-21 DIAGNOSIS — R0683 Snoring: Secondary | ICD-10-CM

## 2016-12-21 DIAGNOSIS — G4719 Other hypersomnia: Secondary | ICD-10-CM

## 2016-12-21 NOTE — Telephone Encounter (Signed)
I called pt to discuss her sleep study results. No answer, left a message asking her to call me back. 

## 2016-12-21 NOTE — Telephone Encounter (Signed)
[facility] [address] [place], [place] [address] NAME:      Casey Mcmahon                                                       DOB: 12/01/1985 MEDICAL RECORD NUMBER [id number]                                             DOS: 12/13/16 REFERRING PHYSICIAN: Alysia PennaScott Holwerda, MD STUDY PERFORMED: Home Sleep Study  HISTORY: 31 year old woman with a history of asthma, left ankle fracture, left knee surgery for anterior cruciate ligament tear, reflux disease, hypertension, migraine headaches, seasonal allergies, hyperlipidemia, and obesity, who reports snoring and excessive daytime somnolence. Her Epworth sleepiness score is 13 out of 24 today, BMI of 36.6.   STUDY RESULTS: Total Duration of Valid Test Time: 7 Hours, 36 Minutes Total Apnea/Hypopnea Index (AHI): 2.5/Hour Average Oxygen Saturation:    95% Lowest Oxygen Saturation:   88%  Time Oxygen Saturation Below or at 88%:    0 min Average Heart Rate:       74 BPM IMPRESSION:  Sleep disturbance RECOMMENDATION:   This home sleep test does not demonstrate any significant obstructive or central sleep disordered breathing. Other causes of the patient's symptoms, including circadian rhythm disturbances, an underlying mood disorder, medication effect and/or an underlying medical problem cannot be ruled out based on this test. Clinical correlation is recommended. The patient should be cautioned not to drive, work at heights, or operate dangerous or heavy equipment when tired or sleepy. Review and reiteration of good sleep hygiene measures should be pursued with any patient. The patient and her referring provider will be notified of the test results; the patient will be advised to follow up with her referring doctor.   I certify that I have reviewed the raw data recording prior to the issuance of this report in accordance with the standards of the American Academy of Sleep Medicine (AASM).  Huston FoleySaima Earmon Sherrow, MD, PhD Diplomat,  ABPN (Neurology and Sleep)

## 2016-12-21 NOTE — Telephone Encounter (Signed)
Could not create results in the HST encounter.  Patient referred by Dr. Link SnufferHolwerda, seen by me on 11/24/16, HST on 12/13/16.   Please call and notify the patient that the recent home sleep test did not show any significant obstructive sleep apnea. Patient can follow up with the referring provider. Please remind patient to try to maintain good sleep hygiene, which means: Keep a regular sleep and wake schedule and make enough time for sleep (7 1/2 to 8 1/2 hours for the average adult), try not to exercise or have a meal within 2 hours of your bedtime, try to keep your bedroom conducive for sleep, that is, cool and dark, without light distractors such as an illuminated alarm clock, and refrain from watching TV right before sleep or in the middle of the night and do not keep the TV or radio on during the night. If a nightlight is used, have it away from the visual field. Also, try not to use or play on electronic devices at bedtime, such as your cell phone, tablet PC or laptop. If you like to read at bedtime on an electronic device, try to dim the background light as much as possible. Do not eat in the middle of the night. Keep pets away from the bedroom environment. For stress relief, try meditation, deep breathing exercises (there are many books and CDs available), a white noise machine or fan can help to diffuse other noise distractors, such as traffic noise. Do not drink alcohol before bedtime, as it can disturb sleep and cause middle of the night awakenings. Never mix alcohol and sedating medications! Avoid narcotic pain medication close to bedtime, as opioids/narcotics can suppress breathing drive and breathing effort.   Once you have spoken to patient, you can close this encounter.   Thanks,  Huston FoleySaima Jordyne Poehlman, MD, PhD [facility])

## 2016-12-22 NOTE — Telephone Encounter (Signed)
Patient is returning your call.  

## 2016-12-22 NOTE — Telephone Encounter (Signed)
I called pt. I advised pt that Dr. Frances FurbishAthar reviewed pt's sleep study and found that pt did not show any significant osa. Dr. Frances FurbishAthar recommends that pt follow up with Dr. Link SnufferHolwerda. I reviewed sleep hygiene recommendations with the pt, including trying to keep a regular sleep wake schedule, avoiding electronics in the bedroom, keeping the bedroom cool, dark, and quiet, and avoiding eating or exercising within 2 hours of bedtime as well as eating in the middle of the night. I advised pt to keep pets away from the bedtime. I discussed with pt the importance of stress relief and to try meditation, deep breathing exercises, and/or a white noise machine or fan to diffuse other noise distracters. I advised pt to not drink alcohol before bedtime and to never mix alcohol and sedating medications. Pt was advised to avoid narcotic pain medication close to bedtime. I advised pt that a copy of these sleep study results will be sent to Dr. Link SnufferHolwerda. Pt verbalized understanding of results. Pt had no questions at this time but was encouraged to call back if questions arise.

## 2017-01-15 ENCOUNTER — Other Ambulatory Visit: Payer: Self-pay

## 2017-01-15 ENCOUNTER — Encounter (HOSPITAL_COMMUNITY): Payer: Self-pay | Admitting: Emergency Medicine

## 2017-01-15 ENCOUNTER — Emergency Department (HOSPITAL_COMMUNITY)
Admission: EM | Admit: 2017-01-15 | Discharge: 2017-01-15 | Disposition: A | Discharge status: Home / Self Care | Payer: 59 | Attending: Emergency Medicine | Admitting: Emergency Medicine

## 2017-01-15 DIAGNOSIS — I10 Essential (primary) hypertension: Secondary | ICD-10-CM | POA: Diagnosis not present

## 2017-01-15 DIAGNOSIS — R112 Nausea with vomiting, unspecified: Secondary | ICD-10-CM | POA: Insufficient documentation

## 2017-01-15 DIAGNOSIS — Z79899 Other long term (current) drug therapy: Secondary | ICD-10-CM | POA: Insufficient documentation

## 2017-01-15 DIAGNOSIS — J45909 Unspecified asthma, uncomplicated: Secondary | ICD-10-CM | POA: Diagnosis not present

## 2017-01-15 DIAGNOSIS — R197 Diarrhea, unspecified: Secondary | ICD-10-CM | POA: Diagnosis not present

## 2017-01-15 LAB — BASIC METABOLIC PANEL
ANION GAP: 9 (ref 5–15)
BUN: 14 mg/dL (ref 6–20)
CALCIUM: 8.8 mg/dL — AB (ref 8.9–10.3)
CO2: 24 mmol/L (ref 22–32)
Chloride: 102 mmol/L (ref 101–111)
Creatinine, Ser: 0.87 mg/dL (ref 0.44–1.00)
GFR calc non Af Amer: >60 mL/min (ref >60)
GLUCOSE: 135 mg/dL — AB (ref 65–99)
Potassium: 3.5 mmol/L (ref 3.5–5.1)
Sodium: 135 mmol/L (ref 135–145)

## 2017-01-15 LAB — URINALYSIS, ROUTINE W REFLEX MICROSCOPIC
BILIRUBIN URINE: NEGATIVE
GLUCOSE, UA: NEGATIVE mg/dL
HGB URINE DIPSTICK: NEGATIVE
Ketones, ur: NEGATIVE mg/dL
Leukocytes, UA: NEGATIVE
Nitrite: NEGATIVE
PH: 5 (ref 5.0–8.0)
Protein, ur: NEGATIVE mg/dL
SPECIFIC GRAVITY, URINE: 1.025 (ref 1.005–1.030)

## 2017-01-15 LAB — CBC WITH DIFFERENTIAL/PLATELET
BASOS ABS: 0 10*3/uL (ref 0.0–0.1)
BASOS PCT: 0 %
EOS PCT: 1 %
Eosinophils Absolute: 0.1 10*3/uL (ref 0.0–0.7)
HEMATOCRIT: 37.6 % (ref 36.0–46.0)
Hemoglobin: 13.1 g/dL (ref 12.0–15.0)
Lymphocytes Relative: 17 %
Lymphs Abs: 1.6 10*3/uL (ref 0.7–4.0)
MCH: 31.8 pg (ref 26.0–34.0)
MCHC: 34.8 g/dL (ref 30.0–36.0)
MCV: 91.3 fL (ref 78.0–100.0)
Monocytes Absolute: 0.5 10*3/uL (ref 0.1–1.0)
Monocytes Relative: 5 %
NEUTROS ABS: 7.1 10*3/uL (ref 1.7–7.7)
Neutrophils Relative %: 77 %
PLATELETS: 252 10*3/uL (ref 150–400)
RBC: 4.12 MIL/uL (ref 3.87–5.11)
RDW: 12.3 % (ref 11.5–15.5)
WBC: 9.2 10*3/uL (ref 4.0–10.5)

## 2017-01-15 LAB — LIPASE, BLOOD: Lipase: 26 U/L (ref 11–51)

## 2017-01-15 LAB — POC URINE PREG, ED: Preg Test, Ur: NEGATIVE

## 2017-01-15 MED ORDER — GI COCKTAIL ~~LOC~~
30.0000 mL | Freq: Once | ORAL | Status: AC
  Administered 2017-01-15: 30 mL via ORAL
  Filled 2017-01-15: qty 30

## 2017-01-15 MED ORDER — METOCLOPRAMIDE HCL 10 MG PO TABS: 10.0000 mg | ORAL_TABLET | Freq: Four times a day (QID) | ORAL | 0 refills | Status: DC

## 2017-01-15 MED ORDER — SODIUM CHLORIDE 0.9 % IV BOLUS (SEPSIS)
1000.0000 mL | Freq: Once | INTRAVENOUS | Status: AC
  Administered 2017-01-15: 1000 mL via INTRAVENOUS

## 2017-01-15 MED ORDER — METOCLOPRAMIDE HCL 5 MG/ML IJ SOLN
10.0000 mg | Freq: Once | INTRAMUSCULAR | Status: AC
  Administered 2017-01-15: 10 mg via INTRAVENOUS
  Filled 2017-01-15: qty 2

## 2017-01-15 NOTE — Discharge Instructions (Signed)
Please read and follow all provided instructions.  Your diagnoses today include:  1. Diarrhea, unspecified type   2. Non-intractable vomiting with nausea, unspecified vomiting type     Tests performed today include: Blood counts and electrolytes Blood tests to check liver and kidney function Blood tests to check pancreas function Urine test to look for infection and pregnancy (in women) Vital signs. See below for your results today.   Medications prescribed:   Take any prescribed medications only as directed.  You may take Reglan for your nausea.  This may be taken every 6 hours.  You may take Imodium for your diarrhea.  This is over-the-counter.  Home care instructions:  Follow any educational materials contained in this packet.  Your abdominal pain, nausea, vomiting, and diarrhea may be caused by a viral gastroenteritis also called 'stomach flu'. You should rest for the next several days. Keep drinking plenty of fluids and use the medicine for nausea as directed.   Drink clear liquids for the next 24 hours and introduce solid foods slowly after 24 hours using the b.r.a.t. diet (Bananas, Rice, Applesauce, Toast, Yogurt).    Follow-up instructions: Please follow-up with your primary care provider in the next 2 days for further evaluation of your symptoms. If you are not feeling better in 48 hours you may have a condition that is more serious and you need re-evaluation.   Return instructions:  SEEK IMMEDIATE MEDICAL ATTENTION IF: If you have pain that does not go away or becomes severe  A temperature above 101F develops  Repeated vomiting occurs (multiple episodes)  If you have pain that becomes localized to portions of the abdomen. The right side could possibly be appendicitis. In an adult, the left lower portion of the abdomen could be colitis or diverticulitis.  Blood is being passed in stools or vomit (bright red or black tarry stools)  You develop chest pain, difficulty  breathing, dizziness or fainting, or become confused, poorly responsive, or inconsolable (young children) If you have any other emergent concerns regarding your health  Additional Information: Abdominal (belly) pain can be caused by many things. Your caregiver performed an examination and possibly ordered blood/urine tests and imaging (CT scan, x-rays, ultrasound). Many cases can be observed and treated at home after initial evaluation in the emergency department. Even though you are being discharged home, abdominal pain can be unpredictable. Therefore, you need a repeated exam if your pain does not resolve, returns, or worsens. Most patients with abdominal pain don't have to be admitted to the hospital or have surgery, but serious problems like appendicitis and gallbladder attacks can start out as nonspecific pain. Many abdominal conditions cannot be diagnosed in one visit, so follow-up evaluations are very important.  Your vital signs today were: BP (!) 150/112 (BP Location: Right Arm)    Pulse (!) 105    Temp 98.3 F (36.8 C) (Oral)    Resp 18    Ht 5\' 8"  (1.727 m)    Wt 108.9 kg (240 lb)    LMP 11/20/2016 Comment: Pt had IUD removed and is only having light spotting   SpO2 98%    BMI 36.49 kg/m  If your blood pressure (bp) was elevated above 135/85 this visit, please have this repeated by your doctor within one month. --------------

## 2017-01-15 NOTE — ED Provider Notes (Signed)
[facility]-EMERGENCY DEPT Provider Note   CSN: [id number] Arrival date & time: 01/15/17  4098     History   Chief Complaint Chief Complaint  Patient presents with  . Emesis  . Diarrhea    HPI Casey Mcmahon is a 31 y.o. female.  HPI  Patient is a 31-year female with a history of asthma, hypertension, and gastroesophageal reflux and no abdominal surgical history presented for nausea, vomiting, and diarrhea.  Patient reports that the emesis began approximately 36 hours ago and she has been able to maintain minimal p.o. intake over the last day and a half.  Patient reports that diarrhea began approximately 24 hours ago.  Patient has not noted any hematemesis, melena, or hematochezia.  No fevers or chills.  Patient has had some epigastric abdominal discomfort, that she reports is improved by emesis.  No dysuria or vaginal discharge.  Patient reports she has had a day of vaginal spotting blood prior to the onset of this presentation.  No recent sick contacts that she is aware of.  Patient does take meloxicam daily for wrist pain.  Last alcohol use 1 month ago.  Past Medical History:  Diagnosis Date  . Asthma    prn inhaler  . Derangement of posterior horn of medial meniscus 01/2014   left  . GERD (gastroesophageal reflux disease)    no current med.  . Hypertension    under control with med., has been on med. x 3 yr.  . Left ACL tear 01/2014  . Migraines    occasional  . Seasonal allergies    current nasal congestion (01/24/2014)    There are no active problems to display for this patient.   Past Surgical History:  Procedure Laterality Date  . KNEE ARTHROSCOPY WITH ANTERIOR CRUCIATE LIGAMENT (ACL) REPAIR Left 01/28/2014   Procedure: LEFT KNEE ARTHROSCOPY WITH MEDIAL MENISCECTOMY AND ANTERIOR CRUCIATE LIGAMENT REPAIR;  Surgeon: Loreta Ave, MD;  Location: [facility];  Service: Orthopedics;  Laterality: Left;  . ORIF ANKLE FRACTURE Left  07/19/2013   Procedure: LEFT ANKLE FRACTURE OPEN TREATMENT BIMALLEOLAR ANKLE INCLUDES INTERNAL FIXATION, LEFT ANKLE FRACTURE OPEN TREATMENT DISTAL TIBIOFIBULAR fasciotomy INCLUDES INTERNAL FIXATION repai of synosmosis;  Surgeon: Loreta Ave, MD;  Location: [facility];  Service: Orthopedics;  Laterality: Left;    OB History    Gravida Para Term Preterm AB Living   3 2 2   1 2    SAB TAB Ectopic Multiple Live Births     1     2       Home Medications    Prior to Admission medications   Medication Sig Start Date End Date Taking? Authorizing Provider  albuterol (PROVENTIL HFA;VENTOLIN HFA) 108 (90 BASE) MCG/ACT inhaler Inhale 1 puff into the lungs every 6 (six) hours as needed for wheezing or shortness of breath.    [provider]  albuterol (PROVENTIL) (2.5 MG/3ML) 0.083% nebulizer solution Take 2.5 mg by nebulization every 6 (six) hours as needed for wheezing or shortness of breath.    [provider]  DiphenhydrAMINE HCl 50 MG/30ML LIQD Take 30 mLs by mouth 2 (two) times daily as needed. Cough and cold symptoms    [provider]  fluticasone (FLONASE) 50 MCG/ACT nasal spray Place into both nostrils daily.    [provider]  fluticasone furoate-vilanterol (BREO ELLIPTA) 100-25 MCG/INH AEPB Inhale 1 puff into the lungs as needed.    [provider]  ibuprofen (ADVIL,MOTRIN)  800 MG tablet Take 1 tablet (800 mg total) by mouth 3 (three) times daily. 05/21/16   Dorena BodoKennard, Lawrence, NP  lisinopril-hydrochlorothiazide (PRINZIDE,ZESTORETIC) 20-12.5 MG tablet Take 1 tablet by mouth daily.    [provider]  loratadine (CLARITIN) 10 MG tablet Take 10 mg by mouth daily.    [provider]  methocarbamol (ROBAXIN) 500 MG tablet Take 500 mg by mouth every 6 (six) hours as needed for muscle spasms.    [provider]    Family History Family History  Problem Relation Age of Onset  . Diabetes Maternal Grandfather    . Diabetes Mother   . Alcoholism Mother   . Diabetes Father   . Hypertension Father     Social History Social History   Tobacco Use  . Smoking status: Never Smoker  . Smokeless tobacco: Never Used  Substance Use Topics  . Alcohol use: Yes    Comment: socially  . Drug use: No     Allergies   Patient has no known allergies.   Review of Systems Review of Systems  Constitutional: Negative for chills and fever.  Respiratory: Negative for cough and shortness of breath.   Cardiovascular: Negative for chest pain.  Gastrointestinal: Positive for diarrhea, nausea and vomiting. Negative for blood in stool.       + epigastric discomfort  Genitourinary: Negative for dysuria.  Skin: Negative for rash.  Neurological: Negative for headaches.  All other systems reviewed and are negative.    Physical Exam Updated Vital Signs BP (!) 150/112 (BP Location: Right Arm)   Pulse (!) 105   Temp 98.3 F (36.8 C) (Oral)   Resp 18   Ht 5\' 8"  (1.727 m)   Wt 108.9 kg (240 lb)   LMP 11/20/2016 Comment: Pt had IUD removed and is only having light spotting  SpO2 98%   BMI 36.49 kg/m   Physical Exam  Constitutional: She appears well-developed and well-nourished. No distress.  HENT:  Head: Normocephalic and atraumatic.  Mouth/Throat: Oropharynx is clear and moist.  Eyes: Conjunctivae and EOM are normal. Pupils are equal, round, and reactive to light.  Neck: Normal range of motion. Neck supple.  Cardiovascular: Normal rate, regular rhythm, S1 normal and S2 normal.  No murmur heard. Pulmonary/Chest: Effort normal and breath sounds normal. She has no wheezes. She has no rales.  Abdominal: Soft. Bowel sounds are normal. She exhibits no distension. There is no guarding.  Nonsurgical abdomen.  There is some mild epigastric discomfort.  No focal tenderness.  Musculoskeletal: Normal range of motion. She exhibits no edema or deformity.  Lymphadenopathy:    She has no cervical adenopathy.    Neurological: She is alert.  Cranial nerves grossly intact. Patient was extremities symmetrically and with good coordination.  Skin: Skin is warm and dry. No rash noted. No erythema.  Psychiatric: She has a normal mood and affect. Her behavior is normal. Judgment and thought content normal.  Nursing note and vitals reviewed.    ED Treatments / Results  Labs (all labs ordered are listed, but only abnormal results are displayed) Labs Reviewed  CBC WITH DIFFERENTIAL/PLATELET  BASIC METABOLIC PANEL  LIPASE, BLOOD  URINALYSIS, ROUTINE W REFLEX MICROSCOPIC  POC URINE PREG, ED    EKG  EKG Interpretation None       Radiology No results found.  Procedures Procedures (including critical care time)  Medications Ordered in ED Medications  sodium chloride 0.9 % bolus 1,000 mL (not administered)  metoCLOPramide (REGLAN) injection 10 mg (  not administered)  gi cocktail (Maalox,Lidocaine,Donnatal) (not administered)     Initial Impression / Assessment and Plan / ED Course  I have reviewed the triage vital signs and the nursing notes.  Pertinent labs & imaging results that were available during my care of the patient were reviewed by me and considered in my medical decision making (see chart for details).      Final Clinical Impressions(s) / ED Diagnoses   Final diagnoses:  None   Differential diagnosis includes viral gastroenteritis, infectious diarrhea, gastritis, pregnancy.  Will initiate BMP, CBC, lipase, UA, urine pregnancy.  Normal saline, Reglan, and GI cocktail.  Will reassess after therapy.  After antiemetics and fluids, patient was symptomatically nausea.  Patient reports that her epigastric discomfort improved with GI cocktail.  P.o. challenge successful.  Electrolytes normal.  Lipase normal.  Urinalysis without abnormality.  Patient deemed stable for discharge.  This is likely viral gastroenteritis.  Element of gastritis possible, however patient is already on  Prilosec therapy, and followed by her primary physician for reflux.  No indication at this time to culture her stool, as patient has had no fevers or bloody diarrhea,   As well as the time course of symptoms being only 24 hours.  I discussed with patient the evolving nature of gastroenteritis, and encouraged reevaluation for any fevers or bloody stools, or focal abdominal pain.  This is a supervised visit with Dr. Doug SouSam Jacubowitz. Evaluation, management, and discharge planning discussed with this attending physician.  ED Discharge Orders    None       Delia ChimesMurray, Palma Buster B, PA-C 01/15/17 0844    Melene PlanFloyd, Dan, DO 01/15/17 2301

## 2017-01-15 NOTE — ED Triage Notes (Signed)
Pt arriving with complaints of N/V/D x 2 days. Pt reports not taking any medications at home to relieve symptoms. Pt reports having 5 episodes of emesis in the last 2 days.

## 2017-01-15 NOTE — ED Notes (Signed)
Patient given oral fluids at this time. Advised to report any symptoms of n/v or discomfort.

## 2017-02-14 ENCOUNTER — Telehealth: Payer: Self-pay | Admitting: General Practice

## 2017-02-14 ENCOUNTER — Other Ambulatory Visit: Payer: Self-pay | Admitting: Family Medicine

## 2017-02-14 DIAGNOSIS — N921 Excessive and frequent menstruation with irregular cycle: Secondary | ICD-10-CM

## 2017-02-14 DIAGNOSIS — Z975 Presence of (intrauterine) contraceptive device: Principal | ICD-10-CM

## 2017-02-14 MED ORDER — NORGESTIMATE-ETH ESTRADIOL 0.25-35 MG-MCG PO TABS: 1.0000 | ORAL_TABLET | Freq: Every day | ORAL | 0 refills | Status: DC

## 2017-02-14 NOTE — Telephone Encounter (Signed)
Patient called and left message on nurse line stating she had her IUD removed 10/10. Patient states she has been on her period for a month now & wants to know if this is normal. Spoke with Dr Adrian BlackwaterStinson, who states OCPs x1 month can be called in to resolve bleeding but irregular bleeding post removal can be normal. Called and discussed with patient. Patient verbalized understanding & had no questions

## 2018-02-02 ENCOUNTER — Encounter: Payer: Self-pay | Admitting: Obstetrics & Gynecology

## 2018-02-02 ENCOUNTER — Ambulatory Visit: Payer: 59 | Admitting: Obstetrics & Gynecology

## 2018-02-02 VITALS — BP 138/104 | HR 89 | Wt 245.0 lb

## 2018-02-02 DIAGNOSIS — N760 Acute vaginitis: Secondary | ICD-10-CM

## 2018-02-02 DIAGNOSIS — Z23 Encounter for immunization: Secondary | ICD-10-CM

## 2018-02-02 DIAGNOSIS — B9689 Other specified bacterial agents as the cause of diseases classified elsewhere: Secondary | ICD-10-CM | POA: Diagnosis not present

## 2018-02-02 DIAGNOSIS — E669 Obesity, unspecified: Secondary | ICD-10-CM | POA: Insufficient documentation

## 2018-02-02 DIAGNOSIS — N898 Other specified noninflammatory disorders of vagina: Secondary | ICD-10-CM

## 2018-02-02 DIAGNOSIS — E6609 Other obesity due to excess calories: Secondary | ICD-10-CM

## 2018-02-02 DIAGNOSIS — Z1151 Encounter for screening for human papillomavirus (HPV): Secondary | ICD-10-CM | POA: Diagnosis not present

## 2018-02-02 DIAGNOSIS — Z01419 Encounter for gynecological examination (general) (routine) without abnormal findings: Secondary | ICD-10-CM | POA: Diagnosis not present

## 2018-02-02 DIAGNOSIS — Z124 Encounter for screening for malignant neoplasm of cervix: Secondary | ICD-10-CM

## 2018-02-02 DIAGNOSIS — I10 Essential (primary) hypertension: Secondary | ICD-10-CM | POA: Insufficient documentation

## 2018-02-02 MED ORDER — LETROZOLE 2.5 MG PO TABS: ORAL_TABLET | ORAL | 0 refills | Status: DC

## 2018-02-02 MED ORDER — MEDROXYPROGESTERONE ACETATE 10 MG PO TABS: 10.0000 mg | ORAL_TABLET | Freq: Every day | ORAL | 2 refills | Status: DC

## 2018-02-02 NOTE — Progress Notes (Signed)
Pt had menstrual period 12/11-12/17 states it was heavier and longer than usual. Went a few days without BP Med's but states she took it today.

## 2018-02-02 NOTE — Progress Notes (Signed)
Subjective:    Casey Mcmahon is a 32 y.o. married P2 (5416 and 34 yo kids) female who presents for an annual exam. Her period has been irregular. She has skipped 7 periods during the past year. She has unwanted hair growth. She wants a pregnancy, is on lisinopril. She will see her primary care doctor about changing this to a pregnancy-friendly BP med.  The patient is sexually active. GYN screening history: last pap: was normal. The patient wears seatbelts: yes. The patient participates in regular exercise: no. Has the patient ever been transfused or tattooed?: yes. The patient reports that there is not domestic violence in her life.   Menstrual History: OB History    Gravida  3   Para  2   Term  2   Preterm      AB  1   Living  2     SAB      TAB  1   Ectopic      Multiple      Live Births  2           Menarche age: [age] Patient's last menstrual period was 01/18/2018 (exact date).    The following portions of the patient's history were reviewed and updated as appropriate: allergies, current medications, past family history, past medical history, past social history, past surgical history and problem list.  Review of Systems Pertinent items are noted in HPI.   Works for [employer], as a Psychologist, educationaltrainer FH- no breast/gyn/colon cancer Has been having unprotected IC since about a year when she had IUD #2 removed   Objective:    BP (!) 138/104   Pulse 89   Wt 245 lb (111.1 kg)   LMP 01/18/2018 (Exact Date)   BMI 37.25 kg/m   General Appearance:    Alert, cooperative, no distress, appears stated age  Head:    Normocephalic, without obvious abnormality, atraumatic  Eyes:    PERRL, conjunctiva/corneas clear, EOM's intact, fundi    benign, both eyes  Ears:    Normal TM's and external ear canals, both ears  Nose:   Nares normal, septum midline, mucosa normal, no drainage    or sinus tenderness  Throat:   Lips, mucosa, and tongue normal; teeth and gums normal  Neck:    Supple, symmetrical, trachea midline, no adenopathy;    thyroid:  no enlargement/tenderness/nodules; no carotid   bruit or JVD  Back:     Symmetric, no curvature, ROM normal, no CVA tenderness  Lungs:     Clear to auscultation bilaterally, respirations unlabored  Chest Wall:    No tenderness or deformity   Heart:    Regular rate and rhythm, S1 and S2 normal, no murmur, rub   or gallop  Breast Exam:    No tenderness, masses, or nipple abnormality  Abdomen:     Soft, non-tender, bowel sounds active all four quadrants,    no masses, no organomegaly  Genitalia:    Normal female without lesion, discharge or tenderness, normal size and shape, anteverted, mobile, non-tender, normal adnexal exam      Extremities:   Extremities normal, atraumatic, no cyanosis or edema  Pulses:   2+ and symmetric all extremities  Skin:   Skin color, texture, turgor normal, no rashes or lesions  Lymph nodes:   Cervical, supraclavicular, and axillary nodes normal  Neurologic:   CNII-XII intact, normal strength, sensation and reflexes    throughout  .    Assessment:  Healthy female exam.   Probable PCOS Desire for pregnancy HTN   Plan:     Thin prep Pap smear. with cotesting Change BP meds to something else Then trial of provera/femara  If no pregnancy within 3 cycles, rec referral to RE Fasting labs today Rec healthy lifestyle choices

## 2018-02-03 ENCOUNTER — Telehealth: Payer: Self-pay | Admitting: *Deleted

## 2018-02-03 LAB — COMPREHENSIVE METABOLIC PANEL
ALK PHOS: 62 IU/L (ref 39–117)
ALT: 27 IU/L (ref 0–32)
AST: 19 IU/L (ref 0–40)
Albumin/Globulin Ratio: 1.7 (ref 1.2–2.2)
Albumin: 4.3 g/dL (ref 3.5–5.5)
BILIRUBIN TOTAL: 0.3 mg/dL (ref 0.0–1.2)
BUN/Creatinine Ratio: 11 (ref 9–23)
BUN: 10 mg/dL (ref 6–20)
CHLORIDE: 101 mmol/L (ref 96–106)
CO2: 23 mmol/L (ref 20–29)
Calcium: 9.7 mg/dL (ref 8.7–10.2)
Creatinine, Ser: 0.87 mg/dL (ref 0.57–1.00)
GFR calc Af Amer: 101 mL/min/{1.73_m2} (ref >59)
GFR calc non Af Amer: 87 mL/min/{1.73_m2} (ref >59)
GLUCOSE: 111 mg/dL — AB (ref 65–99)
Globulin, Total: 2.6 g/dL (ref 1.5–4.5)
Potassium: 4.1 mmol/L (ref 3.5–5.2)
Sodium: 138 mmol/L (ref 134–144)
TOTAL PROTEIN: 6.9 g/dL (ref 6.0–8.5)

## 2018-02-03 LAB — TSH: TSH: 0.994 u[IU]/mL (ref 0.450–4.500)

## 2018-02-03 LAB — VITAMIN D 25 HYDROXY (VIT D DEFICIENCY, FRACTURES): Vit D, 25-Hydroxy: 31.6 ng/mL (ref 30.0–100.0)

## 2018-02-03 LAB — LIPID PANEL
CHOLESTEROL TOTAL: 255 mg/dL — AB (ref 100–199)
Chol/HDL Ratio: 5 ratio — ABNORMAL HIGH (ref 0.0–4.4)
HDL: 51 mg/dL (ref >39)
LDL Calculated: 186 mg/dL — ABNORMAL HIGH (ref 0–99)
Triglycerides: 92 mg/dL (ref 0–149)
VLDL CHOLESTEROL CAL: 18 mg/dL (ref 5–40)

## 2018-02-03 LAB — CBC
Hematocrit: 36.6 % (ref 34.0–46.6)
Hemoglobin: 13.2 g/dL (ref 11.1–15.9)
MCH: 31.7 pg (ref 26.6–33.0)
MCHC: 36.1 g/dL — AB (ref 31.5–35.7)
MCV: 88 fL (ref 79–97)
PLATELETS: 305 10*3/uL (ref 150–450)
RBC: 4.16 x10E6/uL (ref 3.77–5.28)
RDW: 12.4 % (ref 12.3–15.4)
WBC: 6.6 10*3/uL (ref 3.4–10.8)

## 2018-02-03 LAB — HEMOGLOBIN A1C
Est. average glucose Bld gHb Est-mCnc: 126 mg/dL
Hgb A1c MFr Bld: 6 % — ABNORMAL HIGH (ref 4.8–5.6)

## 2018-02-03 NOTE — Telephone Encounter (Signed)
-----   Message from Allie BossierMyra C Dove, MD sent at 02/03/2018  8:40 AM EST ----- She needs to see her primary care provider due to abnormal lipid panel and suspicion of DM. Thanks

## 2018-02-03 NOTE — Telephone Encounter (Signed)
Called pt to inform her of abnormal lipid panel and suspicion of DM.  Advised pt to follow up with her PCP.  Pt verbalized understanding and reports that she has a PCP already.

## 2018-02-06 LAB — CYTOLOGY - PAP
BACTERIAL VAGINITIS: POSITIVE — AB
CANDIDA VAGINITIS: NEGATIVE
DIAGNOSIS: NEGATIVE
HPV: NOT DETECTED

## 2018-02-08 NOTE — L&D Delivery Note (Addendum)
Patient: Casey Mcmahon MRN: [id number]  GBS status: Neg, IAP given NA   Patient is a 33 y.o. now [id number] s/p NSVD at [redacted]w[redacted]d, who was admitted for IOL. AROM 3h 85m prior to delivery with normal fluid.    Delivery Note At 1:58 PM a viable female was delivered via Vaginal, Spontaneous (Presentation: ;  ROA ).  APGAR: 8, 9; weight Pending .   Placenta status: spontaneous, intact.  Cord: 3 vessel  with the following complications: None .   Anesthesia:   Episiotomy: None Lacerations:   Suture Repair: NA  Est. Blood Loss (mL):  250  Mom to postpartum.  Baby to Couplet care / Skin to Skin.  Gifford Shave 11/16/2018, 2:51 PM  Head delivered ROA. No nuchal cord present. Shoulder and body delivered in usual fashion. Infant with spontaneous cry, placed on mother's abdomen, dried and bulb suctioned. Cord clamped x 2 after 1-minute delay, and cut by family member. Cord blood drawn. Placenta delivered spontaneously with gentle cord traction. Fundus firm with massage and Pitocin. Perineum inspected and found to have no lacerations with minor abrasions to periurethral area not requiring sutures.  I was present for the entire delivery of baby and placenta and inspection of perineum and agree with above.  Please schedule this patient for Postpartum visit in: 6 weeks with the following provider: Any provider. In-person visit for GTT.  For C/S patients schedule nurse incision check in weeks 2 weeks: NA High risk pregnancy complicated by: [id number], CHTN Delivery mode:  SVD Anticipated Birth Control:  BTL done PP PP Procedures needed: 2 hour GTT, Virtual BP check ~11/21/18 Schedule Integrated [place] visit: no     Tamala Julian, [place], [place] 11/16/2018 4:15 PM

## 2018-02-10 ENCOUNTER — Telehealth: Payer: Self-pay | Admitting: General Practice

## 2018-02-10 DIAGNOSIS — I10 Essential (primary) hypertension: Secondary | ICD-10-CM

## 2018-02-10 MED ORDER — LABETALOL HCL 200 MG PO TABS: 400.0000 mg | ORAL_TABLET | Freq: Two times a day (BID) | ORAL | 2 refills | Status: DC

## 2018-02-10 NOTE — Telephone Encounter (Signed)
Patient called and left message on nurse voicemail line stating she is patient of Dr Ellin Saba and recently went to her PCP to get her blood pressure medication changed for something that would be safe for when she got pregnant. Patient states her PCP told her they didn't know what medication would be safe and told her to return to [place] for a change in medication. Spoke with Dr Marice Potter who was agreeable to writing Rx for patient- ordered labetalol 400mg  BID. Patient should follow up in a week for BP check. Called & discussed with patient and told her to discontinue current BP med. Patient verbalized understanding and states she is about to go out of town for 2 weeks but can come in after the 17th. Offered appt 1/20 @ 920. Patient verbalized understanding & had no questions.

## 2018-02-27 ENCOUNTER — Ambulatory Visit (INDEPENDENT_AMBULATORY_CARE_PROVIDER_SITE_OTHER): Payer: Self-pay | Admitting: *Deleted

## 2018-02-27 VITALS — BP 110/72 | Wt 244.1 lb

## 2018-02-27 DIAGNOSIS — Z0130 Encounter for examination of blood pressure without abnormal findings: Secondary | ICD-10-CM

## 2018-02-27 NOTE — Progress Notes (Addendum)
Here for blood pressure check. Wanting to start bp med ok for when she is pregnant. Was started on labetolol 400 bid 02/10/18 by Dr.Dove.  BP today 110/72.. She states it makes her drowsy and wants to know if it can be changed to a medicine that won't make her drowsy. We discussed there are limited medicines for pregnancy- and we understand she is trying to get pregnant. I informed her I will forward to Dr. Marice Potter to see if she wants to change medicine.  Amneet Cendejas,RN

## 2018-03-03 ENCOUNTER — Telehealth: Payer: Self-pay | Admitting: *Deleted

## 2018-03-03 NOTE — Telephone Encounter (Signed)
-----   Message from Allie Bossier, MD sent at 03/01/2018  8:18 AM EST ----- Lets try her on labetalol but only 200 mg BID. Come in for BP check in 2 weeks. ----- Message ----- From: Gerome Apley, RN Sent: 02/27/2018   9:39 AM EST To: Allie Bossier, MD  Dr. Marice Potter, please read note- is there another med she can take?

## 2018-03-03 NOTE — Telephone Encounter (Signed)
Per Dr.Dove message I called Casey Mcmahon to notify her may decrease labetolol to 200mg  bid and bp check in 2 weeks- I left a message I am following up from her visit earlier in the week and will send a MyChart message- call us or message [place] if questions.  LI

## 2018-03-17 ENCOUNTER — Ambulatory Visit: Payer: Self-pay

## 2018-03-30 ENCOUNTER — Ambulatory Visit (INDEPENDENT_AMBULATORY_CARE_PROVIDER_SITE_OTHER): Payer: Commercial Managed Care - PPO | Admitting: *Deleted

## 2018-03-30 VITALS — BP 127/70 | HR 77

## 2018-03-30 DIAGNOSIS — K219 Gastro-esophageal reflux disease without esophagitis: Secondary | ICD-10-CM

## 2018-03-30 DIAGNOSIS — O10919 Unspecified pre-existing hypertension complicating pregnancy, unspecified trimester: Secondary | ICD-10-CM

## 2018-03-30 DIAGNOSIS — Z3201 Encounter for pregnancy test, result positive: Secondary | ICD-10-CM

## 2018-03-30 DIAGNOSIS — Z3200 Encounter for pregnancy test, result unknown: Secondary | ICD-10-CM

## 2018-03-30 DIAGNOSIS — O0990 Supervision of high risk pregnancy, unspecified, unspecified trimester: Secondary | ICD-10-CM

## 2018-03-30 LAB — POCT PREGNANCY, URINE: PREG TEST UR: POSITIVE — AB

## 2018-03-30 MED ORDER — PRENATAL 27-1 MG PO TABS: 1.0000 | ORAL_TABLET | Freq: Every day | ORAL | 9 refills | Status: AC

## 2018-03-30 NOTE — Progress Notes (Signed)
Here for pregnancy test which was positive. States LMP 01/20/19. States was taking provera to make her have a period. She finished her only run of provera x 10 days on 03/25/18. She is sure of period date and was supposed to start provera in January if she missed a period but forgot until about 03/15/18. Discussed with Dr. Debroah Loop and informed patient we probably don't need to do [place] to confirm dates as she is sure of dates. Informed her we do recommend starting prenatal care asap and prenatal vitamins. We reviewed her medications and advised her which are ok and which are not. She voices understanding.  Linda,RN

## 2018-03-30 NOTE — Progress Notes (Signed)
Patient ID: Casey Mcmahon, female   DOB: [birth year], 33 y.o.   MRN: [id number] I have reviewed the chart and agree with nursing staff's documentation of this patient's encounter.  Scheryl Darter, MD 03/30/2018 2:27 PM

## 2018-04-17 ENCOUNTER — Encounter: Payer: Self-pay | Admitting: Family Medicine

## 2018-04-17 ENCOUNTER — Ambulatory Visit (INDEPENDENT_AMBULATORY_CARE_PROVIDER_SITE_OTHER): Payer: No Typology Code available for payment source | Admitting: Family Medicine

## 2018-04-17 ENCOUNTER — Ambulatory Visit: Payer: Self-pay

## 2018-04-17 VITALS — BP 137/86 | HR 73 | Wt 244.8 lb

## 2018-04-17 DIAGNOSIS — G43909 Migraine, unspecified, not intractable, without status migrainosus: Secondary | ICD-10-CM | POA: Insufficient documentation

## 2018-04-17 DIAGNOSIS — I10 Essential (primary) hypertension: Secondary | ICD-10-CM

## 2018-04-17 DIAGNOSIS — K219 Gastro-esophageal reflux disease without esophagitis: Secondary | ICD-10-CM

## 2018-04-17 DIAGNOSIS — O09521 Supervision of elderly multigravida, first trimester: Secondary | ICD-10-CM

## 2018-04-17 DIAGNOSIS — O3680X0 Pregnancy with inconclusive fetal viability, not applicable or unspecified: Secondary | ICD-10-CM

## 2018-04-17 DIAGNOSIS — Z3490 Encounter for supervision of normal pregnancy, unspecified, unspecified trimester: Secondary | ICD-10-CM

## 2018-04-17 DIAGNOSIS — O0991 Supervision of high risk pregnancy, unspecified, first trimester: Secondary | ICD-10-CM

## 2018-04-17 DIAGNOSIS — R7303 Prediabetes: Secondary | ICD-10-CM

## 2018-04-17 DIAGNOSIS — O09529 Supervision of elderly multigravida, unspecified trimester: Secondary | ICD-10-CM | POA: Insufficient documentation

## 2018-04-17 DIAGNOSIS — O0990 Supervision of high risk pregnancy, unspecified, unspecified trimester: Secondary | ICD-10-CM | POA: Insufficient documentation

## 2018-04-17 DIAGNOSIS — J4520 Mild intermittent asthma, uncomplicated: Secondary | ICD-10-CM | POA: Insufficient documentation

## 2018-04-17 DIAGNOSIS — Z3A08 8 weeks gestation of pregnancy: Secondary | ICD-10-CM

## 2018-04-17 DIAGNOSIS — O169 Unspecified maternal hypertension, unspecified trimester: Secondary | ICD-10-CM | POA: Insufficient documentation

## 2018-04-17 DIAGNOSIS — E282 Polycystic ovarian syndrome: Secondary | ICD-10-CM | POA: Insufficient documentation

## 2018-04-17 DIAGNOSIS — Z3480 Encounter for supervision of other normal pregnancy, unspecified trimester: Secondary | ICD-10-CM

## 2018-04-17 DIAGNOSIS — O10011 Pre-existing essential hypertension complicating pregnancy, first trimester: Secondary | ICD-10-CM

## 2018-04-17 DIAGNOSIS — O10019 Pre-existing essential hypertension complicating pregnancy, unspecified trimester: Secondary | ICD-10-CM

## 2018-04-17 DIAGNOSIS — J4540 Moderate persistent asthma, uncomplicated: Secondary | ICD-10-CM

## 2018-04-17 DIAGNOSIS — Z113 Encounter for screening for infections with a predominantly sexual mode of transmission: Secondary | ICD-10-CM | POA: Diagnosis not present

## 2018-04-17 HISTORY — DX: Encounter for supervision of other normal pregnancy, unspecified trimester: Z34.80

## 2018-04-17 HISTORY — DX: Moderate persistent asthma, uncomplicated: J45.40

## 2018-04-17 MED ORDER — ASPIRIN EC 81 MG PO TBEC: 81.0000 mg | DELAYED_RELEASE_TABLET | Freq: Every day | ORAL | 3 refills | Status: DC

## 2018-04-17 MED ORDER — ALBUTEROL SULFATE HFA 108 (90 BASE) MCG/ACT IN AERS: 2.0000 | INHALATION_SPRAY | Freq: Four times a day (QID) | RESPIRATORY_TRACT | 2 refills | Status: DC | PRN

## 2018-04-17 MED ORDER — LABETALOL HCL 200 MG PO TABS: 400.0000 mg | ORAL_TABLET | Freq: Every day | ORAL | 2 refills | Status: DC

## 2018-04-17 NOTE — Progress Notes (Signed)
Subjective:   Casey Mcmahon is a 33 y.o. [id number] at [redacted]w[redacted]d by early u/s being seen today for her first obstetrical visit.  Her obstetrical history is significant for advanced maternal age and hypertension. Pregnancy history fully reviewed.  Patient reports fatigue.  HISTORY: OB History  Gravida Para Term Preterm AB Living  0 1 2  SAB TAB Ectopic Multiple Live Births  0 1 0 0 2    # Outcome Date GA Lbr Len/2nd Weight Sex Delivery Anes PTL Lv  4 Current           3 Term 06/29/05 [redacted]w[redacted]d  6 lb 8 oz (2.948 kg) F Vag-Spont   LIV  2 TAB 2005          1 Term 02/12/01 [redacted]w[redacted]d  8 lb 8 oz (3.856 kg) F Vag-Spont   LIV   Last pap smear was 01/2018 and was normal Past Medical History:  Diagnosis Date  . Asthma    prn inhaler  . Asthma, moderate persistent 04/17/2018  . Derangement of posterior horn of medial meniscus 01/2014   left  . GERD (gastroesophageal reflux disease)    no current med.  . Hypertension    under control with med., has been on med. x 3 yr.  . Left ACL tear 01/2014  . Migraines    occasional  . Seasonal allergies    current nasal congestion (01/24/2014)  . Supervision of other normal pregnancy, antepartum 04/17/2018   Past Surgical History:  Procedure Laterality Date  . KNEE ARTHROSCOPY WITH ANTERIOR CRUCIATE LIGAMENT (ACL) REPAIR Left 01/28/2014   Procedure: LEFT KNEE ARTHROSCOPY WITH MEDIAL MENISCECTOMY AND ANTERIOR CRUCIATE LIGAMENT REPAIR;  Surgeon: Loreta Ave, MD;  Location: [facility];  Service: Orthopedics;  Laterality: Left;  . ORIF ANKLE FRACTURE Left 07/19/2013   Procedure: LEFT ANKLE FRACTURE OPEN TREATMENT BIMALLEOLAR ANKLE INCLUDES INTERNAL FIXATION, LEFT ANKLE FRACTURE OPEN TREATMENT DISTAL TIBIOFIBULAR fasciotomy INCLUDES INTERNAL FIXATION repai of synosmosis;  Surgeon: Loreta Ave, MD;  Location: [facility];  Service: Orthopedics;  Laterality: Left;   Family History  Problem Relation Age of Onset  .  Diabetes Maternal Grandfather   . Diabetes Mother   . Alcoholism Mother   . Diabetes Father   . Hypertension Father   . Epilepsy Daughter   . Alzheimer's disease Maternal Grandmother   . Glaucoma Maternal Grandmother    Social History   Tobacco Use  . Smoking status: Never Smoker  . Smokeless tobacco: Never Used  Substance Use Topics  . Alcohol use: Not Currently    Comment: socially  . Drug use: No   No Known Allergies Current Outpatient Medications on File Prior to Visit  Medication Sig Dispense Refill  . acetaminophen (TYLENOL) 500 MG tablet Take 1,000 mg by mouth every 6 (six) hours as needed for mild pain.    . fluticasone (FLONASE) 50 MCG/ACT nasal spray Place 1 spray into both nostrils daily.     Marland Kitchen PRENATAL 27-1 MG TABS Take 1 tablet by mouth daily. 30 each 9  . sodium chloride (OCEAN) 0.65 % SOLN nasal spray Place 1 spray into both nostrils as needed for congestion.    . cyclobenzaprine (FLEXERIL) 5 MG tablet Take 5-10 mg by mouth 3 (three) times daily as needed for muscle spasms.     . DiphenhydrAMINE HCl 50 MG/30ML LIQD Take 30 mLs by mouth 2 (two) times daily as needed. Cough and cold symptoms    .  fluticasone furoate-vilanterol (BREO ELLIPTA) 100-25 MCG/INH AEPB Inhale 1 puff into the lungs as needed.    . medroxyPROGESTERone (PROVERA) 10 MG tablet Take 1 tablet (10 mg total) by mouth daily. Use for ten days (Patient not taking: Reported on 03/30/2018) 10 tablet 2  . meloxicam (MOBIC) 15 MG tablet Take 15 mg by mouth daily.    Marland Kitchen omeprazole (PRILOSEC) 10 MG capsule Take 10 mg by mouth daily.     No current facility-administered medications on file prior to visit.      Exam   Vitals:   04/17/18 0857  BP: 137/86  Pulse: 73  Weight: 244 lb 12.8 oz (111 kg)   Fetal Heart Rate (bpm): [place] - 170  Uterus:     Pelvic Exam: Perineum: no hemorrhoids, normal perineum   Vulva: normal external genitalia, no lesions   Vagina:  normal mucosa, normal discharge   Cervix: no  lesions and normal, pap smear done.    Adnexa: normal adnexa and no mass, fullness, tenderness   Bony Pelvis: average  System: General: well-developed, well-nourished female in no acute distress   Breast:  normal appearance, no masses or tenderness   Skin: normal coloration and turgor, no rashes   Neurologic: oriented, normal, negative, normal mood   Extremities: normal strength, tone, and muscle mass, ROM of all joints is normal   HEENT PERRLA, extraocular movement intact and sclera clear, anicteric   Mouth/Teeth mucous membranes moist, pharynx normal without lesions and dental hygiene good   Neck supple and no masses   Cardiovascular: regular rate and rhythm   Respiratory:  no respiratory distress, normal breath sounds   Abdomen: soft, non-tender; bowel sounds normal; no masses,  no organomegaly    U/s reveals IUP at 8 wk, + flicker Assessment:   Pregnancy: [id number] Patient Active Problem List   Diagnosis Date Noted  . PCOS (polycystic ovarian syndrome) 04/17/2018  . Supervision of high risk pregnancy, antepartum 04/17/2018  . Hypertension in pregnancy, antepartum 04/17/2018  . GERD (gastroesophageal reflux disease) 04/17/2018  . Asthma, mild intermittent 04/17/2018  . Prediabetes 04/17/2018  . AMA (advanced maternal age) multigravida 35+ 04/17/2018  . Migraines   . Obesity 02/02/2018  . High blood pressure 02/02/2018     Plan:  1. PCOS (polycystic ovarian syndrome)   2. Supervision of high risk pregnancy, antepartum   - Obstetric Panel, Including HIV - Culture, OB Urine - Cervicovaginal ancillary only( [facility]) - [place] MFM OB DETAIL +14 WK; Future  3. Pre-existing essential hypertension during pregnancy, antepartum Baseline labs ASA at 12 wks Serial u/s for growth - Protein / creatinine ratio, urine - Comprehensive metabolic panel  4. Gastroesophageal reflux disease without esophagitis  5. Mild intermittent asthma without complication - albuterol (PROVENTIL  HFA;VENTOLIN HFA) 108 (90 Base) MCG/ACT inhaler; Inhale 2 puffs into the lungs every 6 (six) hours as needed for wheezing or shortness of breath.  Dispense: 1 Inhaler; Refill: 2  6. Prediabetes Advised low carb diet, early 2 hour at 24-26 wks  7. Essential hypertension BP is ok on once daily dosing, may check at home to see if this is enough meds. - labetalol (NORMODYNE) 200 MG tablet; Take 2 tablets (400 mg total) by mouth daily for 30 days.  Dispense: 120 tablet; Refill: 2  8. Multigravida of advanced maternal age in first trimester For NIPT once she is 10 wks  9. Encounter to determine fetal viability of pregnancy, single or unspecified fetus Dating confirmed - US OB Limited; Future  10. Pregnancy with uncertain dates, antepartum - US OB Limited; Future   Initial labs drawn. Continue prenatal vitamins. Genetic Screening discussed, NIPS: discussed, too early. Ultrasound discussed; fetal anatomic survey: requested. Problem list reviewed and updated.  Routine obstetric precautions reviewed. Return in 4 weeks (on 05/15/2018).

## 2018-04-17 NOTE — Patient Instructions (Signed)

## 2018-04-17 NOTE — Progress Notes (Signed)
Pt informed that the ultrasound is considered a limited OB ultrasound and is not intended to be a complete ultrasound exam.  Patient also informed that the ultrasound is not being completed with the intent of assessing for fetal or placental anomalies or any pelvic abnormalities.  Explained that the purpose of today's ultrasound is to assess for viability and estimated gestational age.  Patient acknowledges the purpose of the exam and the limitations of the study.    

## 2018-04-18 LAB — OBSTETRIC PANEL, INCLUDING HIV
Antibody Screen: NEGATIVE
Basophils Absolute: 0 10*3/uL (ref 0.0–0.2)
Basos: 0 %
EOS (ABSOLUTE): 0 10*3/uL (ref 0.0–0.4)
EOS: 0 %
HEMOGLOBIN: 12.5 g/dL (ref 11.1–15.9)
HEP B S AG: NEGATIVE
HIV Screen 4th Generation wRfx: NONREACTIVE
Hematocrit: 37.3 % (ref 34.0–46.6)
IMMATURE GRANS (ABS): 0 10*3/uL (ref 0.0–0.1)
Immature Granulocytes: 0 %
LYMPHS: 26 %
Lymphocytes Absolute: 2 10*3/uL (ref 0.7–3.1)
MCH: 30.5 pg (ref 26.6–33.0)
MCHC: 33.5 g/dL (ref 31.5–35.7)
MCV: 91 fL (ref 79–97)
MONOCYTES: 5 %
MONOS ABS: 0.4 10*3/uL (ref 0.1–0.9)
NEUTROS PCT: 69 %
Neutrophils Absolute: 5.2 10*3/uL (ref 1.4–7.0)
Platelets: 281 10*3/uL (ref 150–450)
RBC: 4.1 x10E6/uL (ref 3.77–5.28)
RDW: 12.3 % (ref 11.7–15.4)
RH TYPE: POSITIVE
RPR: NONREACTIVE
RUBELLA: 5.11 {index} (ref >0.99)
WBC: 7.6 10*3/uL (ref 3.4–10.8)

## 2018-04-18 LAB — COMPREHENSIVE METABOLIC PANEL
ALK PHOS: 52 IU/L (ref 39–117)
ALT: 19 IU/L (ref 0–32)
AST: 18 IU/L (ref 0–40)
Albumin/Globulin Ratio: 1.5 (ref 1.2–2.2)
Albumin: 4.1 g/dL (ref 3.8–4.8)
BILIRUBIN TOTAL: 0.4 mg/dL (ref 0.0–1.2)
BUN/Creatinine Ratio: 9 (ref 9–23)
BUN: 7 mg/dL (ref 6–20)
CHLORIDE: 103 mmol/L (ref 96–106)
CO2: 21 mmol/L (ref 20–29)
Calcium: 9.9 mg/dL (ref 8.7–10.2)
Creatinine, Ser: 0.81 mg/dL (ref 0.57–1.00)
GFR calc Af Amer: 110 mL/min/{1.73_m2} (ref >59)
GFR calc non Af Amer: 95 mL/min/{1.73_m2} (ref >59)
Globulin, Total: 2.8 g/dL (ref 1.5–4.5)
Glucose: 101 mg/dL — ABNORMAL HIGH (ref 65–99)
Potassium: 4.3 mmol/L (ref 3.5–5.2)
Sodium: 138 mmol/L (ref 134–144)
Total Protein: 6.9 g/dL (ref 6.0–8.5)

## 2018-04-18 LAB — PROTEIN / CREATININE RATIO, URINE
CREATININE, UR: 210.9 mg/dL
PROTEIN UR: 10.5 mg/dL
PROTEIN/CREAT RATIO: 50 mg/g{creat} (ref 0–200)

## 2018-04-18 LAB — CERVICOVAGINAL ANCILLARY ONLY
Chlamydia: NEGATIVE
Neisseria Gonorrhea: NEGATIVE

## 2018-04-19 LAB — CULTURE, OB URINE

## 2018-04-19 LAB — URINE CULTURE, OB REFLEX

## 2018-05-11 ENCOUNTER — Telehealth: Payer: Self-pay | Admitting: Family Medicine

## 2018-05-11 NOTE — Telephone Encounter (Signed)
Called in about her scheduled appointment. Informed the patient the appointment was cancelled due the close of our clinic.

## 2018-05-18 ENCOUNTER — Encounter: Payer: Commercial Managed Care - PPO | Admitting: Obstetrics & Gynecology

## 2018-05-23 ENCOUNTER — Telehealth: Payer: Self-pay | Admitting: Obstetrics & Gynecology

## 2018-05-23 NOTE — Telephone Encounter (Signed)
Called in upset about her rescheduled appointment. Explained to the patient the COVID19 restrictions and scheduling. Stated she does not have access to cuffs however she will come to our office to get some at her earliest convience. She is currently experiencing car issues. No questions at this time.

## 2018-05-25 ENCOUNTER — Telehealth: Payer: Self-pay | Admitting: Obstetrics and Gynecology

## 2018-05-25 NOTE — Telephone Encounter (Signed)
Called the patient to inform of upcoming appointment in addition to sending a mychart message. The patient stated her appointments are always getting cancelled and no one ever calls her. She always has to call to receive information and she can never talk to any other than the after hours operator.

## 2018-05-25 NOTE — Telephone Encounter (Signed)
Informed the patient we always call when changes are made in addition to sending a mychart message when applicable. Also notate the calls made to the patient. She stated she was told we were open until 5pm on Fridays. Ensure the patient the office hours have been as is since the start of her preganacy and educated of the COVID19 restrictions. Stated she may change providers and she would like to speak with an Print production planner. Informed of hour of operation tomorrow and informed the patient I would deliever a message to the nurse line with the concern of the testing of abnormalities of the baby.

## 2018-05-26 ENCOUNTER — Encounter: Payer: Commercial Managed Care - PPO | Admitting: Obstetrics & Gynecology

## 2018-05-26 ENCOUNTER — Telehealth: Payer: Self-pay | Admitting: *Deleted

## 2018-05-26 ENCOUNTER — Other Ambulatory Visit: Payer: Self-pay | Admitting: *Deleted

## 2018-05-26 DIAGNOSIS — O0990 Supervision of high risk pregnancy, unspecified, unspecified trimester: Secondary | ICD-10-CM

## 2018-05-26 DIAGNOSIS — O10019 Pre-existing essential hypertension complicating pregnancy, unspecified trimester: Secondary | ICD-10-CM

## 2018-05-26 NOTE — Telephone Encounter (Signed)
I called [place] to discuss her concerns / questions. She c/o numerous times of her appointment being changed, office closed, not being notified, etc.  I apologized for the many changes due to covid 19 and our office moved.   She states she has considered moving to another ob office. I informed her that is her choice but if she chooses to stay we will try to meet her needs. We discussed she is concerned does not have a in person visit scheduled and we cannot check fhr virtually. I explained some of her visits will be in person and some virtually. I also offered to set up on babyscripts app so she can get a bp kit sent to her home and check her bp weekly.  She agreed to that and I put in order and talked her thru downloading the app and ordering bp kit.  She also is concerned with getting genetic test- I offered her a lab appt for genetic testing.  She agreed to Monday 10:00. I also offered to send message to Dr. Kennon Rounds re: if she needs another visit before her virtual visit which she agreed to.  She also c/o mild cramping but denies bleeding or severe pain- we discussed this is usually normal and part of pregnancy changes.  She voices understanding.

## 2018-05-26 NOTE — Telephone Encounter (Signed)
Received message from staff to call patient as she is unhappy with her care.  Patient states she has had very little communication regarding why her appointments are cancelled and she can't seem to get satisfactory answers to her concerns/questions.  Explained to patient about our office move and the new location.  Explained the mandate to keep patient face to face visits to a minimum.  Explained rationale of using all resources to keep her safe and provide excellent care.  Explained babyscripts and blood pressure cuffs, etc.  Explained again why virtual visits are important.  Explained the reasons to go to the hospital or to have a face to face visit.    Patient states understanding and feels better after the explanations I have given.

## 2018-05-29 ENCOUNTER — Other Ambulatory Visit: Payer: No Typology Code available for payment source

## 2018-05-30 ENCOUNTER — Encounter: Payer: Self-pay | Admitting: General Practice

## 2018-06-01 ENCOUNTER — Ambulatory Visit (HOSPITAL_COMMUNITY): Payer: No Typology Code available for payment source

## 2018-06-01 ENCOUNTER — Other Ambulatory Visit: Payer: No Typology Code available for payment source

## 2018-06-01 ENCOUNTER — Ambulatory Visit (HOSPITAL_COMMUNITY)
Admission: RE | Admit: 2018-06-01 | Discharge: 2018-06-01 | Disposition: A | Discharge status: Home / Self Care | Payer: No Typology Code available for payment source | Source: Ambulatory Visit | Attending: Obstetrics and Gynecology | Admitting: Obstetrics and Gynecology

## 2018-06-01 ENCOUNTER — Ambulatory Visit (INDEPENDENT_AMBULATORY_CARE_PROVIDER_SITE_OTHER): Payer: No Typology Code available for payment source | Admitting: Family Medicine

## 2018-06-01 ENCOUNTER — Other Ambulatory Visit: Payer: Self-pay | Admitting: General Practice

## 2018-06-01 ENCOUNTER — Other Ambulatory Visit: Payer: Self-pay

## 2018-06-01 VITALS — BP 132/85 | HR 98 | Wt 231.0 lb

## 2018-06-01 DIAGNOSIS — O219 Vomiting of pregnancy, unspecified: Secondary | ICD-10-CM

## 2018-06-01 DIAGNOSIS — O0990 Supervision of high risk pregnancy, unspecified, unspecified trimester: Secondary | ICD-10-CM

## 2018-06-01 DIAGNOSIS — Z3A14 14 weeks gestation of pregnancy: Secondary | ICD-10-CM

## 2018-06-01 DIAGNOSIS — O09521 Supervision of elderly multigravida, first trimester: Secondary | ICD-10-CM

## 2018-06-01 DIAGNOSIS — O10019 Pre-existing essential hypertension complicating pregnancy, unspecified trimester: Secondary | ICD-10-CM

## 2018-06-01 MED ORDER — DOXYLAMINE-PYRIDOXINE 10-10 MG PO TBEC: 1.0000 | DELAYED_RELEASE_TABLET | Freq: Four times a day (QID) | ORAL | 2 refills | Status: DC

## 2018-06-01 NOTE — Progress Notes (Unsigned)
FHR 152 by doppler. BP 132/85 HR 98  Pt taking labetalol 2 200mg  PO Labetalol BID  Pt reports she is struggling with nausea and has been trying gingerale at home but it is not helping.  Pt requests that she be given medication to help manage.

## 2018-06-01 NOTE — Progress Notes (Signed)
FHR 152 by doppler. BP 132/85 HR 98  Pt taking labetalol 2 200mg  tablets PO Labetalol BID  Pt reports she is struggling with nausea and has been trying gingerale at home but it is not helping.  Pt requests that she be given medication to help man age.

## 2018-06-02 NOTE — Patient Instructions (Signed)

## 2018-06-02 NOTE — Progress Notes (Signed)
   PRENATAL VISIT NOTE  Subjective:  Casey Mcmahon is a 33 y.o. [id number] at [redacted]w[redacted]d being seen today for ongoing prenatal care.  She is currently monitored for the following issues for this high-risk pregnancy and has Obesity; High blood pressure; PCOS (polycystic ovarian syndrome); Supervision of high risk pregnancy, antepartum; Hypertension in pregnancy, antepartum; GERD (gastroesophageal reflux disease); Asthma, mild intermittent; Prediabetes; Migraines; and AMA (advanced maternal age) multigravida 35+ on their problem list.  Patient reports nausea.  Contractions: Irritability. Vag. Bleeding: None.  Movement: Absent. Denies leaking of fluid.   The following portions of the patient's history were reviewed and updated as appropriate: allergies, current medications, past family history, past medical history, past social history, past surgical history and problem list.   Objective:   Vitals:   06/01/18 1012  BP: 132/85  Pulse: 98  Weight: 231 lb (104.8 kg)    Fetal Status: Fetal Heart Rate (bpm): 152   Movement: Absent     General:  Alert, oriented and cooperative. Patient is in no acute distress.  Skin: Skin is warm and dry. No rash noted.   Cardiovascular: Normal heart rate noted  Respiratory: Normal respiratory effort, no problems with respiration noted  Abdomen: Soft, gravid, appropriate for gestational age.  Pain/Pressure: Absent     Pelvic: Cervical exam deferred        Extremities: Normal range of motion.  Edema: None  Mental Status: Normal mood and affect. Normal behavior. Normal judgment and thought content.   Assessment and Plan:  Pregnancy: [id number] at [redacted]w[redacted]d 1. Nausea and vomiting in pregnancy Trial of Diclegis - Doxylamine-Pyridoxine 10-10 MG TBEC; Take 1 tablet by mouth 4 (four) times daily.  Dispense: 100 tablet; Refill: 2  2. Pre-existing essential hypertension during pregnancy, antepartum Continue Labetalol and ASA Nml baseline labs Advised of new guidelines if BP  remains ok due to COVID risks  3. Supervision of high risk pregnancy, antepartum Anatomy u/s is scheduled mid-May  4. Multigravida of advanced maternal age in first trimester NIPT testing today  Preterm labor symptoms and general obstetric precautions including but not limited to vaginal bleeding, contractions, leaking of fluid and fetal movement were reviewed in detail with the patient. Please refer to After Visit Summary for other counseling recommendations.   No follow-ups on file.  Future Appointments  Date Time Provider Department Center  06/23/2018  8:15 AM Hermina Staggers, MD [facility]  06/30/2018  7:30 AM [facility] NURSE [facility]  06/30/2018  7:45 AM [facility] [place] 2 [facility]-US    Reva Bores, MD

## 2018-06-06 ENCOUNTER — Other Ambulatory Visit: Payer: Self-pay

## 2018-06-06 DIAGNOSIS — O219 Vomiting of pregnancy, unspecified: Secondary | ICD-10-CM

## 2018-06-06 MED ORDER — PROMETHAZINE HCL 12.5 MG PO TABS: 12.5000 mg | ORAL_TABLET | Freq: Four times a day (QID) | ORAL | 0 refills | Status: AC | PRN

## 2018-06-06 NOTE — Progress Notes (Signed)
  Pt was denied for Doxylamine-Pyridoxine 10-10 MG per Dr.Anyanwu pt can take Phenergan or Reglan. Will call patient and advise sent phenergan to pharmacy.

## 2018-06-07 NOTE — Telephone Encounter (Signed)
Opened in error

## 2018-06-19 ENCOUNTER — Telehealth (INDEPENDENT_AMBULATORY_CARE_PROVIDER_SITE_OTHER): Payer: No Typology Code available for payment source | Admitting: *Deleted

## 2018-06-19 DIAGNOSIS — O0990 Supervision of high risk pregnancy, unspecified, unspecified trimester: Secondary | ICD-10-CM

## 2018-06-19 NOTE — Telephone Encounter (Signed)
Called pt, per her request, regarding her Ottis Stain results and how to find them in her mychart.  Pt unable to find.  Recommended that she call Avelina Laine since she was having trouble with their site also.  Pt verbalized understanding and expressed thanks.

## 2018-06-22 ENCOUNTER — Telehealth: Payer: Self-pay | Admitting: Obstetrics and Gynecology

## 2018-06-22 NOTE — Telephone Encounter (Signed)
Called the patient to inform of the upcoming visit. The patient verbalized understanding and also downloaded the app during the call. °

## 2018-06-23 ENCOUNTER — Telehealth (INDEPENDENT_AMBULATORY_CARE_PROVIDER_SITE_OTHER): Payer: No Typology Code available for payment source | Admitting: Obstetrics and Gynecology

## 2018-06-23 ENCOUNTER — Encounter: Payer: Self-pay | Admitting: Obstetrics and Gynecology

## 2018-06-23 ENCOUNTER — Other Ambulatory Visit: Payer: Self-pay

## 2018-06-23 DIAGNOSIS — O10012 Pre-existing essential hypertension complicating pregnancy, second trimester: Secondary | ICD-10-CM

## 2018-06-23 DIAGNOSIS — O0992 Supervision of high risk pregnancy, unspecified, second trimester: Secondary | ICD-10-CM

## 2018-06-23 DIAGNOSIS — O0990 Supervision of high risk pregnancy, unspecified, unspecified trimester: Secondary | ICD-10-CM

## 2018-06-23 DIAGNOSIS — O10019 Pre-existing essential hypertension complicating pregnancy, unspecified trimester: Secondary | ICD-10-CM

## 2018-06-23 DIAGNOSIS — Z3A17 17 weeks gestation of pregnancy: Secondary | ICD-10-CM

## 2018-06-23 NOTE — Progress Notes (Signed)
   TELEHEALTH VIRTUAL OBSTETRICS PRENATAL VISIT ENCOUNTER NOTE  I connected with Nayelie Grabiec Afzal on 06/23/18 at  8:15 AM EDT by WebEx at home and verified that I am speaking with the correct person using two identifiers.   I discussed the limitations, risks, security and privacy concerns of performing an evaluation and management service by telephone and the availability of in person appointments. I also discussed with the patient that there may be a patient responsible charge related to this service. The patient expressed understanding and agreed to proceed. Subjective:  Casey Mcmahon is a 33 y.o. [id number] at [redacted]w[redacted]d being seen today for ongoing prenatal care.  She is currently monitored for the following issues for this high-risk pregnancy and has Obesity; High blood pressure; PCOS (polycystic ovarian syndrome); Supervision of high risk pregnancy, antepartum; Hypertension in pregnancy, antepartum; GERD (gastroesophageal reflux disease); Asthma, mild intermittent; Prediabetes; Migraines; and AMA (advanced maternal age) multigravida 35+ on their problem list.  Patient reports no complaints.  Reports fetal movement. Contractions: Not present. Vag. Bleeding: None.  Movement: Present. Denies any contractions, bleeding or leaking of fluid.   The following portions of the patient's history were reviewed and updated as appropriate: allergies, current medications, past family history, past medical history, past social history, past surgical history and problem list.   Objective:  There were no vitals filed for this visit.  Fetal Status:     Movement: Present     General:  Alert, oriented and cooperative. Patient is in no acute distress.  Respiratory: Normal respiratory effort, no problems with respiration noted  Mental Status: Normal mood and affect. Normal behavior. Normal judgment and thought content.  Rest of physical exam deferred due to type of encounter  Assessment and Plan:  Pregnancy: [id number]  at [redacted]w[redacted]d 1. Supervision of high risk pregnancy, antepartum Stable Anatomy scan next week  2. Pre-existing essential hypertension during pregnancy, antepartum BP satble Continue with current Tx  Preterm labor symptoms and general obstetric precautions including but not limited to vaginal bleeding, contractions, leaking of fluid and fetal movement were reviewed in detail with the patient. I discussed the assessment and treatment plan with the patient. The patient was provided an opportunity to ask questions and all were answered. The patient agreed with the plan and demonstrated an understanding of the instructions. The patient was advised to call back or seek an in-person office evaluation/go to [facility] at [facility] for any urgent or concerning symptoms. Please refer to After Visit Summary for other counseling recommendations.   I provided 11 minutes of face-to-face via WebEx time during this encounter.  No follow-ups on file.  Future Appointments  Date Time Provider Department Center  06/30/2018  7:30 AM [facility] NURSE [facility]  06/30/2018  7:45 AM [facility] [place] 2 [facility] MFC-US    Hermina Staggers, MD Center for [facility], [facility]

## 2018-06-23 NOTE — Progress Notes (Signed)
I connected with  Casey Mcmahon on 06/23/18 at  8:15 AM EDT by telephone and verified that I am speaking with the correct person using two identifiers.   I discussed the limitations, risks, security and privacy concerns of performing an evaluation and management service by telephone and the availability of in person appointments. I also discussed with the patient that there may be a patient responsible charge related to this service. The patient expressed understanding and agreed to proceed.  [name] Jory Welke, CMA 06/23/2018  8:17 AM   Feels Movement  No Bleeding No Pain, No Pressure. No Bleeding or Discharge

## 2018-06-25 ENCOUNTER — Other Ambulatory Visit: Payer: Self-pay | Admitting: Obstetrics & Gynecology

## 2018-06-25 DIAGNOSIS — I10 Essential (primary) hypertension: Secondary | ICD-10-CM

## 2018-06-30 ENCOUNTER — Other Ambulatory Visit: Payer: Self-pay

## 2018-06-30 ENCOUNTER — Ambulatory Visit (HOSPITAL_COMMUNITY)
Admission: RE | Admit: 2018-06-30 | Discharge: 2018-06-30 | Disposition: A | Discharge status: Home / Self Care | Payer: No Typology Code available for payment source | Source: Ambulatory Visit | Attending: Obstetrics and Gynecology | Admitting: Obstetrics and Gynecology

## 2018-06-30 ENCOUNTER — Ambulatory Visit (HOSPITAL_COMMUNITY): Payer: No Typology Code available for payment source | Admitting: *Deleted

## 2018-06-30 ENCOUNTER — Other Ambulatory Visit (HOSPITAL_COMMUNITY): Payer: Self-pay | Admitting: *Deleted

## 2018-06-30 ENCOUNTER — Encounter (HOSPITAL_COMMUNITY): Payer: Self-pay

## 2018-06-30 VITALS — BP 114/75 | HR 98 | Temp 98.7°F

## 2018-06-30 DIAGNOSIS — O09521 Supervision of elderly multigravida, first trimester: Secondary | ICD-10-CM

## 2018-06-30 DIAGNOSIS — Z3A18 18 weeks gestation of pregnancy: Secondary | ICD-10-CM

## 2018-06-30 DIAGNOSIS — O10919 Unspecified pre-existing hypertension complicating pregnancy, unspecified trimester: Secondary | ICD-10-CM | POA: Insufficient documentation

## 2018-06-30 DIAGNOSIS — O0990 Supervision of high risk pregnancy, unspecified, unspecified trimester: Secondary | ICD-10-CM | POA: Insufficient documentation

## 2018-06-30 DIAGNOSIS — O09522 Supervision of elderly multigravida, second trimester: Secondary | ICD-10-CM

## 2018-06-30 DIAGNOSIS — O10012 Pre-existing essential hypertension complicating pregnancy, second trimester: Secondary | ICD-10-CM | POA: Diagnosis not present

## 2018-06-30 DIAGNOSIS — O99212 Obesity complicating pregnancy, second trimester: Secondary | ICD-10-CM | POA: Diagnosis not present

## 2018-07-20 ENCOUNTER — Telehealth: Payer: Self-pay | Admitting: Family Medicine

## 2018-07-20 NOTE — Telephone Encounter (Signed)
Called and spoke to the patient, she is aware that her appt is a Advertising account planner visit.

## 2018-07-24 ENCOUNTER — Telehealth (INDEPENDENT_AMBULATORY_CARE_PROVIDER_SITE_OTHER): Payer: No Typology Code available for payment source | Admitting: Obstetrics and Gynecology

## 2018-07-24 ENCOUNTER — Other Ambulatory Visit: Payer: Self-pay

## 2018-07-24 ENCOUNTER — Encounter: Payer: Self-pay | Admitting: Obstetrics and Gynecology

## 2018-07-24 VITALS — BP 114/78 | HR 94

## 2018-07-24 DIAGNOSIS — O0990 Supervision of high risk pregnancy, unspecified, unspecified trimester: Secondary | ICD-10-CM

## 2018-07-24 DIAGNOSIS — O09522 Supervision of elderly multigravida, second trimester: Secondary | ICD-10-CM

## 2018-07-24 DIAGNOSIS — Z3A22 22 weeks gestation of pregnancy: Secondary | ICD-10-CM

## 2018-07-24 DIAGNOSIS — O10012 Pre-existing essential hypertension complicating pregnancy, second trimester: Secondary | ICD-10-CM

## 2018-07-24 DIAGNOSIS — O10019 Pre-existing essential hypertension complicating pregnancy, unspecified trimester: Secondary | ICD-10-CM

## 2018-07-24 NOTE — Progress Notes (Signed)
TELEHEALTH OBSTETRICS PRENATAL VIRTUAL VIDEO VISIT ENCOUNTER NOTE  Provider location: Center for [employer] at [facility]   I connected with Casey Mcmahon on 07/24/18 at  2:35 PM EDT by MyChart Video Encounter at home and verified that I am speaking with the correct person using two identifiers.   I discussed the limitations, risks, security and privacy concerns of performing an evaluation and management service by telephone and the availability of in person appointments. I also discussed with the patient that there may be a patient responsible charge related to this service. The patient expressed understanding and agreed to proceed. Subjective:  Casey Mcmahon is a 33 y.o. [phone] at [redacted]w[redacted]d being seen today for ongoing prenatal care.  She is currently monitored for the following issues for this high-risk pregnancy and has Obesity; High blood pressure; PCOS (polycystic ovarian syndrome); Supervision of high risk pregnancy, antepartum; Hypertension in pregnancy, antepartum; GERD (gastroesophageal reflux disease); Asthma, mild intermittent; Prediabetes; Migraines; and AMA (advanced maternal age) multigravida 35+ on their problem list.  Patient reports general discomforts of pregnancy.  Contractions: Not present. Vag. Bleeding: None.  Movement: Present. Denies any leaking of fluid.   The following portions of the patient's history were reviewed and updated as appropriate: allergies, current medications, past family history, past medical history, past social history, past surgical history and problem list.   Objective:   Vitals:   07/24/18 1431  BP: 114/78  Pulse: 94    Fetal Status:     Movement: Present     General:  Alert, oriented and cooperative. Patient is in no acute distress.  Respiratory: Normal respiratory effort, no problems with respiration noted  Mental Status: Normal mood and affect. Normal behavior. Normal judgment and thought content.  Rest of physical exam  deferred due to type of encounter  Imaging: [place] Mfm Ob Detail +14 Wk  Result Date: 06/30/2018 ----------------------------------------------------------------------  OBSTETRICS REPORT                       (Signed Final 06/30/2018 09:06 am) ---------------------------------------------------------------------- Patient Info  ID #:       [id number]                          D.O.B.:  Apr 08, 1985 (32 yrs)  Name:       Casey Mcmahon                 Visit Date: 06/30/2018 07:53 am ---------------------------------------------------------------------- Performed By  Performed By:     Hurman Horn          Ref. Address:     [address]                    RD[address]                                                             [address]  Attending:        Lin Landsman      [facility]  [facility] ---------------------------------------------------------------------- Orders   #  Description                          Code         Ordered By   1  US MFM OB DETAIL +14 WK              76811.01     TANYA PRATT  ----------------------------------------------------------------------   #  Order #                    Accession #                 Episode #   1  [id number]                  [id number]  ---------------------------------------------------------------------- Indications   Obesity complicating pregnancy, second         O99.212   trimester   Advanced maternal age multigravida 10335+,        24O09.522   second trimester (low risk NIPs)   Hypertension - Chronic/Pre-existing            O10.019   (labetalol)   [redacted] weeks gestation of pregnancy                Z3A.18  ---------------------------------------------------------------------- Vital Signs  Weight (lb): 231                               Height:        5'8"  BMI:         35.12 ----------------------------------------------------------------------  Fetal Evaluation  Num Of Fetuses:         1  Fetal Heart Rate(bpm):  142  Cardiac Activity:       Observed  Presentation:           Cephalic  Placenta:               Posterior  P. Cord Insertion:      Visualized  Amniotic Fluid  AFI FV:      Within normal limits                              Largest Pocket(cm)                              4.0 ---------------------------------------------------------------------- Biometry  BPD:      44.7  mm     G. Age:  19w 4d         86  %    CI:        72.86   %    70 - 86                                                          FL/HC:      16.8   %    16.1 - 18.3  HC:      166.5  mm     G. Age:  19w 2d         78  %    HC/AC:      1.20        1.09 - 1.39  AC:       139   mm     G. Age:  19w 2d         71  %    FL/BPD:     62.4   %  FL:       27.9  mm     G. Age:  18w 4d         43  %    FL/AC:      20.1   %    20 - 24  HUM:      26.9  mm     G. Age:  18w 4d         52  %  CER:      19.9  mm     G. Age:  19w 0d         64  %  LV:        6.7  mm  Est. FW:     269  gm      0 lb 9 oz     53  % ---------------------------------------------------------------------- Gestational Age  U/S Today:     19w 1d                                        EDD:   11/23/18  Best:          18w 4d     Det. By:  U/S C R L  (04/17/18)    EDD:   11/27/18 ---------------------------------------------------------------------- Anatomy  Cranium:               Appears normal         Aortic Arch:            Appears normal  Cavum:                 Appears normal         Ductal Arch:            Appears normal  Ventricles:            Appears normal         Diaphragm:              Appears normal  Choroid Plexus:        Appears normal         Stomach:                Appears normal, left                                                                        sided  Cerebellum:            Appears normal         Abdomen:                Appears normal  Posterior  Fossa:       Not well visualized    Abdominal Wall:          Appears nml (cord                                                                        insert, abd wall)  Nuchal Fold:           Not well visualized    Cord Vessels:           Appears normal (3                                                                        vessel cord)  Face:                  Orbits nl; profile not Kidneys:                Appear normal                         well visualized  Lips:                  Not well visualized    Bladder:                Appears normal  Thoracic:              Appears normal         Spine:                  Appears normal  Heart:                 Echogenic focus        Upper Extremities:      Appears normal                         in LV  RVOT:                  Appears normal         Lower Extremities:      Appears normal  LVOT:                  Appears normal  Other:  Fetus appears to be a female. Heels and 5th digit visualized.          Technically difficult due to maternal habitus and fetal position. ---------------------------------------------------------------------- Cervix Uterus Adnexa  Cervix  Length:           3.49  cm.  Normal appearance by transabdominal scan.  Uterus  No abnormality visualized.  Left Ovary  Within normal limits.  Right Ovary  Within normal limits.  Adnexa  No abnormality visualized. ---------------------------------------------------------------------- Impression  Normal interval growth.  No ultrasonic evidence of structural  fetal anomalies.  Isolated left ventricular echogenic intracardiac foci- we  discussed the low risk nature of this  finding in the context of a  low risk NIPS and no additional findings are noted.  Chronic hypertension on medications. BP stable  AMA- 33 yo ---------------------------------------------------------------------- Recommendations  Repeat growth in 4-5 weeks.  Continue serial growth every 4 weeks  Initiate weekly testing at 32 weeks.  Delivery at 37-39 weeks pending BP control  ----------------------------------------------------------------------               Lin Landsmanorenthian Booker, MD Electronically Signed Final Report   06/30/2018 09:06 am ----------------------------------------------------------------------   Assessment and Plan:  Pregnancy: [id number] at 613w0d 1. Supervision of high risk pregnancy, antepartum Stable Glucola next visit  2. Pre-existing essential hypertension during pregnancy, antepartum BP's stable Continue with Labetalol and BASA Growth U/S on 07/28/18   3. Multigravida of advanced maternal age in second trimester LR NIPS  Preterm labor symptoms and general obstetric precautions including but not limited to vaginal bleeding, contractions, leaking of fluid and fetal movement were reviewed in detail with the patient. I discussed the assessment and treatment plan with the patient. The patient was provided an opportunity to ask questions and all were answered. The patient agreed with the plan and demonstrated an understanding of the instructions. The patient was advised to call back or seek an in-person office evaluation/[facility] for any urgent or concerning symptoms. Please refer to After Visit Summary for other counseling recommendations.   I provided 10 minutes of face-to-face time during this encounter.  Return in about 4 weeks (around 08/21/2018) for OB visit, face to face for glucola.  Future Appointments  Date Time Provider Department Center  07/28/2018  7:45 AM WH-MFC NURSE WH-MFC MFC-US  07/28/2018  7:45 AM WH-MFC US 2 WH-MFCUS MFC-US    Hermina StaggersMichael L Keeva Reisen, MD [facility], [facility]

## 2018-07-28 ENCOUNTER — Ambulatory Visit (HOSPITAL_COMMUNITY): Payer: No Typology Code available for payment source

## 2018-08-02 ENCOUNTER — Other Ambulatory Visit: Payer: Self-pay

## 2018-08-02 ENCOUNTER — Other Ambulatory Visit (HOSPITAL_COMMUNITY): Payer: Self-pay | Admitting: *Deleted

## 2018-08-02 ENCOUNTER — Ambulatory Visit (HOSPITAL_COMMUNITY): Payer: No Typology Code available for payment source | Admitting: *Deleted

## 2018-08-02 ENCOUNTER — Ambulatory Visit (HOSPITAL_COMMUNITY)
Admission: RE | Admit: 2018-08-02 | Discharge: 2018-08-02 | Disposition: A | Discharge status: Home / Self Care | Payer: No Typology Code available for payment source | Source: Ambulatory Visit | Attending: Maternal & Fetal Medicine | Admitting: Maternal & Fetal Medicine

## 2018-08-02 ENCOUNTER — Encounter (HOSPITAL_COMMUNITY): Payer: Self-pay | Admitting: *Deleted

## 2018-08-02 DIAGNOSIS — O10919 Unspecified pre-existing hypertension complicating pregnancy, unspecified trimester: Secondary | ICD-10-CM | POA: Insufficient documentation

## 2018-08-02 DIAGNOSIS — Z362 Encounter for other antenatal screening follow-up: Secondary | ICD-10-CM

## 2018-08-02 DIAGNOSIS — O99212 Obesity complicating pregnancy, second trimester: Secondary | ICD-10-CM

## 2018-08-02 DIAGNOSIS — O09529 Supervision of elderly multigravida, unspecified trimester: Secondary | ICD-10-CM | POA: Diagnosis present

## 2018-08-02 DIAGNOSIS — Z3A23 23 weeks gestation of pregnancy: Secondary | ICD-10-CM

## 2018-08-02 DIAGNOSIS — O09522 Supervision of elderly multigravida, second trimester: Secondary | ICD-10-CM | POA: Diagnosis not present

## 2018-08-02 DIAGNOSIS — O10012 Pre-existing essential hypertension complicating pregnancy, second trimester: Secondary | ICD-10-CM | POA: Diagnosis not present

## 2018-08-14 ENCOUNTER — Ambulatory Visit (HOSPITAL_COMMUNITY): Payer: No Typology Code available for payment source

## 2018-08-21 ENCOUNTER — Ambulatory Visit (INDEPENDENT_AMBULATORY_CARE_PROVIDER_SITE_OTHER): Payer: No Typology Code available for payment source | Admitting: Obstetrics & Gynecology

## 2018-08-21 ENCOUNTER — Other Ambulatory Visit: Payer: Self-pay | Admitting: *Deleted

## 2018-08-21 ENCOUNTER — Other Ambulatory Visit: Payer: No Typology Code available for payment source

## 2018-08-21 ENCOUNTER — Other Ambulatory Visit: Payer: Self-pay

## 2018-08-21 VITALS — BP 118/80 | HR 102 | Temp 98.2°F | Wt 232.0 lb

## 2018-08-21 DIAGNOSIS — O0990 Supervision of high risk pregnancy, unspecified, unspecified trimester: Secondary | ICD-10-CM

## 2018-08-21 DIAGNOSIS — O09522 Supervision of elderly multigravida, second trimester: Secondary | ICD-10-CM

## 2018-08-21 DIAGNOSIS — Z3A26 26 weeks gestation of pregnancy: Secondary | ICD-10-CM

## 2018-08-21 DIAGNOSIS — Z23 Encounter for immunization: Secondary | ICD-10-CM | POA: Diagnosis not present

## 2018-08-21 DIAGNOSIS — O10019 Pre-existing essential hypertension complicating pregnancy, unspecified trimester: Secondary | ICD-10-CM

## 2018-08-21 DIAGNOSIS — O0992 Supervision of high risk pregnancy, unspecified, second trimester: Secondary | ICD-10-CM

## 2018-08-21 DIAGNOSIS — O10012 Pre-existing essential hypertension complicating pregnancy, second trimester: Secondary | ICD-10-CM

## 2018-08-22 ENCOUNTER — Other Ambulatory Visit: Payer: Self-pay | Admitting: Lactation Services

## 2018-08-22 ENCOUNTER — Encounter: Payer: No Typology Code available for payment source | Attending: Obstetrics and Gynecology | Admitting: *Deleted

## 2018-08-22 ENCOUNTER — Ambulatory Visit: Payer: No Typology Code available for payment source | Admitting: *Deleted

## 2018-08-22 ENCOUNTER — Telehealth: Payer: Self-pay | Admitting: Obstetrics & Gynecology

## 2018-08-22 DIAGNOSIS — O24419 Gestational diabetes mellitus in pregnancy, unspecified control: Secondary | ICD-10-CM | POA: Insufficient documentation

## 2018-08-22 DIAGNOSIS — Z3A00 Weeks of gestation of pregnancy not specified: Secondary | ICD-10-CM | POA: Insufficient documentation

## 2018-08-22 DIAGNOSIS — R7303 Prediabetes: Secondary | ICD-10-CM

## 2018-08-22 LAB — CBC
Hematocrit: 32.4 % — ABNORMAL LOW (ref 34.0–46.6)
Hemoglobin: 11 g/dL — ABNORMAL LOW (ref 11.1–15.9)
MCH: 31.5 pg (ref 26.6–33.0)
MCHC: 34 g/dL (ref 31.5–35.7)
MCV: 93 fL (ref 79–97)
Platelets: 217 10*3/uL (ref 150–450)
RBC: 3.49 x10E6/uL — ABNORMAL LOW (ref 3.77–5.28)
RDW: 12.8 % (ref 11.7–15.4)
WBC: 9.9 10*3/uL (ref 3.4–10.8)

## 2018-08-22 LAB — GLUCOSE TOLERANCE, 2 HOURS W/ 1HR
Glucose, 1 hour: 236 mg/dL — ABNORMAL HIGH (ref 65–179)
Glucose, 2 hour: 200 mg/dL — ABNORMAL HIGH (ref 65–152)
Glucose, Fasting: 96 mg/dL — ABNORMAL HIGH (ref 65–91)

## 2018-08-22 LAB — RPR: RPR Ser Ql: NONREACTIVE

## 2018-08-22 LAB — HIV ANTIBODY (ROUTINE TESTING W REFLEX): HIV Screen 4th Generation wRfx: NONREACTIVE

## 2018-08-22 MED ORDER — GLUCOSE BLOOD VI STRP: ORAL_STRIP | 12 refills | Status: DC

## 2018-08-22 MED ORDER — ONETOUCH VERIO FLEX SYSTEM W/DEVICE KIT: 1.0000 | PACK | Freq: Once | 0 refills | Status: AC

## 2018-08-22 MED ORDER — ONETOUCH ULTRASOFT LANCETS MISC: 12 refills | Status: DC

## 2018-08-22 NOTE — Progress Notes (Signed)
   PRENATAL VISIT NOTE  Subjective:  Casey Mcmahon is a 33 y.o. [phone] at [redacted]w[redacted]d being seen today for ongoing prenatal care.  She is currently monitored for the following issues for this high-risk pregnancy and has Obesity; High blood pressure; PCOS (polycystic ovarian syndrome); Supervision of high risk pregnancy, antepartum; Hypertension in pregnancy, antepartum; GERD (gastroesophageal reflux disease); Asthma, mild intermittent; Prediabetes; Migraines; and AMA (advanced maternal age) multigravida 35+ on their problem list.  Patient reports no complaints.  Contractions: Not present. Vag. Bleeding: None.  Movement: Present. Denies leaking of fluid.   The following portions of the patient's history were reviewed and updated as appropriate: allergies, current medications, past family history, past medical history, past social history, past surgical history and problem list.   Objective:   Vitals:   08/21/18 0841  BP: 118/80  Pulse: (!) 102  Temp: 98.2 F (36.8 C)  Weight: 232 lb (105.2 kg)    Fetal Status: Fetal Heart Rate (bpm): 140   Movement: Present     General:  Alert, oriented and cooperative. Patient is in no acute distress.  Skin: Skin is warm and dry. No rash noted.   Cardiovascular: Normal heart rate noted  Respiratory: Normal respiratory effort, no problems with respiration noted  Abdomen: Soft, gravid, appropriate for gestational age.  Pain/Pressure: Present     Pelvic: Cervical exam deferred        Extremities: Normal range of motion.  Edema: None  Mental Status: Normal mood and affect. Normal behavior. Normal judgment and thought content.   Assessment and Plan:  Pregnancy: [id number] at [redacted]w[redacted]d 1. Supervision of high risk pregnancy, antepartum  - Tdap vaccine greater than or equal to 7yo IM  2. Pre-existing essential hypertension during pregnancy, antepartum - 2 hour GTT today  3. Multigravida of advanced maternal age in second trimester   Preterm labor symptoms  and general obstetric precautions including but not limited to vaginal bleeding, contractions, leaking of fluid and fetal movement were reviewed in detail with the patient. Please refer to After Visit Summary for other counseling recommendations.   Return in about 3 weeks (around 09/11/2018) for virtual.  Future Appointments  Date Time Provider [place]  08/30/2018  8:00 AM [place] MFC-US  08/30/2018  8:00 AM [place] 3 WH-MFCUS MFC-US  09/11/2018  3:15 PM Emily Filbert, MD WOC-WOCA WOC    Emily Filbert, MD

## 2018-08-22 NOTE — Telephone Encounter (Signed)
I left her a voicemail about her diagnosis of DM. I asked her to call the office and let us know if she already has a meter/supplies (She has a h/o GDM). If not, she will need supplies prescribed along with a consult with nutritonist/[employer].

## 2018-08-22 NOTE — Progress Notes (Signed)
  Patient was seen on 08/22/2018 for Gestational Diabetes self-management. EDD 11/23/2018. Patient states no history of GDM but was diagnosed with pre-diabetes prior to this pregnancy. Diet history obtained. Patient eats good variety of all food groups but does enjoy sweet foods and syrup often. Beverages include lemonade, OJ and water.  The following learning objectives were met by the patient :   States the definition of Gestational Diabetes  States why dietary management is important in controlling blood glucose  Describes the effects of carbohydrates on blood glucose levels  Demonstrates ability to create a balanced meal plan  Demonstrates carbohydrate counting   States when to check blood glucose levels  Demonstrates proper blood glucose monitoring techniques  States the effect of stress and exercise on blood glucose levels  States the importance of limiting caffeine and abstaining from alcohol and smoking  Plan:  Aim for 3 Carb Choices per meal (45 grams) +/- 1 either way  Aim for 1-2 Carbs per snack Begin reading food labels for Total Carbohydrate of foods If OK with your MD, consider  increasing your activity level by walking, Arm Chair Exercises or other activity daily as tolerated Begin checking BG before breakfast and 2 hours after first bite of breakfast, lunch and dinner as directed by MD  Bring Log Book/Sheet and meter to every medical appointment OR use Baby Scripts (see below) Baby Scripts:  Patient was introduced to [employer] and plans to use as record of BG electronically  Take medication if directed by MD  Blood glucose monitor Rx called into pharmacy: One Touch Verio with strips and lancets Patient instructed to test pre breakfast and 2 hours each meal as directed by MD  Patient instructed to monitor glucose levels: FBS: 60 - 95 mg/dl 2 hour: <120 mg/dl  Patient received the following handouts:  Nutrition Diabetes and Pregnancy  Carbohydrate Counting  List  A1c handout  Patient will be seen for follow-up as needed.

## 2018-08-22 NOTE — Progress Notes (Signed)
Opened in error

## 2018-08-30 ENCOUNTER — Ambulatory Visit (HOSPITAL_COMMUNITY): Payer: No Typology Code available for payment source | Admitting: *Deleted

## 2018-08-30 ENCOUNTER — Encounter (HOSPITAL_COMMUNITY): Payer: Self-pay

## 2018-08-30 ENCOUNTER — Ambulatory Visit (HOSPITAL_COMMUNITY)
Admission: RE | Admit: 2018-08-30 | Discharge: 2018-08-30 | Disposition: A | Discharge status: Home / Self Care | Payer: No Typology Code available for payment source | Source: Ambulatory Visit | Attending: Obstetrics and Gynecology | Admitting: Obstetrics and Gynecology

## 2018-08-30 ENCOUNTER — Other Ambulatory Visit (HOSPITAL_COMMUNITY): Payer: Self-pay | Admitting: *Deleted

## 2018-08-30 ENCOUNTER — Other Ambulatory Visit: Payer: Self-pay

## 2018-08-30 DIAGNOSIS — O99212 Obesity complicating pregnancy, second trimester: Secondary | ICD-10-CM

## 2018-08-30 DIAGNOSIS — O10919 Unspecified pre-existing hypertension complicating pregnancy, unspecified trimester: Secondary | ICD-10-CM | POA: Diagnosis not present

## 2018-08-30 DIAGNOSIS — O24410 Gestational diabetes mellitus in pregnancy, diet controlled: Secondary | ICD-10-CM

## 2018-08-30 DIAGNOSIS — Z3A27 27 weeks gestation of pregnancy: Secondary | ICD-10-CM

## 2018-08-30 DIAGNOSIS — O09522 Supervision of elderly multigravida, second trimester: Secondary | ICD-10-CM | POA: Insufficient documentation

## 2018-08-30 DIAGNOSIS — O10012 Pre-existing essential hypertension complicating pregnancy, second trimester: Secondary | ICD-10-CM | POA: Diagnosis not present

## 2018-08-30 DIAGNOSIS — Z362 Encounter for other antenatal screening follow-up: Secondary | ICD-10-CM | POA: Diagnosis not present

## 2018-09-11 ENCOUNTER — Telehealth (INDEPENDENT_AMBULATORY_CARE_PROVIDER_SITE_OTHER): Payer: No Typology Code available for payment source | Admitting: Obstetrics & Gynecology

## 2018-09-11 ENCOUNTER — Other Ambulatory Visit: Payer: Self-pay

## 2018-09-11 VITALS — BP 130/73 | HR 102 | Wt 232.4 lb

## 2018-09-11 DIAGNOSIS — O24419 Gestational diabetes mellitus in pregnancy, unspecified control: Secondary | ICD-10-CM

## 2018-09-11 DIAGNOSIS — O09523 Supervision of elderly multigravida, third trimester: Secondary | ICD-10-CM

## 2018-09-11 DIAGNOSIS — O10019 Pre-existing essential hypertension complicating pregnancy, unspecified trimester: Secondary | ICD-10-CM

## 2018-09-11 DIAGNOSIS — O09521 Supervision of elderly multigravida, first trimester: Secondary | ICD-10-CM

## 2018-09-11 DIAGNOSIS — O0993 Supervision of high risk pregnancy, unspecified, third trimester: Secondary | ICD-10-CM

## 2018-09-11 DIAGNOSIS — E6609 Other obesity due to excess calories: Secondary | ICD-10-CM

## 2018-09-11 DIAGNOSIS — Z3A29 29 weeks gestation of pregnancy: Secondary | ICD-10-CM

## 2018-09-11 DIAGNOSIS — O09522 Supervision of elderly multigravida, second trimester: Secondary | ICD-10-CM

## 2018-09-11 DIAGNOSIS — O0990 Supervision of high risk pregnancy, unspecified, unspecified trimester: Secondary | ICD-10-CM

## 2018-09-11 DIAGNOSIS — O10013 Pre-existing essential hypertension complicating pregnancy, third trimester: Secondary | ICD-10-CM

## 2018-09-11 MED ORDER — METFORMIN HCL 500 MG PO TABS: ORAL_TABLET | ORAL | 6 refills | Status: DC

## 2018-09-11 NOTE — Progress Notes (Signed)
   PRENATAL VISIT NOTE  Subjective:  Casey Mcmahon is a 33 y.o. [phone] at [redacted]w[redacted]d being seen today for ongoing prenatal care.  She is currently monitored for the following issues for this high-risk pregnancy and has Obesity; High blood pressure; PCOS (polycystic ovarian syndrome); Supervision of high risk pregnancy, antepartum; Hypertension in pregnancy, antepartum; GERD (gastroesophageal reflux disease); Asthma, mild intermittent; Migraines; AMA (advanced maternal age) multigravida 35+; and GDM (gestational diabetes mellitus) on their problem list.  Patient reports no complaints.  Contractions: Not present. Vag. Bleeding: None.  Movement: Present. Denies leaking of fluid.   The following portions of the patient's history were reviewed and updated as appropriate: allergies, current medications, past family history, past medical history, past social history, past surgical history and problem list.   Objective:   Vitals:   09/11/18 1527  BP: 130/73  Pulse: (!) 102    Fetal Status:     Movement: Present     General:  Alert, oriented and cooperative. Patient is in no acute distress.  Skin: Skin is warm and dry. No rash noted.   Cardiovascular: Normal heart rate noted  Respiratory: Normal respiratory effort, no problems with respiration noted  Abdomen: Soft, gravid, appropriate for gestational age.  Pain/Pressure: Absent     Pelvic: Cervical exam deferred        Extremities: Normal range of motion.  Edema: None  Mental Status: Normal mood and affect. Normal behavior. Normal judgment and thought content.   Assessment and Plan:  Pregnancy: G3T5176 at [redacted]w[redacted]d 1. Multigravida of advanced maternal age in second trimester   2. Supervision of high risk pregnancy, antepartum   3. Pre-existing essential hypertension during pregnancy, antepartum - taking labetalol and baby asa daily - MFM u/s and BPPs scheduled  4. Multigravida of advanced maternal age in first trimester   5. Obesity due  to excess calories with serious comorbidity, unspecified classification   6. Gestational diabetes mellitus (GDM), antepartum, gestational diabetes method of control unspecified - her fastings are consistently above 90 so she will start taking metformin 500 mg qhs - her 2 hour PCs are generally less than 120  Preterm labor symptoms and general obstetric precautions including but not limited to vaginal bleeding, contractions, leaking of fluid and fetal movement were reviewed in detail with the patient. Please refer to After Visit Summary for other counseling recommendations.   No follow-ups on file.  Future Appointments  Date Time Provider [place]  10/04/2018  8:15 AM [place] MFC-US  10/04/2018  8:15 AM WH-MFC [place] 4 WH-MFCUS MFC-US    Emily Filbert, MD

## 2018-09-18 ENCOUNTER — Other Ambulatory Visit: Payer: Self-pay | Admitting: Obstetrics & Gynecology

## 2018-09-18 DIAGNOSIS — I10 Essential (primary) hypertension: Secondary | ICD-10-CM

## 2018-09-29 ENCOUNTER — Telehealth: Payer: Self-pay | Admitting: Obstetrics & Gynecology

## 2018-09-29 NOTE — Telephone Encounter (Signed)
Called the patient to inform of the upcoming mychart visit. The patient verbalized understanding. °

## 2018-10-02 ENCOUNTER — Encounter: Payer: Self-pay | Admitting: Obstetrics & Gynecology

## 2018-10-02 ENCOUNTER — Telehealth (INDEPENDENT_AMBULATORY_CARE_PROVIDER_SITE_OTHER): Payer: No Typology Code available for payment source | Admitting: Obstetrics & Gynecology

## 2018-10-02 ENCOUNTER — Other Ambulatory Visit: Payer: Self-pay

## 2018-10-02 VITALS — BP 112/76 | HR 89

## 2018-10-02 DIAGNOSIS — O10013 Pre-existing essential hypertension complicating pregnancy, third trimester: Secondary | ICD-10-CM

## 2018-10-02 DIAGNOSIS — O09529 Supervision of elderly multigravida, unspecified trimester: Secondary | ICD-10-CM

## 2018-10-02 DIAGNOSIS — O10019 Pre-existing essential hypertension complicating pregnancy, unspecified trimester: Secondary | ICD-10-CM

## 2018-10-02 DIAGNOSIS — O99210 Obesity complicating pregnancy, unspecified trimester: Secondary | ICD-10-CM

## 2018-10-02 DIAGNOSIS — Z3A32 32 weeks gestation of pregnancy: Secondary | ICD-10-CM

## 2018-10-02 DIAGNOSIS — K219 Gastro-esophageal reflux disease without esophagitis: Secondary | ICD-10-CM

## 2018-10-02 DIAGNOSIS — J4520 Mild intermittent asthma, uncomplicated: Secondary | ICD-10-CM

## 2018-10-02 DIAGNOSIS — O99213 Obesity complicating pregnancy, third trimester: Secondary | ICD-10-CM

## 2018-10-02 DIAGNOSIS — O0993 Supervision of high risk pregnancy, unspecified, third trimester: Secondary | ICD-10-CM

## 2018-10-02 DIAGNOSIS — O24414 Gestational diabetes mellitus in pregnancy, insulin controlled: Secondary | ICD-10-CM

## 2018-10-02 DIAGNOSIS — O09523 Supervision of elderly multigravida, third trimester: Secondary | ICD-10-CM

## 2018-10-02 DIAGNOSIS — O0990 Supervision of high risk pregnancy, unspecified, unspecified trimester: Secondary | ICD-10-CM

## 2018-10-02 NOTE — Progress Notes (Signed)
   [place] VIRTUAL VIDEO VISIT ENCOUNTER NOTE  Provider location: Center for [employer] at [facility]   I connected with Casey Mcmahon on 10/02/18 at  2:35 PM EDT by MyChart Video Encounter at home and verified that I am speaking with the correct person using two identifiers.   I discussed the limitations, risks, security and privacy concerns of performing an evaluation and management service virtually and the availability of in person appointments. I also discussed with the patient that there may be a patient responsible charge related to this service. The patient expressed understanding and agreed to proceed. Subjective:  Casey Mcmahon is a 33 y.o. [phone] at [redacted]w[redacted]d being seen today for ongoing prenatal care.  She is currently monitored for the following issues for this high-risk pregnancy and has Obesity; High blood pressure; PCOS (polycystic ovarian syndrome); Supervision of high risk pregnancy, antepartum; Hypertension in pregnancy, antepartum; GERD (gastroesophageal reflux disease); Asthma, mild intermittent; Migraines; AMA (advanced maternal age) multigravida 35+; GDM (gestational diabetes mellitus); and Obesity affecting pregnancy, antepartum on their problem list.  Patient reports no complaints.  Contractions: Irritability. Vag. Bleeding: None.  Movement: Present. Denies any leaking of fluid.   The following portions of the patient's history were reviewed and updated as appropriate: allergies, current medications, past family history, past medical history, past social history, past surgical history and problem list.   Objective:   Vitals:   10/02/18 1445  BP: 112/76  Pulse: 89    Fetal Status:     Movement: Present     General:  Alert, oriented and cooperative. Patient is in no acute distress.  Respiratory: Normal respiratory effort, no problems with respiration noted  Mental Status: Normal mood and affect. Normal behavior. Normal judgment and  thought content.  Rest of physical exam deferred due to type of encounter  Imaging: No results found.  Assessment and Plan:  Pregnancy: [id number] at [redacted]w[redacted]d 1. Supervision of high risk pregnancy, antepartum Good FM occ BH ctx Needs to begin antenatal testing    2. Antepartum multigravida of advanced maternal age  30. Mild intermittent asthma without complication Well controlled. No inhaler use  4. Pre-existing essential hypertension during pregnancy, antepartum Taking baby ASA Taking BP weekly. Last 116/60's   5. Insulin controlled gestational diabetes mellitus (GDM) during pregnancy, antepartum Metformin 500mg  qHS All glc except 1 were WNL. Reviewed what pt ate prior to that elevated level.    6. Gastroesophageal reflux disease without esophagitis  7. Obesity affecting pregnancy, antepartum  Preterm labor symptoms and general obstetric precautions including but not limited to vaginal bleeding, contractions, leaking of fluid and fetal movement were reviewed in detail with the patient. I discussed the assessment and treatment plan with the patient. The patient was provided an opportunity to ask questions and all were answered. The patient agreed with the plan and demonstrated an understanding of the instructions. The patient was advised to call back or seek an in-person office evaluation/go to MAU at [facility] for any urgent or concerning symptoms. Please refer to After Visit Summary for other counseling recommendations.   I provided 12 minutes of face-to-face time during this encounter.  Return in about 2 weeks (around 10/16/2018) for Web based.  Future Appointments  Date Time Provider [place]  10/04/2018  8:15 AM [place] MFC-US  10/04/2018  8:15 AM [place] 4 WH-MFCUS MFC-US    Lavonia Drafts, MD Center for [facility], [place]

## 2018-10-02 NOTE — Progress Notes (Signed)
Glucose Readings from BRx:  She stated Breakfast for today was 110.

## 2018-10-04 ENCOUNTER — Other Ambulatory Visit (HOSPITAL_COMMUNITY): Payer: Self-pay | Admitting: *Deleted

## 2018-10-04 ENCOUNTER — Ambulatory Visit (HOSPITAL_COMMUNITY)
Admission: RE | Admit: 2018-10-04 | Discharge: 2018-10-04 | Disposition: A | Discharge status: Home / Self Care | Payer: No Typology Code available for payment source | Source: Ambulatory Visit | Attending: Maternal & Fetal Medicine | Admitting: Maternal & Fetal Medicine

## 2018-10-04 ENCOUNTER — Ambulatory Visit (HOSPITAL_COMMUNITY): Payer: No Typology Code available for payment source | Admitting: *Deleted

## 2018-10-04 ENCOUNTER — Other Ambulatory Visit: Payer: Self-pay

## 2018-10-04 DIAGNOSIS — O09529 Supervision of elderly multigravida, unspecified trimester: Secondary | ICD-10-CM

## 2018-10-04 DIAGNOSIS — O24415 Gestational diabetes mellitus in pregnancy, controlled by oral hypoglycemic drugs: Secondary | ICD-10-CM | POA: Diagnosis present

## 2018-10-04 DIAGNOSIS — O24410 Gestational diabetes mellitus in pregnancy, diet controlled: Secondary | ICD-10-CM | POA: Diagnosis not present

## 2018-10-04 DIAGNOSIS — O99213 Obesity complicating pregnancy, third trimester: Secondary | ICD-10-CM | POA: Diagnosis not present

## 2018-10-04 DIAGNOSIS — O10013 Pre-existing essential hypertension complicating pregnancy, third trimester: Secondary | ICD-10-CM

## 2018-10-04 DIAGNOSIS — O09523 Supervision of elderly multigravida, third trimester: Secondary | ICD-10-CM | POA: Diagnosis present

## 2018-10-04 DIAGNOSIS — O99210 Obesity complicating pregnancy, unspecified trimester: Secondary | ICD-10-CM | POA: Diagnosis present

## 2018-10-04 DIAGNOSIS — Z3A32 32 weeks gestation of pregnancy: Secondary | ICD-10-CM

## 2018-10-04 DIAGNOSIS — Z362 Encounter for other antenatal screening follow-up: Secondary | ICD-10-CM | POA: Diagnosis not present

## 2018-10-11 ENCOUNTER — Ambulatory Visit (HOSPITAL_COMMUNITY): Payer: No Typology Code available for payment source | Admitting: *Deleted

## 2018-10-11 ENCOUNTER — Ambulatory Visit (HOSPITAL_COMMUNITY)
Admission: RE | Admit: 2018-10-11 | Discharge: 2018-10-11 | Disposition: A | Discharge status: Home / Self Care | Payer: No Typology Code available for payment source | Source: Ambulatory Visit | Attending: Obstetrics and Gynecology | Admitting: Obstetrics and Gynecology

## 2018-10-11 ENCOUNTER — Other Ambulatory Visit: Payer: Self-pay

## 2018-10-11 VITALS — BP 117/63 | HR 90 | Temp 98.7°F

## 2018-10-11 DIAGNOSIS — O24415 Gestational diabetes mellitus in pregnancy, controlled by oral hypoglycemic drugs: Secondary | ICD-10-CM | POA: Diagnosis present

## 2018-10-11 DIAGNOSIS — O09529 Supervision of elderly multigravida, unspecified trimester: Secondary | ICD-10-CM | POA: Diagnosis present

## 2018-10-11 DIAGNOSIS — O24410 Gestational diabetes mellitus in pregnancy, diet controlled: Secondary | ICD-10-CM

## 2018-10-11 DIAGNOSIS — O10013 Pre-existing essential hypertension complicating pregnancy, third trimester: Secondary | ICD-10-CM | POA: Diagnosis not present

## 2018-10-11 DIAGNOSIS — O09523 Supervision of elderly multigravida, third trimester: Secondary | ICD-10-CM | POA: Diagnosis present

## 2018-10-11 DIAGNOSIS — O99213 Obesity complicating pregnancy, third trimester: Secondary | ICD-10-CM

## 2018-10-11 DIAGNOSIS — O99210 Obesity complicating pregnancy, unspecified trimester: Secondary | ICD-10-CM

## 2018-10-11 DIAGNOSIS — Z3A33 33 weeks gestation of pregnancy: Secondary | ICD-10-CM

## 2018-10-17 ENCOUNTER — Telehealth: Payer: Self-pay | Admitting: Family Medicine

## 2018-10-17 NOTE — Telephone Encounter (Signed)
Spoke to patient about her appointment on 9/9 @ 10:00. Patient instructed to wear a face mask for the entire appointment and no visitors are allowed. Patient screened for covid symptoms and denied having any.

## 2018-10-18 ENCOUNTER — Ambulatory Visit (HOSPITAL_COMMUNITY)
Admission: RE | Admit: 2018-10-18 | Discharge: 2018-10-18 | Disposition: A | Discharge status: Home / Self Care | Payer: No Typology Code available for payment source | Source: Ambulatory Visit | Attending: Obstetrics and Gynecology | Admitting: Obstetrics and Gynecology

## 2018-10-18 ENCOUNTER — Encounter (HOSPITAL_COMMUNITY): Payer: Self-pay

## 2018-10-18 ENCOUNTER — Other Ambulatory Visit: Payer: Self-pay

## 2018-10-18 ENCOUNTER — Ambulatory Visit (HOSPITAL_COMMUNITY): Payer: No Typology Code available for payment source | Admitting: *Deleted

## 2018-10-18 ENCOUNTER — Ambulatory Visit (INDEPENDENT_AMBULATORY_CARE_PROVIDER_SITE_OTHER): Payer: No Typology Code available for payment source

## 2018-10-18 DIAGNOSIS — Z23 Encounter for immunization: Secondary | ICD-10-CM

## 2018-10-18 DIAGNOSIS — O99213 Obesity complicating pregnancy, third trimester: Secondary | ICD-10-CM

## 2018-10-18 DIAGNOSIS — O99210 Obesity complicating pregnancy, unspecified trimester: Secondary | ICD-10-CM | POA: Insufficient documentation

## 2018-10-18 DIAGNOSIS — O10013 Pre-existing essential hypertension complicating pregnancy, third trimester: Secondary | ICD-10-CM

## 2018-10-18 DIAGNOSIS — O09523 Supervision of elderly multigravida, third trimester: Secondary | ICD-10-CM | POA: Insufficient documentation

## 2018-10-18 DIAGNOSIS — O24415 Gestational diabetes mellitus in pregnancy, controlled by oral hypoglycemic drugs: Secondary | ICD-10-CM | POA: Diagnosis present

## 2018-10-18 DIAGNOSIS — O0990 Supervision of high risk pregnancy, unspecified, unspecified trimester: Secondary | ICD-10-CM

## 2018-10-18 DIAGNOSIS — O09529 Supervision of elderly multigravida, unspecified trimester: Secondary | ICD-10-CM | POA: Diagnosis present

## 2018-10-18 DIAGNOSIS — Z3A34 34 weeks gestation of pregnancy: Secondary | ICD-10-CM

## 2018-10-18 NOTE — Progress Notes (Signed)
Pt here for Flu Shot & Urine Protein test.

## 2018-10-19 ENCOUNTER — Encounter: Payer: Self-pay | Admitting: *Deleted

## 2018-10-19 LAB — PROTEIN / CREATININE RATIO, URINE
Creatinine, Urine: 186.7 mg/dL
Protein, Ur: 30.5 mg/dL
Protein/Creat Ratio: 163 mg/g creat (ref 0–200)

## 2018-10-23 ENCOUNTER — Telehealth (INDEPENDENT_AMBULATORY_CARE_PROVIDER_SITE_OTHER): Payer: No Typology Code available for payment source | Admitting: Obstetrics and Gynecology

## 2018-10-23 ENCOUNTER — Encounter: Payer: Self-pay | Admitting: Obstetrics and Gynecology

## 2018-10-23 ENCOUNTER — Other Ambulatory Visit: Payer: Self-pay

## 2018-10-23 DIAGNOSIS — O0990 Supervision of high risk pregnancy, unspecified, unspecified trimester: Secondary | ICD-10-CM

## 2018-10-23 DIAGNOSIS — O0993 Supervision of high risk pregnancy, unspecified, third trimester: Secondary | ICD-10-CM

## 2018-10-23 DIAGNOSIS — O10019 Pre-existing essential hypertension complicating pregnancy, unspecified trimester: Secondary | ICD-10-CM

## 2018-10-23 DIAGNOSIS — O99213 Obesity complicating pregnancy, third trimester: Secondary | ICD-10-CM

## 2018-10-23 DIAGNOSIS — O24415 Gestational diabetes mellitus in pregnancy, controlled by oral hypoglycemic drugs: Secondary | ICD-10-CM

## 2018-10-23 DIAGNOSIS — O99210 Obesity complicating pregnancy, unspecified trimester: Secondary | ICD-10-CM

## 2018-10-23 DIAGNOSIS — O10013 Pre-existing essential hypertension complicating pregnancy, third trimester: Secondary | ICD-10-CM

## 2018-10-23 DIAGNOSIS — Z3009 Encounter for other general counseling and advice on contraception: Secondary | ICD-10-CM | POA: Insufficient documentation

## 2018-10-23 DIAGNOSIS — O09523 Supervision of elderly multigravida, third trimester: Secondary | ICD-10-CM

## 2018-10-23 DIAGNOSIS — Z3A35 35 weeks gestation of pregnancy: Secondary | ICD-10-CM

## 2018-10-23 NOTE — Progress Notes (Signed)
TELEHEALTH OBSTETRICS PRENATAL VIRTUAL VIDEO VISIT ENCOUNTER NOTE  Provider location: [facility] [employer] at [facility]   I connected with Casey Mcmahon on 10/23/18 at  4:15 PM EDT by MyChart Video Encounter at home and verified that I am speaking with the correct person using two identifiers.   I discussed the limitations, risks, security and privacy concerns of performing an evaluation and management service virtually and the availability of in person appointments. I also discussed with the patient that there may be a patient responsible charge related to this service. The patient expressed understanding and agreed to proceed. Subjective:  Casey Mcmahon is a 33 y.o. [phone] at [redacted]w[redacted]d being seen today for ongoing prenatal care.  She is currently monitored for the following issues for this high-risk pregnancy and has Obesity; High blood pressure; PCOS (polycystic ovarian syndrome); Supervision of high risk pregnancy, antepartum; Hypertension in pregnancy, antepartum; GERD (gastroesophageal reflux disease); Asthma, mild intermittent; Migraines; AMA (advanced maternal age) multigravida 35+; GDM (gestational diabetes mellitus); Obesity affecting pregnancy, antepartum; and Unwanted fertility on their problem list.  Patient reports no complaints.  Contractions: Irritability. Vag. Bleeding: None.  Movement: Present. Denies any leaking of fluid.   The following portions of the patient's history were reviewed and updated as appropriate: allergies, current medications, past family history, past medical history, past social history, past surgical history and problem list.   Objective:  There were no vitals filed for this visit.  Fetal Status:     Movement: Present     General:  Alert, oriented and cooperative. Patient is in no acute distress.  Respiratory: Normal respiratory effort, no problems with respiration noted  Mental Status: Normal mood and affect. Normal behavior. Normal judgment  and thought content.  Rest of physical exam deferred due to type of encounter  Imaging: [place] Mfm Fetal Bpp Wo Non Stress  Result Date: 10/18/2018 ----------------------------------------------------------------------  OBSTETRICS REPORT                    (Corrected Final 10/18/2018 10:35 am) ---------------------------------------------------------------------- Patient Info  ID #:       [id number]                          D.O.B.:  [birth year] (33 yrs)  Name:       Casey Mcmahon                 Visit Date: 10/18/2018 09:13 am ---------------------------------------------------------------------- Performed By  Performed By:     Marcellina Millin          Ref. Address:     [address]                    [place]                                                             [address]  Attending:        Lin Landsman      [facility]                    MD  Fetal [facility]  Referred By:      [facility] ---------------------------------------------------------------------- Orders   #  Description                          Code         Ordered By   1  [place] MFM FETAL BPP WO NON              76819.01     CORENTHIAN      STRESS                                            BOOKER  ----------------------------------------------------------------------   #  Order #                    Accession #                 Episode #   1  [id number]                  [id number]                  [id number]  ---------------------------------------------------------------------- Indications   Gestational diabetes in pregnancy,             O24.415   controlled by oral hypoglycemic drugs   (metformin)   [redacted] weeks gestation of pregnancy                Z3A.34   Hypertension - Chronic/Pre-existing            O10.019   (labetalol)   Obesity complicating pregnancy, third          O85.213   trimester   Advanced maternal age multigravida 42+,        O25.523   third trimester   Low Risk NIPS   ---------------------------------------------------------------------- Vital Signs                                                 Height:        5'8" ---------------------------------------------------------------------- Fetal Evaluation  Num Of Fetuses:         1  Fetal Heart Rate(bpm):  129  Cardiac Activity:       Observed  Presentation:           Cephalic  Placenta:               Posterior  Amniotic Fluid  AFI FV:      Within normal limits  AFI Sum(cm)     %Tile       Largest Pocket(cm)  13.26           44          5.02  RUQ(cm)       RLQ(cm)       LUQ(cm)        LLQ(cm)  5.02          1.89          3.72           2.63 ---------------------------------------------------------------------- Biophysical Evaluation  Amniotic F.V:   Within normal limits       F. Tone:  Observed  F. Movement:    Observed                   Score:          8/8  F. Breathing:   Observed ---------------------------------------------------------------------- Gestational Age  Best:          34w 6d     Det. By:  U/S  (06/30/18)          EDD:   11/23/18 ---------------------------------------------------------------------- Anatomy  Stomach:               Appears normal, left   Bladder:                Appears normal                         sided ---------------------------------------------------------------------- Impression  Biophysical profile 8/8  A2GDM  Chronic hypertension on medication  AMA  Low risk NIPS ---------------------------------------------------------------------- Recommendations  Follow up growth in 3 weeks  Continue weekly BPP with NST. ----------------------------------------------------------------------                    Lin Landsmanorenthian Booker, MD Electronically Signed Corrected Final Report  10/18/2018 10:35 am ----------------------------------------------------------------------  [place] Mfm Fetal Bpp Wo Non Stress  Result Date: 10/13/2018 ----------------------------------------------------------------------   OBSTETRICS REPORT                    (Corrected Final 10/13/2018 01:37 pm) ---------------------------------------------------------------------- Patient Info  ID #:       [id number]                          D.O.B.:  [birth year] (33 yrs)  Name:       Casey Mcmahon                 Visit Date: 10/11/2018 08:49 am ---------------------------------------------------------------------- Performed By  Performed By:     Emeline DarlingKasie E Kiser BS,      Ref. Address:     [address]                    [place]                                                             [address]  Attending:        Ma RingsVictor Fang MD         [facility]                                                             [facility] Care  [facility]      [facility] ---------------------------------------------------------------------- Orders   #  Description                          Code         Ordered By   1  US MFM FETAL BPP WO NON  45409.81     CORENTHIAN      STRESS                                            BOOKER  ----------------------------------------------------------------------   #  Order #                    Accession #                 Episode #   1  [id number]                  [id number]                  [id number]  ---------------------------------------------------------------------- Indications   Gestational diabetes in pregnancy, diet        O24.410   controlled   Hypertension - Chronic/Pre-existing            O10.019   (labetalol)   Obesity complicating pregnancy, third          O99.213   trimester   Advanced maternal age multigravida 5+,        O34.523   third trimester   Low Risk NIPS   [redacted] weeks gestation of pregnancy                Z3A.33  ---------------------------------------------------------------------- Vital Signs  Weight (lb): 232                               Height:        5'8"  BMI:         35.27 ---------------------------------------------------------------------- Fetal Evaluation  Num Of Fetuses:          1  Fetal Heart Rate(bpm):  137  Cardiac Activity:       Observed  Presentation:           Cephalic  Placenta:               Posterior  P. Cord Insertion:      Previously Visualized  Amniotic Fluid  AFI FV:      Within normal limits  AFI Sum(cm)     %Tile       Largest Pocket(cm)  13.78           47          5.09  RUQ(cm)       RLQ(cm)       LUQ(cm)        LLQ(cm)  5.09          2.77          2.85           3.07 ---------------------------------------------------------------------- Biophysical Evaluation  Amniotic F.V:   Pocket => 2 cm two         F. Tone:        Observed                  planes  F. Movement:    Observed                   Score:          8/8  F. Breathing:   Observed ---------------------------------------------------------------------- Gestational Age  Best:          33w 6d  Det. By:  U/S  (06/30/18)          EDD:   11/23/18 ---------------------------------------------------------------------- Comments  A biophysical profile performed today was 8 out of 8.  There  is normal amniotic fluid noted on today's ultrasound exam.  Due to her history of chronic hypertension and gestational  diabetes, delivery will usually be recommended at around 37  to 39 weeks.  She should continue weekly fetal testing until delivery. ----------------------------------------------------------------------                        Ma Rings, MD Electronically Signed Corrected Final Report  10/13/2018 01:37 pm ----------------------------------------------------------------------  [place] Mfm Fetal Bpp Wo Non Stress  Result Date: 10/04/2018 ----------------------------------------------------------------------  OBSTETRICS REPORT                        (Signed Final 10/04/2018 10:32 am) ---------------------------------------------------------------------- Patient Info  ID #:       [id number]                          D.O.B.:  May 28, 1985 (33 yrs)  Name:       Casey Mcmahon                 Visit Date: 10/04/2018 08:55 am  ---------------------------------------------------------------------- Performed By  Performed By:     Earley Brooke     Ref. Address:      [address]                    [place], [place]                                                              [address]  Attending:        Noralee Space MD        [facility]                                                              [facility]  Referred By:      [facility] ---------------------------------------------------------------------- Orders   #  Description                          Code         Ordered By   1  [place] MFM FETAL BPP WO NON              76819.01     CORENTHIAN      STRESS                                            BOOKER   2  [place] MFM OB FOLLOW UP                  [id number]  Lin Landsman  ----------------------------------------------------------------------   #  Order #                    Accession #                 Episode #   1  [id number]                  [id number]                  [id number]   2  [id number]                  [id number]                  [id number]  ---------------------------------------------------------------------- Indications   Encounter for other antenatal screening        Z36.2   follow-up ( Low Risk NIPS)   [redacted] weeks gestation of pregnancy                Z3A.32   Obesity complicating pregnancy, second         O99.212   trimester (BMI 37.11)   Advanced maternal age multigravida 86+,        O53.522   second trimester   Hypertension - Chronic/Pre-existing            O10.019   (labetalol)   Gestational diabetes in pregnancy, diet        O24.410   controlled  ---------------------------------------------------------------------- Vital Signs                                                 Height:        5'8" ---------------------------------------------------------------------- Fetal Evaluation  Num Of Fetuses:          1  Fetal Heart Rate(bpm):   128  Cardiac  Activity:        Observed  Presentation:            Cephalic  Placenta:                Posterior  P. Cord Insertion:       Previously Visualized  Amniotic Fluid  AFI FV:      Within normal limits  AFI Sum(cm)     %Tile       Largest Pocket(cm)  15.65           56          5.56  RUQ(cm)       RLQ(cm)       LUQ(cm)        LLQ(cm)  2.08          4.76          5.56           3.25 ---------------------------------------------------------------------- Biophysical Evaluation  Amniotic F.V:   Within normal limits       F. Tone:         Observed  F. Movement:    Observed  Score:           8/8  F. Breathing:   Observed ---------------------------------------------------------------------- Biometry  BPD:      86.9  mm     G. Age:  35w 1d         94  %    CI:          78.2  %    70 - 86                                                          FL/HC:       19.5  %    19.9 - 21.5  HC:      310.9  mm     G. Age:  34w 5d         64  %    HC/AC:       1.14       0.96 - 1.11  AC:       273   mm     G. Age:  31w 3d         13  %    FL/BPD:      69.9  %    71 - 87  FL:       60.7  mm     G. Age:  31w 4d         10  %    FL/AC:       22.2  %    20 - 24  HUM:        55  mm     G. Age:  32w 0d         40  %  LV:        4.6  mm  Est. FW:    1894   gm     4 lb 3 oz     19  % ---------------------------------------------------------------------- Gestational Age  U/S Today:     33w 2d                                        EDD:   11/20/18  Best:          32w 6d     Det. By:  U/S  (06/30/18)          EDD:   11/23/18 ---------------------------------------------------------------------- Anatomy  Cranium:               Appears normal         Aortic Arch:            Previously seen  Cavum:                 Previously seen        Ductal Arch:            Previously seen  Ventricles:            Appears normal         Diaphragm:              Appears normal  Choroid Plexus:        Previously seen        Stomach:  Appears  normal, left                                                                        sided  Cerebellum:            Previously seen        Abdomen:                Previously seen  Posterior Fossa:       Previously seen        Abdominal Wall:         Previously seen  Nuchal Fold:           Not applicable (>20    Cord Vessels:           Previously seen                         wks GA)  Face:                  Orbits previously      Kidneys:                Appear normal                         seen  Lips:                  Previously seen        Bladder:                Appears normal  Thoracic:              Appears normal         Spine:                  Previously seen  Heart:                 Echogenic focus        Upper Extremities:      Previously seen                         in LV prev seen  RVOT:                  Previously seen        Lower Extremities:      Previously seen  LVOT:                  Previously seen  Other:  Female gender previously seen. Heels and 5th digit visualized          previously. Technically difficult due to maternal habitus and fetal          position. ---------------------------------------------------------------------- Cervix Uterus Adnexa  Cervix  Normal appearance by transabdominal scan.  Left Ovary  Previously seen.  Right Ovary  Previously seen ---------------------------------------------------------------------- Impression  Amniotic fluid is normal and good fetal activity is seen. Fetal  growth is appropriate for gestational age. Antenatal testing is  reassuring. BPP 8/8. ---------------------------------------------------------------------- Recommendations  -Continue weekly BPP till delivery. ----------------------------------------------------------------------  Noralee Spaceavi Shankar, MD Electronically Signed Final Report   10/04/2018 10:32 am ----------------------------------------------------------------------  [place] Mfm Ob Follow Up  Result Date:  10/04/2018 ----------------------------------------------------------------------  OBSTETRICS REPORT                        (Signed Final 10/04/2018 10:32 am) ---------------------------------------------------------------------- Patient Info  ID #:       [id number]                          D.O.B.:  [birth year] (33 yrs)  Name:       Casey Mcmahon                 Visit Date: 10/04/2018 08:55 am ---------------------------------------------------------------------- Performed By  Performed By:     Earley BrookeNicole S Dalrymple     Ref. Address:      [address]                    [place], [address]                                                              [address]  Attending:        Noralee Spaceavi Shankar MD        [facility]  [facility] ---------------------------------------------------------------------- Orders   #  Description                          Code         Ordered By   1  US MFM FETAL BPP WO NON              76819.01     CORENTHIAN      STRESS                                            BOOKER   2  US MFM OB FOLLOW UP                  [id number]     Lin LandsmanORENTHIAN                                                        BOOKER  ----------------------------------------------------------------------   #  Order #                    Accession #  Episode #   1  [id number]                  [id number]                  [id number]   2  [id number]                  [id number]                  [id number]  ---------------------------------------------------------------------- Indications   Encounter for other antenatal screening        Z36.2   follow-up ( Low Risk NIPS)   [redacted] weeks gestation of pregnancy                Z3A.32   Obesity complicating pregnancy, second         O99.212   trimester (BMI 37.11)   Advanced maternal age multigravida 29+,        O19.522   second trimester   Hypertension - Chronic/Pre-existing             O10.019   (labetalol)   Gestational diabetes in pregnancy, diet        O24.410   controlled  ---------------------------------------------------------------------- Vital Signs                                                 Height:        5'8" ---------------------------------------------------------------------- Fetal Evaluation  Num Of Fetuses:          1  Fetal Heart Rate(bpm):   128  Cardiac Activity:        Observed  Presentation:            Cephalic  Placenta:                Posterior  P. Cord Insertion:       Previously Visualized  Amniotic Fluid  AFI FV:      Within normal limits  AFI Sum(cm)     %Tile       Largest Pocket(cm)  15.65           56          5.56  RUQ(cm)       RLQ(cm)       LUQ(cm)        LLQ(cm)  2.08          4.76          5.56           3.25 ---------------------------------------------------------------------- Biophysical Evaluation  Amniotic F.V:   Within normal limits       F. Tone:         Observed  F. Movement:    Observed                   Score:           8/8  F. Breathing:   Observed ---------------------------------------------------------------------- Biometry  BPD:      86.9  mm     G. Age:  35w 1d         94  %    CI:          78.2  %    70 - 86  FL/HC:       19.5  %    19.9 - 21.5  HC:      310.9  mm     G. Age:  34w 5d         64  %    HC/AC:       1.14       0.96 - 1.11  AC:       273   mm     G. Age:  31w 3d         13  %    FL/BPD:      69.9  %    71 - 87  FL:       60.7  mm     G. Age:  31w 4d         10  %    FL/AC:       22.2  %    20 - 24  HUM:        55  mm     G. Age:  32w 0d         40  %  LV:        4.6  mm  Est. FW:    1894   gm     4 lb 3 oz     19  % ---------------------------------------------------------------------- Gestational Age  U/S Today:     33w 2d                                        EDD:   11/20/18  Best:          32w 6d     Det. By:  U/S  (06/30/18)          EDD:   11/23/18  ---------------------------------------------------------------------- Anatomy  Cranium:               Appears normal         Aortic Arch:            Previously seen  Cavum:                 Previously seen        Ductal Arch:            Previously seen  Ventricles:            Appears normal         Diaphragm:              Appears normal  Choroid Plexus:        Previously seen        Stomach:                Appears normal, left                                                                        sided  Cerebellum:            Previously seen        Abdomen:                Previously  seen  Posterior Fossa:       Previously seen        Abdominal Wall:         Previously seen  Nuchal Fold:           Not applicable (>20    Cord Vessels:           Previously seen                         wks GA)  Face:                  Orbits previously      Kidneys:                Appear normal                         seen  Lips:                  Previously seen        Bladder:                Appears normal  Thoracic:              Appears normal         Spine:                  Previously seen  Heart:                 Echogenic focus        Upper Extremities:      Previously seen                         in LV prev seen  RVOT:                  Previously seen        Lower Extremities:      Previously seen  LVOT:                  Previously seen  Other:  Female gender previously seen. Heels and 5th digit visualized          previously. Technically difficult due to maternal habitus and fetal          position. ---------------------------------------------------------------------- Cervix Uterus Adnexa  Cervix  Normal appearance by transabdominal scan.  Left Ovary  Previously seen.  Right Ovary  Previously seen ---------------------------------------------------------------------- Impression  Amniotic fluid is normal and good fetal activity is seen. Fetal  growth is appropriate for gestational age. Antenatal testing is  reassuring. BPP 8/8.  ---------------------------------------------------------------------- Recommendations  -Continue weekly BPP till delivery. ----------------------------------------------------------------------                  Noralee Space, MD Electronically Signed Final Report   10/04/2018 10:32 am ----------------------------------------------------------------------   Assessment and Plan:  Pregnancy: [id number] at [redacted]w[redacted]d 1. Obesity affecting pregnancy, antepartum   2. Gestational diabetes mellitus (GDM) in third trimester controlled on oral hypoglycemic drug CBG's in goal range except for diet choices Continue with antenatal testing 3. Multigravida of advanced maternal age in third trimester   4. Supervision of high risk pregnancy, antepartum Stable  5. Pre-existing essential hypertension during pregnancy, antepartum No S/sx of PEC Continue with current management and weekly testing  6. Unwanted fertility   Preterm labor symptoms and general obstetric precautions including but not  limited to vaginal bleeding, contractions, leaking of fluid and fetal movement were reviewed in detail with the patient. I discussed the assessment and treatment plan with the patient. The patient was provided an opportunity to ask questions and all were answered. The patient agreed with the plan and demonstrated an understanding of the instructions. The patient was advised to call back or seek an in-person office evaluation/go to [facility] at [facility] for any urgent or concerning symptoms. Please refer to After Visit Summary for other counseling recommendations.   I provided 8 minutes of face-to-face time during this encounter.  Return in about 1 week (around 10/30/2018) for OB visit, face to face.  Future Appointments  Date Time Provider Department Center  10/25/2018  8:15 AM WH-MFC NURSE WH-MFC MFC-US  10/25/2018  8:15 AM WH-MFC [place] 4 WH-MFCUS MFC-US  11/01/2018  8:15 AM WH-MFC NURSE WH-MFC MFC-US  11/01/2018   8:15 AM WH-MFC [place] 4 WH-MFCUS MFC-US    Hermina Staggers, MD [facility] [employer], [facility]

## 2018-10-23 NOTE — Progress Notes (Signed)
BR X Glucose Readings:

## 2018-10-25 ENCOUNTER — Ambulatory Visit (HOSPITAL_COMMUNITY)
Admission: RE | Admit: 2018-10-25 | Discharge: 2018-10-25 | Disposition: A | Discharge status: Home / Self Care | Payer: No Typology Code available for payment source | Source: Ambulatory Visit | Attending: Obstetrics and Gynecology | Admitting: Obstetrics and Gynecology

## 2018-10-25 ENCOUNTER — Encounter (HOSPITAL_COMMUNITY): Payer: Self-pay

## 2018-10-25 ENCOUNTER — Other Ambulatory Visit: Payer: Self-pay

## 2018-10-25 ENCOUNTER — Ambulatory Visit (HOSPITAL_COMMUNITY): Payer: No Typology Code available for payment source | Admitting: *Deleted

## 2018-10-25 DIAGNOSIS — O99213 Obesity complicating pregnancy, third trimester: Secondary | ICD-10-CM

## 2018-10-25 DIAGNOSIS — O09529 Supervision of elderly multigravida, unspecified trimester: Secondary | ICD-10-CM | POA: Diagnosis present

## 2018-10-25 DIAGNOSIS — O24415 Gestational diabetes mellitus in pregnancy, controlled by oral hypoglycemic drugs: Secondary | ICD-10-CM | POA: Diagnosis present

## 2018-10-25 DIAGNOSIS — O99210 Obesity complicating pregnancy, unspecified trimester: Secondary | ICD-10-CM | POA: Insufficient documentation

## 2018-10-25 DIAGNOSIS — Z3A35 35 weeks gestation of pregnancy: Secondary | ICD-10-CM

## 2018-10-25 DIAGNOSIS — O09523 Supervision of elderly multigravida, third trimester: Secondary | ICD-10-CM | POA: Diagnosis not present

## 2018-10-25 DIAGNOSIS — O10013 Pre-existing essential hypertension complicating pregnancy, third trimester: Secondary | ICD-10-CM | POA: Diagnosis not present

## 2018-10-30 ENCOUNTER — Other Ambulatory Visit: Payer: Self-pay | Admitting: Family Medicine

## 2018-10-30 ENCOUNTER — Ambulatory Visit (INDEPENDENT_AMBULATORY_CARE_PROVIDER_SITE_OTHER): Payer: No Typology Code available for payment source | Admitting: Family Medicine

## 2018-10-30 ENCOUNTER — Other Ambulatory Visit: Payer: Self-pay

## 2018-10-30 VITALS — BP 129/84 | HR 103 | Temp 98.0°F | Wt 232.9 lb

## 2018-10-30 DIAGNOSIS — Z113 Encounter for screening for infections with a predominantly sexual mode of transmission: Secondary | ICD-10-CM | POA: Diagnosis not present

## 2018-10-30 DIAGNOSIS — Z3A36 36 weeks gestation of pregnancy: Secondary | ICD-10-CM

## 2018-10-30 DIAGNOSIS — O24415 Gestational diabetes mellitus in pregnancy, controlled by oral hypoglycemic drugs: Secondary | ICD-10-CM

## 2018-10-30 DIAGNOSIS — O09523 Supervision of elderly multigravida, third trimester: Secondary | ICD-10-CM

## 2018-10-30 DIAGNOSIS — O10013 Pre-existing essential hypertension complicating pregnancy, third trimester: Secondary | ICD-10-CM

## 2018-10-30 DIAGNOSIS — O99613 Diseases of the digestive system complicating pregnancy, third trimester: Secondary | ICD-10-CM

## 2018-10-30 DIAGNOSIS — J4520 Mild intermittent asthma, uncomplicated: Secondary | ICD-10-CM

## 2018-10-30 DIAGNOSIS — O0990 Supervision of high risk pregnancy, unspecified, unspecified trimester: Secondary | ICD-10-CM

## 2018-10-30 DIAGNOSIS — O0993 Supervision of high risk pregnancy, unspecified, third trimester: Secondary | ICD-10-CM

## 2018-10-30 DIAGNOSIS — K219 Gastro-esophageal reflux disease without esophagitis: Secondary | ICD-10-CM

## 2018-10-30 DIAGNOSIS — O10019 Pre-existing essential hypertension complicating pregnancy, unspecified trimester: Secondary | ICD-10-CM

## 2018-10-30 DIAGNOSIS — O99513 Diseases of the respiratory system complicating pregnancy, third trimester: Secondary | ICD-10-CM

## 2018-10-30 NOTE — Progress Notes (Signed)
   PRENATAL VISIT NOTE  Subjective:  Casey Mcmahon is a 33 y.o. [phone] at [redacted]w[redacted]d being seen today for ongoing prenatal care.  She is currently monitored for the following issues for this high-risk pregnancy and has Obesity; High blood pressure; PCOS (polycystic ovarian syndrome); Supervision of high risk pregnancy, antepartum; Hypertension in pregnancy, antepartum; GERD (gastroesophageal reflux disease); Asthma, mild intermittent; Migraines; AMA (advanced maternal age) multigravida 35+; GDM (gestational diabetes mellitus); Obesity affecting pregnancy, antepartum; and Unwanted fertility on their problem list.  Patient reports no complaints.  Contractions: Irritability. Vag. Bleeding: None.  Movement: Present. Denies leaking of fluid.   The following portions of the patient's history were reviewed and updated as appropriate: allergies, current medications, past family history, past medical history, past social history, past surgical history and problem list.   Objective:   Vitals:   10/30/18 0933  BP: 129/84  Pulse: (!) 103  Temp: 98 F (36.7 C)  Weight: 232 lb 14.4 oz (105.6 kg)    Fetal Status: Fetal Heart Rate (bpm): 127 Fundal Height: 36 cm Movement: Present  Presentation: Vertex  General:  Alert, oriented and cooperative. Patient is in no acute distress.  Skin: Skin is warm and dry. No rash noted.   Cardiovascular: Normal heart rate noted  Respiratory: Normal respiratory effort, no problems with respiration noted  Abdomen: Soft, gravid, appropriate for gestational age.  Pain/Pressure: Absent     Pelvic: Cervical exam performed Dilation: 1 Effacement (%): 50 Station: Ballotable  Extremities: Normal range of motion.  Edema: None  Mental Status: Normal mood and affect. Normal behavior. Normal judgment and thought content.   Assessment and Plan:  Pregnancy: [id number] at [redacted]w[redacted]d 1. Supervision of high risk pregnancy, antepartum FHT and FH normal - GC/Chlamydia probe amp ([facility])not at [facility] - Culture, beta strep (group b only)  2. Gestational diabetes mellitus (GDM) in third trimester controlled on oral hypoglycemic drug Blood sugars checked - sporatic elevated CBGs.  3. Pre-existing essential hypertension during pregnancy, antepartum BP normal Has [place] on Wednesday  4. Multigravida of advanced maternal age in third trimester  5. Gastroesophageal reflux disease without esophagitis  6. Mild intermittent asthma without complication controlled  Preterm labor symptoms and general obstetric precautions including but not limited to vaginal bleeding, contractions, leaking of fluid and fetal movement were reviewed in detail with the patient. Please refer to After Visit Summary for other counseling recommendations.   Return in about 1 week (around 11/06/2018) for HR OB f/u.  Future Appointments  Date Time Provider [place]  11/01/2018  8:15 AM Borden Noonday M[facility]-US  11/01/2018  8:15 AM [place] 4 [facility]-MFCUS M[facility]-US    Truett Mainland, DO

## 2018-10-31 LAB — CERVICOVAGINAL ANCILLARY ONLY
Chlamydia: NEGATIVE
Neisseria Gonorrhea: NEGATIVE

## 2018-11-01 ENCOUNTER — Ambulatory Visit (HOSPITAL_COMMUNITY): Payer: No Typology Code available for payment source | Admitting: *Deleted

## 2018-11-01 ENCOUNTER — Other Ambulatory Visit (HOSPITAL_COMMUNITY): Payer: Self-pay | Admitting: *Deleted

## 2018-11-01 ENCOUNTER — Encounter (HOSPITAL_COMMUNITY): Payer: Self-pay

## 2018-11-01 ENCOUNTER — Ambulatory Visit (HOSPITAL_COMMUNITY)
Admission: RE | Admit: 2018-11-01 | Discharge: 2018-11-01 | Disposition: A | Discharge status: Home / Self Care | Payer: No Typology Code available for payment source | Source: Ambulatory Visit | Attending: Obstetrics and Gynecology | Admitting: Obstetrics and Gynecology

## 2018-11-01 ENCOUNTER — Other Ambulatory Visit: Payer: Self-pay

## 2018-11-01 DIAGNOSIS — O10013 Pre-existing essential hypertension complicating pregnancy, third trimester: Secondary | ICD-10-CM | POA: Diagnosis not present

## 2018-11-01 DIAGNOSIS — O09523 Supervision of elderly multigravida, third trimester: Secondary | ICD-10-CM | POA: Insufficient documentation

## 2018-11-01 DIAGNOSIS — Z3A36 36 weeks gestation of pregnancy: Secondary | ICD-10-CM

## 2018-11-01 DIAGNOSIS — O09529 Supervision of elderly multigravida, unspecified trimester: Secondary | ICD-10-CM | POA: Diagnosis not present

## 2018-11-01 DIAGNOSIS — O99210 Obesity complicating pregnancy, unspecified trimester: Secondary | ICD-10-CM | POA: Diagnosis present

## 2018-11-01 DIAGNOSIS — O99213 Obesity complicating pregnancy, third trimester: Secondary | ICD-10-CM

## 2018-11-01 DIAGNOSIS — O24415 Gestational diabetes mellitus in pregnancy, controlled by oral hypoglycemic drugs: Secondary | ICD-10-CM | POA: Insufficient documentation

## 2018-11-01 DIAGNOSIS — Z362 Encounter for other antenatal screening follow-up: Secondary | ICD-10-CM | POA: Diagnosis not present

## 2018-11-01 DIAGNOSIS — O10919 Unspecified pre-existing hypertension complicating pregnancy, unspecified trimester: Secondary | ICD-10-CM

## 2018-11-02 LAB — CULTURE, BETA STREP (GROUP B ONLY): Strep Gp B Culture: NEGATIVE

## 2018-11-09 ENCOUNTER — Ambulatory Visit (HOSPITAL_COMMUNITY): Payer: No Typology Code available for payment source | Admitting: *Deleted

## 2018-11-09 ENCOUNTER — Encounter (HOSPITAL_COMMUNITY): Payer: Self-pay

## 2018-11-09 ENCOUNTER — Other Ambulatory Visit: Payer: Self-pay

## 2018-11-09 ENCOUNTER — Ambulatory Visit (HOSPITAL_COMMUNITY)
Admission: RE | Admit: 2018-11-09 | Discharge: 2018-11-09 | Disposition: A | Discharge status: Home / Self Care | Payer: No Typology Code available for payment source | Source: Ambulatory Visit | Attending: Maternal & Fetal Medicine | Admitting: Maternal & Fetal Medicine

## 2018-11-09 ENCOUNTER — Ambulatory Visit (INDEPENDENT_AMBULATORY_CARE_PROVIDER_SITE_OTHER): Payer: No Typology Code available for payment source | Admitting: Obstetrics & Gynecology

## 2018-11-09 DIAGNOSIS — O99213 Obesity complicating pregnancy, third trimester: Secondary | ICD-10-CM | POA: Diagnosis not present

## 2018-11-09 DIAGNOSIS — Z3A37 37 weeks gestation of pregnancy: Secondary | ICD-10-CM

## 2018-11-09 DIAGNOSIS — O24415 Gestational diabetes mellitus in pregnancy, controlled by oral hypoglycemic drugs: Secondary | ICD-10-CM | POA: Insufficient documentation

## 2018-11-09 DIAGNOSIS — O10013 Pre-existing essential hypertension complicating pregnancy, third trimester: Secondary | ICD-10-CM | POA: Diagnosis not present

## 2018-11-09 DIAGNOSIS — O0990 Supervision of high risk pregnancy, unspecified, unspecified trimester: Secondary | ICD-10-CM

## 2018-11-09 DIAGNOSIS — O09523 Supervision of elderly multigravida, third trimester: Secondary | ICD-10-CM | POA: Diagnosis present

## 2018-11-09 DIAGNOSIS — O99210 Obesity complicating pregnancy, unspecified trimester: Secondary | ICD-10-CM | POA: Insufficient documentation

## 2018-11-09 DIAGNOSIS — O10919 Unspecified pre-existing hypertension complicating pregnancy, unspecified trimester: Secondary | ICD-10-CM | POA: Insufficient documentation

## 2018-11-09 DIAGNOSIS — O0993 Supervision of high risk pregnancy, unspecified, third trimester: Secondary | ICD-10-CM

## 2018-11-09 NOTE — Progress Notes (Signed)
   PRENATAL VISIT NOTE  Subjective:  Casey Mcmahon is a 33 y.o. [phone] at [redacted]w[redacted]d being seen today for ongoing prenatal care.  She is currently monitored for the following issues for this high-risk pregnancy and has Obesity; High blood pressure; PCOS (polycystic ovarian syndrome); Supervision of high risk pregnancy, antepartum; Hypertension in pregnancy, antepartum; GERD (gastroesophageal reflux disease); Asthma, mild intermittent; Migraines; AMA (advanced maternal age) multigravida 35+; GDM (gestational diabetes mellitus); Obesity affecting pregnancy, antepartum; and Unwanted fertility on their problem list.  Patient reports no complaints.  Contractions: Irritability. Vag. Bleeding: None.  Movement: Present. Denies leaking of fluid.   The following portions of the patient's history were reviewed and updated as appropriate: allergies, current medications, past family history, past medical history, past social history, past surgical history and problem list.   Objective:   Vitals:   11/09/18 1529  BP: 119/73  Pulse: 94  Temp: 98.1 F (36.7 C)  Weight: 236 lb 1.6 oz (107.1 kg)    Fetal Status: Fetal Heart Rate (bpm): 144   Movement: Present     General:  Alert, oriented and cooperative. Patient is in no acute distress.  Skin: Skin is warm and dry. No rash noted.   Cardiovascular: Normal heart rate noted  Respiratory: Normal respiratory effort, no problems with respiration noted  Abdomen: Soft, gravid, appropriate for gestational age.  Pain/Pressure: Present     Pelvic: Cervical exam deferred        Extremities: Normal range of motion.  Edema: None  Mental Status: Normal mood and affect. Normal behavior. Normal judgment and thought content.   Assessment and Plan:  Pregnancy: [id number] at [redacted]w[redacted]d  1.   GDM--some high fasting but most are nml.  Continue Metformin.  Growth [place] in Sept. 70%.  Term labor symptoms and general obstetric precautions including but not limited to vaginal  bleeding, contractions, leaking of fluid and fetal movement were reviewed in detail with the patient. Please refer to After Visit Summary for other counseling recommendations.   2.  HTN Normal BPs today and on Babyscripts    Future Appointments  Date Time Provider [place]  11/20/2018  7:15 AM [facility] SCHED ROOM [facility]-INDC None    Silas Sacramento, MD

## 2018-11-10 ENCOUNTER — Other Ambulatory Visit (HOSPITAL_COMMUNITY): Payer: Self-pay | Admitting: *Deleted

## 2018-11-10 DIAGNOSIS — O24415 Gestational diabetes mellitus in pregnancy, controlled by oral hypoglycemic drugs: Secondary | ICD-10-CM

## 2018-11-14 ENCOUNTER — Encounter (HOSPITAL_COMMUNITY): Payer: Self-pay | Admitting: *Deleted

## 2018-11-14 ENCOUNTER — Telehealth (HOSPITAL_COMMUNITY): Payer: Self-pay | Admitting: *Deleted

## 2018-11-14 NOTE — Telephone Encounter (Signed)
Preadmission screen  

## 2018-11-15 ENCOUNTER — Ambulatory Visit (HOSPITAL_COMMUNITY): Payer: No Typology Code available for payment source | Admitting: *Deleted

## 2018-11-15 ENCOUNTER — Ambulatory Visit (HOSPITAL_COMMUNITY)
Admission: RE | Admit: 2018-11-15 | Discharge: 2018-11-15 | Disposition: A | Discharge status: Home / Self Care | Payer: No Typology Code available for payment source | Source: Ambulatory Visit | Attending: Obstetrics and Gynecology | Admitting: Obstetrics and Gynecology

## 2018-11-15 ENCOUNTER — Other Ambulatory Visit: Payer: Self-pay

## 2018-11-15 ENCOUNTER — Encounter (HOSPITAL_COMMUNITY): Payer: Self-pay

## 2018-11-15 ENCOUNTER — Inpatient Hospital Stay (HOSPITAL_COMMUNITY)
Admission: AD | Admit: 2018-11-15 | Discharge: 2018-11-18 | DRG: 798 | Disposition: A | Discharge status: Home / Self Care | Payer: No Typology Code available for payment source | Attending: Family Medicine | Admitting: Family Medicine

## 2018-11-15 ENCOUNTER — Other Ambulatory Visit (HOSPITAL_COMMUNITY): Payer: Self-pay | Admitting: Obstetrics and Gynecology

## 2018-11-15 DIAGNOSIS — Z3009 Encounter for other general counseling and advice on contraception: Secondary | ICD-10-CM | POA: Diagnosis present

## 2018-11-15 DIAGNOSIS — O24415 Gestational diabetes mellitus in pregnancy, controlled by oral hypoglycemic drugs: Secondary | ICD-10-CM | POA: Insufficient documentation

## 2018-11-15 DIAGNOSIS — O10013 Pre-existing essential hypertension complicating pregnancy, third trimester: Secondary | ICD-10-CM

## 2018-11-15 DIAGNOSIS — Z302 Encounter for sterilization: Secondary | ICD-10-CM | POA: Diagnosis not present

## 2018-11-15 DIAGNOSIS — O09523 Supervision of elderly multigravida, third trimester: Secondary | ICD-10-CM

## 2018-11-15 DIAGNOSIS — O10919 Unspecified pre-existing hypertension complicating pregnancy, unspecified trimester: Secondary | ICD-10-CM | POA: Diagnosis present

## 2018-11-15 DIAGNOSIS — Z3A38 38 weeks gestation of pregnancy: Secondary | ICD-10-CM | POA: Diagnosis not present

## 2018-11-15 DIAGNOSIS — O99210 Obesity complicating pregnancy, unspecified trimester: Secondary | ICD-10-CM | POA: Insufficient documentation

## 2018-11-15 DIAGNOSIS — J4540 Moderate persistent asthma, uncomplicated: Secondary | ICD-10-CM | POA: Diagnosis present

## 2018-11-15 DIAGNOSIS — O1002 Pre-existing essential hypertension complicating childbirth: Principal | ICD-10-CM | POA: Diagnosis present

## 2018-11-15 DIAGNOSIS — O24425 Gestational diabetes mellitus in childbirth, controlled by oral hypoglycemic drugs: Secondary | ICD-10-CM | POA: Diagnosis present

## 2018-11-15 DIAGNOSIS — Z20828 Contact with and (suspected) exposure to other viral communicable diseases: Secondary | ICD-10-CM | POA: Diagnosis present

## 2018-11-15 DIAGNOSIS — O368130 Decreased fetal movements, third trimester, not applicable or unspecified: Secondary | ICD-10-CM | POA: Diagnosis not present

## 2018-11-15 DIAGNOSIS — E669 Obesity, unspecified: Secondary | ICD-10-CM | POA: Diagnosis present

## 2018-11-15 DIAGNOSIS — O9952 Diseases of the respiratory system complicating childbirth: Secondary | ICD-10-CM | POA: Diagnosis present

## 2018-11-15 DIAGNOSIS — O99214 Obesity complicating childbirth: Secondary | ICD-10-CM | POA: Diagnosis present

## 2018-11-15 DIAGNOSIS — O99213 Obesity complicating pregnancy, third trimester: Secondary | ICD-10-CM | POA: Diagnosis not present

## 2018-11-15 DIAGNOSIS — O169 Unspecified maternal hypertension, unspecified trimester: Secondary | ICD-10-CM | POA: Diagnosis present

## 2018-11-15 DIAGNOSIS — O09529 Supervision of elderly multigravida, unspecified trimester: Secondary | ICD-10-CM

## 2018-11-15 DIAGNOSIS — O24419 Gestational diabetes mellitus in pregnancy, unspecified control: Secondary | ICD-10-CM | POA: Diagnosis present

## 2018-11-15 DIAGNOSIS — J4520 Mild intermittent asthma, uncomplicated: Secondary | ICD-10-CM | POA: Diagnosis present

## 2018-11-15 LAB — CBC
HCT: 34 % — ABNORMAL LOW (ref 36.0–46.0)
Hemoglobin: 11.6 g/dL — ABNORMAL LOW (ref 12.0–15.0)
MCH: 32.1 pg (ref 26.0–34.0)
MCHC: 34.1 g/dL (ref 30.0–36.0)
MCV: 94.2 fL (ref 80.0–100.0)
Platelets: 188 10*3/uL (ref 150–400)
RBC: 3.61 MIL/uL — ABNORMAL LOW (ref 3.87–5.11)
RDW: 13.9 % (ref 11.5–15.5)
WBC: 9.1 10*3/uL (ref 4.0–10.5)
nRBC: 0 % (ref 0.0–0.2)

## 2018-11-15 LAB — SARS CORONAVIRUS 2 BY RT PCR (HOSPITAL ORDER, PERFORMED IN ~~LOC~~ HOSPITAL LAB): SARS Coronavirus 2: NEGATIVE

## 2018-11-15 LAB — GLUCOSE, CAPILLARY
Glucose-Capillary: 81 mg/dL (ref 70–99)
Glucose-Capillary: 93 mg/dL (ref 70–99)

## 2018-11-15 LAB — TYPE AND SCREEN
ABO/RH(D): O POS
Antibody Screen: NEGATIVE

## 2018-11-15 LAB — ABO/RH: ABO/RH(D): O POS

## 2018-11-15 MED ORDER — ACETAMINOPHEN 325 MG PO TABS: 650.0000 mg | ORAL_TABLET | ORAL | Status: DC | PRN

## 2018-11-15 MED ORDER — MISOPROSTOL 25 MCG QUARTER TABLET: 25.0000 ug | ORAL_TABLET | ORAL | Status: DC | PRN

## 2018-11-15 MED ORDER — METFORMIN HCL 500 MG PO TABS: 500.0000 mg | ORAL_TABLET | Freq: Every day | ORAL | Status: DC

## 2018-11-15 MED ORDER — LACTATED RINGERS IV SOLN: 500.0000 mL | INTRAVENOUS | Status: DC | PRN

## 2018-11-15 MED ORDER — METFORMIN HCL 500 MG PO TABS
500.0000 mg | ORAL_TABLET | Freq: Once | ORAL | Status: AC
  Administered 2018-11-15: 500 mg via ORAL
  Filled 2018-11-15: qty 1

## 2018-11-15 MED ORDER — LABETALOL HCL 200 MG PO TABS: 400.0000 mg | ORAL_TABLET | Freq: Two times a day (BID) | ORAL | Status: DC

## 2018-11-15 MED ORDER — OXYTOCIN 40 UNITS IN NORMAL SALINE INFUSION - SIMPLE MED
2.5000 [IU]/h | INTRAVENOUS | Status: DC
  Filled 2018-11-15: qty 1000

## 2018-11-15 MED ORDER — MISOPROSTOL 50MCG HALF TABLET
50.0000 ug | ORAL_TABLET | Freq: Once | ORAL | Status: AC
  Administered 2018-11-15: 50 ug via BUCCAL
  Filled 2018-11-15: qty 1

## 2018-11-15 MED ORDER — LACTATED RINGERS IV SOLN
INTRAVENOUS | Status: DC
  Administered 2018-11-16 (×2): via INTRAVENOUS

## 2018-11-15 MED ORDER — LABETALOL HCL 200 MG PO TABS
400.0000 mg | ORAL_TABLET | Freq: Every day | ORAL | Status: DC
  Administered 2018-11-15: 20:00:00 400 mg via ORAL
  Filled 2018-11-15: qty 2

## 2018-11-15 MED ORDER — OXYTOCIN BOLUS FROM INFUSION
500.0000 mL | Freq: Once | INTRAVENOUS | Status: AC
  Administered 2018-11-16: 14:00:00 500 mL via INTRAVENOUS

## 2018-11-15 MED ORDER — SOD CITRATE-CITRIC ACID 500-334 MG/5ML PO SOLN
30.0000 mL | ORAL | Status: DC | PRN
  Administered 2018-11-16: 16:00:00 30 mL via ORAL
  Filled 2018-11-15: qty 30

## 2018-11-15 MED ORDER — ONDANSETRON HCL 4 MG/2ML IJ SOLN
4.0000 mg | Freq: Four times a day (QID) | INTRAMUSCULAR | Status: DC | PRN
  Administered 2018-11-16: 12:00:00 4 mg via INTRAVENOUS
  Filled 2018-11-15: qty 2

## 2018-11-15 MED ORDER — TERBUTALINE SULFATE 1 MG/ML IJ SOLN: 0.2500 mg | Freq: Once | INTRAMUSCULAR | Status: DC | PRN

## 2018-11-15 MED ORDER — LIDOCAINE HCL (PF) 1 % IJ SOLN: 30.0000 mL | INTRAMUSCULAR | Status: DC | PRN

## 2018-11-15 MED ORDER — MISOPROSTOL 50MCG HALF TABLET
50.0000 ug | ORAL_TABLET | Freq: Once | ORAL | Status: AC
  Administered 2018-11-15: 50 ug via ORAL
  Filled 2018-11-15: qty 1

## 2018-11-15 NOTE — Progress Notes (Signed)
Patient ID: DALIA JOLLIE, female   DOB: 1985-03-17, 33 y.o.   MRN: [id number]  S/p cytotec x 1 dose; feeling mild cramping  BP 112/77, 99 FHR 130s, +accels, no decels Ctx irreg, mild Cx per RN 2/70/-3  CBG 93  IUP@38 .2wks cHTN- Labetalol GDMA2- Metformin Cx unfavorable  Will give one more dose of buccal cytotec and then probably start Pitocin Anticipate vag del Plans ppBTL Will start pre-preg cHTN meds after delivery (lisinopril 20mg /HCTZ 12.5mg )  Myrtis Ser CNM 11/15/2018

## 2018-11-15 NOTE — H&P (Signed)
OBSTETRIC ADMISSION HISTORY AND PHYSICAL  Casey Mcmahon is a 33 y.o. female [phone] with IUP at [redacted]w[redacted]d by 8wk ultrasound presenting for induction of labor due to failed BPP (6/10), gDMA2, cHTN. She reports +FMs, No LOF, no VB, no blurry vision, headaches or peripheral edema, and RUQ pain.  She plans on breastfeeding. She request BTL for birth control. She received her prenatal care at [facility]   Dating: By 8week ultrasound --->  Estimated Date of Delivery: 11/27/18  Sono:  9/23  @[redacted]w[redacted]d , CWD, normal anatomy, cephalic presentation, posterior placental lie, 3226g, 73% EFW  Prenatal History/Complications: GDMA2 on Metformin cHTN on Labetalol  AMA  Past Medical History: Past Medical History:  Diagnosis Date  . Asthma    prn inhaler  . Asthma, moderate persistent 04/17/2018  . Derangement of posterior horn of medial meniscus 01/2014   left  . GERD (gastroesophageal reflux disease)    no current med.  . Gestational diabetes   . Hypertension    under control with med., has been on med. x 3 yr.  . Left ACL tear 01/2014  . Migraines    occasional  . Seasonal allergies    current nasal congestion (01/24/2014)  . Supervision of other normal pregnancy, antepartum 04/17/2018    Past Surgical History: Past Surgical History:  Procedure Laterality Date  . KNEE ARTHROSCOPY WITH ANTERIOR CRUCIATE LIGAMENT (ACL) REPAIR Left 01/28/2014   Procedure: LEFT KNEE ARTHROSCOPY WITH MEDIAL MENISCECTOMY AND ANTERIOR CRUCIATE LIGAMENT REPAIR;  Surgeon: 01/30/2014, MD;  Location: [facility];  Service: Orthopedics;  Laterality: Left;  . ORIF ANKLE FRACTURE Left 07/19/2013   Procedure: LEFT ANKLE FRACTURE OPEN TREATMENT BIMALLEOLAR ANKLE INCLUDES INTERNAL FIXATION, LEFT ANKLE FRACTURE OPEN TREATMENT DISTAL TIBIOFIBULAR fasciotomy INCLUDES INTERNAL FIXATION repai of synosmosis;  Surgeon: 09/18/2013, MD;  Location: [facility];  Service: Orthopedics;  Laterality: Left;     Obstetrical History: OB History    Gravida  4   Para  2   Term  2   Preterm  0   AB  1   Living  2     SAB  0   TAB  1   Ectopic  0   Multiple  0   Live Births  2           Social History: Social History   Socioeconomic History  . Marital status: Married    Spouse name: Not on file  . Number of children: Not on file  . Years of education: Not on file  . Highest education level: Not on file  Occupational History  . Not on file  Social Needs  . Financial resource strain: Not hard at all  . Food insecurity    Worry: Never true    Inability: Never true  . Transportation needs    Medical: No    Non-medical: No  Tobacco Use  . Smoking status: Never Smoker  . Smokeless tobacco: Never Used  Substance and Sexual Activity  . Alcohol use: Not Currently    Comment: socially  . Drug use: No  . Sexual activity: Yes    Birth control/protection: None  Lifestyle  . Physical activity    Days per week: Not on file    Minutes per session: Not on file  . Stress: Not on file  Relationships  . Social [address] on phone: Not on file    Gets together: Not on file    Attends religious  service: Not on file    Active member of club or organization: Not on file    Attends meetings of clubs or organizations: Not on file    Relationship status: Not on file  Other Topics Concern  . Not on file  Social History Narrative  . Not on file    Family History: Family History  Problem Relation Age of Onset  . Diabetes Maternal Grandfather   . Diabetes Mother   . Alcoholism Mother   . Diabetes Father   . Hypertension Father   . Epilepsy Daughter   . Alzheimer's disease Maternal Grandmother   . Glaucoma Maternal Grandmother     Allergies: No Known Allergies  Medications Prior to Admission  Medication Sig Dispense Refill Last Dose  . acetaminophen (TYLENOL) 500 MG tablet Take 1,000 mg by mouth every 6 (six) hours as needed for mild pain.     Marland Kitchen  aspirin EC 81 MG tablet Take 1 tablet (81 mg total) by mouth daily. Start on 05/15/2018 or later 90 tablet 3   . diphenhydrAMINE (BENADRYL) 25 MG tablet Take 25 mg by mouth every 6 (six) hours as needed.     . fluticasone (FLONASE) 50 MCG/ACT nasal spray Place 1 spray into both nostrils daily.      Marland Kitchen glucose blood test strip Test Blood Glucose 4 x a day. Fasting and 2 hours after breakfast, lunch and dinner. 100 each 12   . labetalol (NORMODYNE) 200 MG tablet Take 2 tablets (400 mg total) by mouth daily for 30 days. 120 tablet 2   . labetalol (NORMODYNE) 200 MG tablet TAKE 2 TABLETS(400 MG) BY MOUTH TWICE DAILY 120 tablet 2   . Lancets (ONETOUCH ULTRASOFT) lancets Use as instructed. Use with meter 4 times daily 100 each 12   . metFORMIN (GLUCOPHAGE) 500 MG tablet Take 1 tablet at bedtime. 60 tablet 6   . PRENATAL 27-1 MG TABS Take 1 tablet by mouth daily. 30 each 9   . promethazine (PHENERGAN) 12.5 MG tablet Take 1 tablet (12.5 mg total) by mouth every 6 (six) hours as needed for nausea or vomiting. 30 tablet 0      Review of Systems   All systems reviewed and negative except as stated in HPI  Blood pressure 137/84, pulse 95, temperature 98.3 F (36.8 C), temperature source Oral, resp. rate 18, weight 107.1 kg, last menstrual period 01/19/2018. General appearance: alert, cooperative and no distress Lungs: normal effort Heart: regular rate  Abdomen: soft, non-tender; bowel sounds normal Pelvic: gravid uterus Extremities: Homans sign is negative, no sign of DVT Presentation: cephalic Fetal monitoringBaseline: 145 bpm, moderate variability, reactive, pos accels, no decels Uterine activity: q63m    Prenatal labs: ABO, Rh: O/Positive/-- (03/09 1013) Antibody: Negative (03/09 1013) Rubella: 5.11 (03/09 1013)2 RPR: Non Reactive (07/13 0832)  HBsAg: Negative (03/09 1013)  HIV: Non Reactive (07/13 0832)  GBS: Negative/-- (09/21 1039)  2 hr Glucola positive  Genetic screening  Low risk  NIPS Anatomy US WNL  Prenatal Transfer Tool  Maternal Diabetes: Yes:  Diabetes Type:  Insulin/Medication controlled Genetic Screening: Normal Maternal Ultrasounds/Referrals: Normal Fetal Ultrasounds or other Referrals:  None Maternal Substance Abuse:  No Significant Maternal Medications:  None Significant Maternal Lab Results: None  No results found for this or any previous visit (from the past 24 hour(s)).  Patient Active Problem List   Diagnosis Date Noted  . Unwanted fertility 10/23/2018  . Obesity affecting pregnancy, antepartum 10/02/2018  . GDM (gestational diabetes mellitus) 09/11/2018  .  PCOS (polycystic ovarian syndrome) 04/17/2018  . Supervision of high risk pregnancy, antepartum 04/17/2018  . Hypertension in pregnancy, antepartum 04/17/2018  . GERD (gastroesophageal reflux disease) 04/17/2018  . Asthma, mild intermittent 04/17/2018  . AMA (advanced maternal age) multigravida 35+ 04/17/2018  . Migraines   . Obesity 02/02/2018  . High blood pressure 02/02/2018    Assessment/Plan:  Conrad Burlingtonicole J Noda is a 33 y.o. [id number] at 2328w2d here for induction of labor due to failed BPP (6/10). Pregnancy further complicated by gDMA2 and cHTN.  #Labor: Vertex by exam. Cytotec @ 1705. Could consider FB as needed at next check. Anticipate vaginal delivery. SVE 4 hours after Cytotec or as clinically indicated. #Pain: IV pain meds or Epidural when desires #FWB: Cat I; EFW: 3800g #ID:  GBS neg #MOF: Breast #MOC: BTL (private insurance) #Circ:  Yes - inpatient  #cHTN: continue Labetalol 400 mg BID #GDMA2: CBG's q4h latent labor and q2h active labor   Jerilynn Birkenheadhelsea Emalynn Clewis, MD [facility] Family Medicine Fellow, [facility], [facility] 11/15/2018, 4:12 PM

## 2018-11-15 NOTE — Procedures (Signed)
Casey Mcmahon Jul 01, 1985 [redacted]w[redacted]d  Fetus A Non-Stress Test Interpretation for 11/15/18  Indication: Unsatisfactory BPP  Fetal Heart Rate A Mode: External Baseline Rate (A): 140 bpm Variability: Moderate Accelerations: 15 x 15 Decelerations: None Multiple birth?: No  Uterine Activity Mode: Toco Contraction Frequency (min): Occ UC noted Contraction Duration (sec): 60 Contraction Quality: Mild Resting Tone Palpated: Relaxed Resting Time: Adequate  Interpretation (Fetal Testing) Nonstress Test Interpretation: Reactive Comments: FHR tracing rev'd by Dr. Donalee Citrin

## 2018-11-15 NOTE — Progress Notes (Signed)
Report called to Ginger, RN in MAU.  Pt to MAU. Dr. Donalee Citrin discussed plan of care.

## 2018-11-16 ENCOUNTER — Inpatient Hospital Stay (HOSPITAL_COMMUNITY): Payer: No Typology Code available for payment source | Admitting: Anesthesiology

## 2018-11-16 ENCOUNTER — Telehealth: Payer: No Typology Code available for payment source | Admitting: Obstetrics & Gynecology

## 2018-11-16 ENCOUNTER — Encounter: Payer: Self-pay | Admitting: *Deleted

## 2018-11-16 ENCOUNTER — Encounter (HOSPITAL_COMMUNITY): Payer: Self-pay | Admitting: *Deleted

## 2018-11-16 ENCOUNTER — Encounter (HOSPITAL_COMMUNITY)
Admission: AD | Disposition: A | Discharge status: Home / Self Care | Payer: Self-pay | Source: Home / Self Care | Attending: Family Medicine

## 2018-11-16 DIAGNOSIS — Z302 Encounter for sterilization: Secondary | ICD-10-CM

## 2018-11-16 DIAGNOSIS — Z3A38 38 weeks gestation of pregnancy: Secondary | ICD-10-CM

## 2018-11-16 DIAGNOSIS — O24425 Gestational diabetes mellitus in childbirth, controlled by oral hypoglycemic drugs: Secondary | ICD-10-CM

## 2018-11-16 DIAGNOSIS — O1002 Pre-existing essential hypertension complicating childbirth: Secondary | ICD-10-CM

## 2018-11-16 HISTORY — PX: TUBAL LIGATION: SHX77

## 2018-11-16 LAB — GLUCOSE, CAPILLARY
Glucose-Capillary: 101 mg/dL — ABNORMAL HIGH (ref 70–99)
Glucose-Capillary: 85 mg/dL (ref 70–99)
Glucose-Capillary: 89 mg/dL (ref 70–99)
Glucose-Capillary: 84 mg/dL (ref 70–99)
Glucose-Capillary: 93 mg/dL (ref 70–99)

## 2018-11-16 LAB — CBC
HCT: 33.6 % — ABNORMAL LOW (ref 36.0–46.0)
HCT: 32.6 % — ABNORMAL LOW (ref 36.0–46.0)
Hemoglobin: 11.7 g/dL — ABNORMAL LOW (ref 12.0–15.0)
Hemoglobin: 10.8 g/dL — ABNORMAL LOW (ref 12.0–15.0)
MCH: 32.6 pg (ref 26.0–34.0)
MCH: 32.1 pg (ref 26.0–34.0)
MCHC: 34.8 g/dL (ref 30.0–36.0)
MCHC: 33.1 g/dL (ref 30.0–36.0)
MCV: 93.6 fL (ref 80.0–100.0)
MCV: 97 fL (ref 80.0–100.0)
Platelets: 170 10*3/uL (ref 150–400)
Platelets: 169 10*3/uL (ref 150–400)
RBC: 3.59 MIL/uL — ABNORMAL LOW (ref 3.87–5.11)
RBC: 3.36 MIL/uL — ABNORMAL LOW (ref 3.87–5.11)
RDW: 13.8 % (ref 11.5–15.5)
RDW: 13.7 % (ref 11.5–15.5)
WBC: 9.6 10*3/uL (ref 4.0–10.5)
WBC: 12.5 10*3/uL — ABNORMAL HIGH (ref 4.0–10.5)
nRBC: 0 % (ref 0.0–0.2)
nRBC: 0 % (ref 0.0–0.2)

## 2018-11-16 LAB — RPR: RPR Ser Ql: NONREACTIVE

## 2018-11-16 SURGERY — LIGATION, FALLOPIAN TUBE, POSTPARTUM
Anesthesia: Epidural

## 2018-11-16 MED ORDER — OXYCODONE HCL 5 MG PO TABS: 5.0000 mg | ORAL_TABLET | Freq: Once | ORAL | Status: DC | PRN

## 2018-11-16 MED ORDER — SODIUM CHLORIDE (PF) 0.9 % IJ SOLN
INTRAMUSCULAR | Status: DC | PRN
  Administered 2018-11-16: 12 mL/h via EPIDURAL

## 2018-11-16 MED ORDER — MIDAZOLAM HCL 2 MG/2ML IJ SOLN
INTRAMUSCULAR | Status: AC
  Filled 2018-11-16: qty 2

## 2018-11-16 MED ORDER — DIPHENHYDRAMINE HCL 25 MG PO CAPS: 25.0000 mg | ORAL_CAPSULE | Freq: Four times a day (QID) | ORAL | Status: DC | PRN

## 2018-11-16 MED ORDER — LISINOPRIL 20 MG PO TABS
20.0000 mg | ORAL_TABLET | Freq: Every day | ORAL | Status: DC
  Administered 2018-11-17 – 2018-11-18 (×2): 20 mg via ORAL
  Filled 2018-11-16 (×2): qty 1

## 2018-11-16 MED ORDER — LACTATED RINGERS IV SOLN: 500.0000 mL | Freq: Once | INTRAVENOUS | Status: DC

## 2018-11-16 MED ORDER — ONDANSETRON HCL 4 MG/2ML IJ SOLN
INTRAMUSCULAR | Status: AC
  Filled 2018-11-16: qty 2

## 2018-11-16 MED ORDER — FENTANYL CITRATE (PF) 100 MCG/2ML IJ SOLN
INTRAMUSCULAR | Status: DC | PRN
  Administered 2018-11-16: 100 ug via EPIDURAL

## 2018-11-16 MED ORDER — OXYCODONE HCL 5 MG/5ML PO SOLN: 5.0000 mg | Freq: Once | ORAL | Status: DC | PRN

## 2018-11-16 MED ORDER — KETOROLAC TROMETHAMINE 30 MG/ML IJ SOLN: 30.0000 mg | Freq: Once | INTRAMUSCULAR | Status: DC | PRN

## 2018-11-16 MED ORDER — FENTANYL-BUPIVACAINE-NACL 0.5-0.125-0.9 MG/250ML-% EP SOLN
12.0000 mL/h | EPIDURAL | Status: DC | PRN
  Filled 2018-11-16: qty 250

## 2018-11-16 MED ORDER — FERROUS SULFATE 325 (65 FE) MG PO TABS
325.0000 mg | ORAL_TABLET | Freq: Two times a day (BID) | ORAL | Status: DC
  Administered 2018-11-17 – 2018-11-18 (×3): 325 mg via ORAL
  Filled 2018-11-16 (×3): qty 1

## 2018-11-16 MED ORDER — ONDANSETRON HCL 4 MG/2ML IJ SOLN
INTRAMUSCULAR | Status: DC | PRN
  Administered 2018-11-16: 4 mg via INTRAVENOUS

## 2018-11-16 MED ORDER — EPHEDRINE 5 MG/ML INJ: 10.0000 mg | INTRAVENOUS | Status: DC | PRN

## 2018-11-16 MED ORDER — FENTANYL CITRATE (PF) 100 MCG/2ML IJ SOLN
INTRAMUSCULAR | Status: AC
  Filled 2018-11-16: qty 2

## 2018-11-16 MED ORDER — SIMETHICONE 80 MG PO CHEW: 80.0000 mg | CHEWABLE_TABLET | ORAL | Status: DC | PRN

## 2018-11-16 MED ORDER — BUPIVACAINE HCL (PF) 0.25 % IJ SOLN
INTRAMUSCULAR | Status: AC
  Filled 2018-11-16: qty 30

## 2018-11-16 MED ORDER — PRENATAL MULTIVITAMIN CH
1.0000 | ORAL_TABLET | Freq: Every day | ORAL | Status: DC
  Administered 2018-11-17: 12:00:00 1 via ORAL
  Filled 2018-11-16: qty 1

## 2018-11-16 MED ORDER — WITCH HAZEL-GLYCERIN EX PADS: 1.0000 "application " | MEDICATED_PAD | CUTANEOUS | Status: DC | PRN

## 2018-11-16 MED ORDER — OXYTOCIN 40 UNITS IN NORMAL SALINE INFUSION - SIMPLE MED
1.0000 m[IU]/min | INTRAVENOUS | Status: DC
  Administered 2018-11-16: 02:00:00 2 m[IU]/min via INTRAVENOUS

## 2018-11-16 MED ORDER — HYDROMORPHONE HCL 1 MG/ML IJ SOLN: 0.2500 mg | INTRAMUSCULAR | Status: DC | PRN

## 2018-11-16 MED ORDER — MEASLES, MUMPS & RUBELLA VAC IJ SOLR: 0.5000 mL | Freq: Once | INTRAMUSCULAR | Status: DC

## 2018-11-16 MED ORDER — PHENYLEPHRINE 40 MCG/ML (10ML) SYRINGE FOR IV PUSH (FOR BLOOD PRESSURE SUPPORT)
80.0000 ug | PREFILLED_SYRINGE | INTRAVENOUS | Status: DC | PRN
  Filled 2018-11-16: qty 10

## 2018-11-16 MED ORDER — COCONUT OIL OIL: 1.0000 "application " | TOPICAL_OIL | Status: DC | PRN

## 2018-11-16 MED ORDER — HYDROCHLOROTHIAZIDE 25 MG PO TABS
25.0000 mg | ORAL_TABLET | Freq: Every day | ORAL | Status: DC
  Administered 2018-11-17 – 2018-11-18 (×2): 25 mg via ORAL
  Filled 2018-11-16 (×2): qty 1

## 2018-11-16 MED ORDER — IBUPROFEN 600 MG PO TABS
600.0000 mg | ORAL_TABLET | Freq: Four times a day (QID) | ORAL | Status: DC
  Administered 2018-11-16 – 2018-11-18 (×6): 600 mg via ORAL
  Filled 2018-11-16 (×6): qty 1

## 2018-11-16 MED ORDER — DIBUCAINE (PERIANAL) 1 % EX OINT: 1.0000 "application " | TOPICAL_OINTMENT | CUTANEOUS | Status: DC | PRN

## 2018-11-16 MED ORDER — OXYCODONE-ACETAMINOPHEN 5-325 MG PO TABS: 2.0000 | ORAL_TABLET | ORAL | Status: DC | PRN

## 2018-11-16 MED ORDER — BENZOCAINE-MENTHOL 20-0.5 % EX AERO: 1.0000 "application " | INHALATION_SPRAY | CUTANEOUS | Status: DC | PRN

## 2018-11-16 MED ORDER — PHENYLEPHRINE 40 MCG/ML (10ML) SYRINGE FOR IV PUSH (FOR BLOOD PRESSURE SUPPORT): 80.0000 ug | PREFILLED_SYRINGE | INTRAVENOUS | Status: DC | PRN

## 2018-11-16 MED ORDER — MAGNESIUM HYDROXIDE 400 MG/5ML PO SUSP: 30.0000 mL | ORAL | Status: DC | PRN

## 2018-11-16 MED ORDER — ONDANSETRON HCL 4 MG/2ML IJ SOLN: 4.0000 mg | Freq: Once | INTRAMUSCULAR | Status: DC | PRN

## 2018-11-16 MED ORDER — SODIUM BICARBONATE 8.4 % IV SOLN
INTRAVENOUS | Status: DC | PRN
  Administered 2018-11-16: 5 mL via EPIDURAL
  Administered 2018-11-16: 2 mL via EPIDURAL
  Administered 2018-11-16 (×2): 5 mL via EPIDURAL

## 2018-11-16 MED ORDER — LIDOCAINE HCL (PF) 1 % IJ SOLN
INTRAMUSCULAR | Status: DC | PRN
  Administered 2018-11-16: 10 mL via EPIDURAL

## 2018-11-16 MED ORDER — ACETAMINOPHEN 325 MG PO TABS
650.0000 mg | ORAL_TABLET | ORAL | Status: DC | PRN
  Administered 2018-11-17 – 2018-11-18 (×2): 650 mg via ORAL
  Filled 2018-11-16 (×2): qty 2

## 2018-11-16 MED ORDER — OXYCODONE-ACETAMINOPHEN 5-325 MG PO TABS
1.0000 | ORAL_TABLET | ORAL | Status: DC | PRN
  Administered 2018-11-17: 17:00:00 1 via ORAL
  Filled 2018-11-16: qty 1

## 2018-11-16 MED ORDER — ONDANSETRON HCL 4 MG/2ML IJ SOLN: 4.0000 mg | INTRAMUSCULAR | Status: DC | PRN

## 2018-11-16 MED ORDER — TETANUS-DIPHTH-ACELL PERTUSSIS 5-2.5-18.5 LF-MCG/0.5 IM SUSP: 0.5000 mL | Freq: Once | INTRAMUSCULAR | Status: DC

## 2018-11-16 MED ORDER — TERBUTALINE SULFATE 1 MG/ML IJ SOLN: 0.2500 mg | Freq: Once | INTRAMUSCULAR | Status: DC | PRN

## 2018-11-16 MED ORDER — ZOLPIDEM TARTRATE 5 MG PO TABS: 5.0000 mg | ORAL_TABLET | Freq: Every evening | ORAL | Status: DC | PRN

## 2018-11-16 MED ORDER — ONDANSETRON HCL 4 MG PO TABS: 4.0000 mg | ORAL_TABLET | ORAL | Status: DC | PRN

## 2018-11-16 MED ORDER — LIDOCAINE-EPINEPHRINE (PF) 2 %-1:200000 IJ SOLN
INTRAMUSCULAR | Status: AC
  Filled 2018-11-16: qty 20

## 2018-11-16 MED ORDER — DIPHENHYDRAMINE HCL 50 MG/ML IJ SOLN: 12.5000 mg | INTRAMUSCULAR | Status: DC | PRN

## 2018-11-16 MED ORDER — BUPIVACAINE HCL (PF) 0.25 % IJ SOLN
INTRAMUSCULAR | Status: DC | PRN
  Administered 2018-11-16: 30 mL

## 2018-11-16 SURGICAL SUPPLY — 20 items
CLIP FILSHIE TUBAL LIGA STRL (Clip) ×3 IMPLANT
CLOTH BEACON ORANGE TIMEOUT ST (SAFETY) ×3 IMPLANT
DRSG OPSITE POSTOP 3X4 (GAUZE/BANDAGES/DRESSINGS) ×3 IMPLANT
DURAPREP 26ML APPLICATOR (WOUND CARE) ×3 IMPLANT
GLOVE BIOGEL PI IND STRL 7.0 (GLOVE) ×2 IMPLANT
GLOVE BIOGEL PI INDICATOR 7.0 (GLOVE) ×4
GLOVE ECLIPSE 7.0 STRL STRAW (GLOVE) ×6 IMPLANT
GOWN STRL REUS W/TWL LRG LVL3 (GOWN DISPOSABLE) ×6 IMPLANT
NEEDLE HYPO 22GX1.5 SAFETY (NEEDLE) ×3 IMPLANT
NS IRRIG 1000ML POUR BTL (IV SOLUTION) ×3 IMPLANT
PACK ABDOMINAL MINOR (CUSTOM PROCEDURE TRAY) ×3 IMPLANT
PROTECTOR NERVE ULNAR (MISCELLANEOUS) ×3 IMPLANT
SPONGE LAP 4X18 RFD (DISPOSABLE) IMPLANT
SUT VIC AB 0 CT1 27 (SUTURE) ×2
SUT VIC AB 0 CT1 27XBRD ANBCTR (SUTURE) ×1 IMPLANT
SUT VICRYL 4-0 PS2 18IN ABS (SUTURE) ×3 IMPLANT
SYR CONTROL 10ML LL (SYRINGE) ×3 IMPLANT
TOWEL OR 17X24 6PK STRL BLUE (TOWEL DISPOSABLE) ×6 IMPLANT
TRAY FOLEY CATH SILVER 14FR (SET/KITS/TRAYS/PACK) ×3 IMPLANT
WATER STERILE IRR 1000ML POUR (IV SOLUTION) ×3 IMPLANT

## 2018-11-16 NOTE — Anesthesia Procedure Notes (Addendum)
Epidural Patient location during procedure: OB Start time: 11/16/2018 4:41 AM End time: 11/16/2018 5:01 AM  Staffing Anesthesiologist: Lidia Collum, MD Performed: anesthesiologist   Preanesthetic Checklist Completed: patient identified, pre-op evaluation, timeout performed, IV checked, risks and benefits discussed and monitors and equipment checked  Epidural Patient position: sitting Prep: DuraPrep Patient monitoring: heart rate, continuous pulse ox and blood pressure Approach: midline Location: L2-L3 Injection technique: LOR air  Needle:  Needle type: Tuohy  Needle gauge: 17 G Needle length: 9 cm Needle insertion depth: 6 cm Catheter type: closed end flexible Catheter size: 19 Gauge Catheter at skin depth: 11 cm  Assessment Events: blood aspirated, injection not painful, no injection resistance, negative IV test and no paresthesia  Additional Notes Initial attempt at L3-4, single pass, atraumatic with LOR at 6 cm and no return of CSF or heme. Lidocaine injected via Tuohy and catheter threaded. Heme was aspirated continuously from catheter so it was removed, and second attempt made at L2-3. LOR again at 6 cm. Catheter threaded easily with no aspiration of heme or CSF. Reason for block:procedure for pain

## 2018-11-16 NOTE — Progress Notes (Deleted)
Admission done using interpreter id: [id number]. All questions answered, VSS, call bell within reach. Pt walking in room. Will continue to monitor.  Arville Lime

## 2018-11-16 NOTE — Op Note (Signed)
Casey Mcmahon  11/16/2018  PREOPERATIVE DIAGNOSIS:  Multiparity, undesired fertility  POSTOPERATIVE DIAGNOSIS:  Multiparity, undesired fertility  PROCEDURE:  Postpartum Bilateral Tubal Sterilization using Filshie Clips   ANESTHESIA:  Epidural and local analgesia using 2.50% Marcaine  COMPLICATIONS:  None immediate.  ESTIMATED BLOOD LOSS: 5 ml.  INDICATIONS: 33 y.o. [id number]  with undesired fertility,status post vaginal delivery, desires permanent sterilization.  Other reversible forms of contraception were discussed with patient; she declines all other modalities. Risks of procedure discussed with patient including but not limited to: risk of regret, permanence of method, bleeding, infection, injury to surrounding organs and need for additional procedures.  Failure risk of 0.5-1% with increased risk of ectopic gestation if pregnancy occurs was also discussed with patient.     FINDINGS:  Normal uterus, tubes, and ovaries.  PROCEDURE DETAILS: The patient was taken to the operating room where her spinal anesthesia was dosed up to surgical level and found to be adequate.  She was then placed in a supine position and prepped and draped in the usual sterile fashion.  After an adequate timeout was performed, attention was turned to the patient's abdomen where a small transverse skin incision was made under the umbilical fold. The incision was taken down to the layer of fascia using the scalpel, and fascia was incised, and extended bilaterally. The peritoneum was entered in a sharp fashion. The patient was placed in Trendelenburg.  A moist lap pad was used to move omentum and bowel away until the left fallopian tube was identified and grasped with a Babcock clamp, and followed out to the fimbriated end.  A Filshie clip was placed on the left fallopian tube about 2 cm from the cornu.  A similar process was carried out on the right side allowing for bilateral tubal sterilization.  Good hemostasis was noted  overall.  Local analgesia was injected into both Filshie application sites.The instruments were then removed from the patient's abdomen and the fascial incision was repaired with 0 Vicryl, and the skin was closed with a 4-0 Vicryl subcuticular stitch. The patient tolerated the procedure well.  Sponge, lap, and needle counts were correct times two.  The patient was then taken to the recovery room awake, extubated and in stable condition.  [name] MD 11/16/2018 4:57 PM

## 2018-11-16 NOTE — Anesthesia Preprocedure Evaluation (Signed)
Anesthesia Evaluation  Patient identified by MRN, date of birth, ID band Patient awake    Reviewed: Allergy & Precautions, H&P , NPO status , Patient's Chart, lab work & pertinent test results  History of Anesthesia Complications Negative for: history of anesthetic complications  Airway Mallampati: II  TM Distance: >3 FB Neck ROM: full    Dental no notable dental hx.    Pulmonary asthma (mild) ,    Pulmonary exam normal        Cardiovascular hypertension, Normal cardiovascular exam Rhythm:regular Rate:Normal     Neuro/Psych negative neurological ROS  negative psych ROS   GI/Hepatic Neg liver ROS, GERD  ,  Endo/Other  diabetes, Gestational  Renal/GU negative Renal ROS  negative genitourinary   Musculoskeletal   Abdominal   Peds  Hematology negative hematology ROS (+)   Anesthesia Other Findings   Reproductive/Obstetrics (+) Pregnancy                             Anesthesia Physical Anesthesia Plan  ASA: II  Anesthesia Plan: Epidural   Post-op Pain Management:    Induction:   PONV Risk Score and Plan:   Airway Management Planned:   Additional Equipment:   Intra-op Plan:   Post-operative Plan:   Informed Consent: I have reviewed the patients History and Physical, chart, labs and discussed the procedure including the risks, benefits and alternatives for the proposed anesthesia with the patient or authorized representative who has indicated his/her understanding and acceptance.       Plan Discussed with:   Anesthesia Plan Comments:         Anesthesia Quick Evaluation

## 2018-11-16 NOTE — Progress Notes (Signed)
Casey Mcmahon is a 33 y.o. [id number] at [redacted]w[redacted]d.  Subjective: Comfortable w/ epidural.   RN unable to trace    Objective: BP 116/66   Pulse 75   Temp 98 F (36.7 C) (Oral)   Resp 20   Ht 5\' 8"  (1.727 m)   Wt 107 kg   LMP 01/19/2018 (Exact Date)   SpO2 99%   BMI 35.88 kg/m    FHT:  FHR: 125 bpm, variability: mod,  accelerations:  15x15,  decelerations:  none UC:   Q 2-5 minutes, mod Dilation: 4 Effacement (%): 80 Cervical Position: Posterior Station: -3 Presentation: Vertex AROM mod amount of clear  Exam by:: [email or web address], CNM   Labs: Results for orders placed or performed during the hospital encounter of 11/15/18 (from the past 24 hour(s))  SARS Coronavirus 2 [facility] order, Performed in [facility] hospital lab) Nasopharyngeal Nasopharyngeal Swab     Status: None   Collection Time: 11/15/18  3:09 PM   Specimen: Nasopharyngeal Swab  Result Value Ref Range   SARS Coronavirus 2 NEGATIVE NEGATIVE  Glucose, capillary     Status: None   Collection Time: 11/15/18  4:17 PM  Result Value Ref Range   Glucose-Capillary 81 70 - 99 mg/dL  Type and screen [facility]     Status: None   Collection Time: 11/15/18  4:21 PM  Result Value Ref Range   ABO/RH(D) O POS    Antibody Screen NEG    Sample Expiration      11/18/2018,2359 Performed at [facility], [address] [place], [place]   ABO/Rh     Status: None   Collection Time: 11/15/18  4:21 PM  Result Value Ref Range   ABO/RH(D)      O POS Performed at [facility], [address], [place], [place]   CBC     Status: Abnormal   Collection Time: 11/15/18  4:22 PM  Result Value Ref Range   WBC 9.1 4.0 - 10.5 K/uL   RBC 3.61 (L) 3.87 - 5.11 MIL/uL   Hemoglobin 11.6 (L) 12.0 - 15.0 g/dL   HCT 01/15/19 (L) 84.6 - 65.9 %   MCV 94.2 80.0 - 100.0 fL   MCH 32.1 26.0 - 34.0 pg   MCHC 34.1 30.0 - 36.0 g/dL   RDW 93.5 70.1 - 77.9 %   Platelets 188 150 - 400 K/uL   nRBC 0.0 0.0  - 0.2 %  RPR     Status: None   Collection Time: 11/15/18  4:22 PM  Result Value Ref Range   RPR Ser Ql NON REACTIVE NON REACTIVE  Glucose, capillary     Status: None   Collection Time: 11/15/18  8:34 PM  Result Value Ref Range   Glucose-Capillary 93 70 - 99 mg/dL  Glucose, capillary     Status: Abnormal   Collection Time: 11/16/18  1:24 AM  Result Value Ref Range   Glucose-Capillary 101 (H) 70 - 99 mg/dL  CBC     Status: Abnormal   Collection Time: 11/16/18  3:41 AM  Result Value Ref Range   WBC 9.6 4.0 - 10.5 K/uL   RBC 3.59 (L) 3.87 - 5.11 MIL/uL   Hemoglobin 11.7 (L) 12.0 - 15.0 g/dL   HCT 01/16/19 (L) 30.0 - 92.3 %   MCV 93.6 80.0 - 100.0 fL   MCH 32.6 26.0 - 34.0 pg   MCHC 34.8 30.0 - 36.0 g/dL  RDW 13.8 11.5 - 15.5 %   Platelets 170 150 - 400 K/uL   nRBC 0.0 0.0 - 0.2 %  Glucose, capillary     Status: None   Collection Time: 11/16/18  5:39 AM  Result Value Ref Range   Glucose-Capillary 85 70 - 99 mg/dL    Assessment / Plan: [redacted]w[redacted]d week IUP Labor: Early  Fetal Wellbeing:  Category I  Pain Control:  Epidural  GDM: CBGs Nml   Anticipated MOD:  SVD  Tamala Julian, [place], [place] 11/16/2018 12:00 PM

## 2018-11-16 NOTE — Discharge Summary (Signed)
Postpartum Discharge Summary     Patient Name: Casey Mcmahon DOB: 03-May-1985 MRN: [id number]  Date of admission: 11/15/2018 Delivering Provider: Gifford Shave   Date of discharge: 11/18/2018  Admitting diagnosis: PREG Intrauterine pregnancy: [redacted]w[redacted]d    Secondary diagnosis:  Active Problems:   Obesity   Hypertension in pregnancy, antepartum   Asthma, mild intermittent   AMA (advanced maternal age) multigravida 35+   GDM (gestational diabetes mellitus)   Obesity affecting pregnancy, antepartum   Unwanted fertility   Chronic hypertension affecting pregnancy   Vaginal delivery  Additional problems: none     Discharge diagnosis: Term Pregnancy Delivered                                                                                                Post partum procedures:postpartum tubal ligation  Augmentation: AROM, Pitocin and Cytotec  Complications: None  Hospital course:  Induction of Labor With Vaginal Delivery   33y.o. yo [phone] admitted to the hospital 11/15/2018 for induction of labor.  Indication for induction: BPP 6/8, CHTN, A2 GDM.  Patient had an uncomplicated labor course as follows: Membrane Rupture Time/Date: 10:26 AM ,11/16/2018   Intrapartum Procedures: Episiotomy: None [1]                                         Lacerations:  None [1]  Patient had delivery of a Viable infant.  Information for the patient's newborn:  CaQuetzaly, Ebner0[[id number]]Delivery Method: Vag-Spont    11/16/2018  Details of delivery can be found in separate delivery note.  Patient had a routine postpartum course. PPBTL 11/16/18. Patient is discharged home 11/18/18. Delivery time: 1:58 PM    Magnesium Sulfate received: No BMZ received: No Rhophylac:No MMR:No Transfusion:No  Physical exam  Vitals:   11/17/18 1055 11/17/18 1640 11/17/18 2332 11/18/18 0519  BP: 121/71 122/67 124/65 130/85  Pulse:  80 75 75  Resp:  18 18 18   Temp:  98.7 F (37.1 C) 98.9 F  (37.2 C) 98.6 F (37 C)  TempSrc:  Oral Oral Oral  SpO2:  100%    Weight:      Height:       General: alert, cooperative and no distress Lochia: appropriate Uterine Fundus: firm Incision: Healing well with no significant drainage, No significant erythema, Dressing is clean, dry, and intact DVT Evaluation: No evidence of DVT seen on physical exam. Negative Homan's sign. No cords or calf tenderness. No significant calf/ankle edema. Labs: Lab Results  Component Value Date   WBC 12.5 (H) 11/16/2018   HGB 10.8 (L) 11/16/2018   HCT 32.6 (L) 11/16/2018   MCV 97.0 11/16/2018   PLT 169 11/16/2018   CMP Latest Ref Rng & Units 04/17/2018  Glucose 65 - 99 mg/dL 101(H)  BUN 6 - 20 mg/dL 7  Creatinine 0.57 - 1.00 mg/dL 0.81  Sodium 134 - 144 mmol/L 138  Potassium 3.5 - 5.2 mmol/L 4.3  Chloride 96 - 106 mmol/L  103  CO2 20 - 29 mmol/L 21  Calcium 8.7 - 10.2 mg/dL 9.9  Total Protein 6.0 - 8.5 g/dL 6.9  Total Bilirubin 0.0 - 1.2 mg/dL 0.4  Alkaline Phos 39 - 117 IU/L 52  AST 0 - 40 IU/L 18  ALT 0 - 32 IU/L 19    Discharge instruction: per After Visit Summary and "Baby and Me Booklet".  After visit meds:  Allergies as of 11/18/2018   No Known Allergies     Medication List    STOP taking these medications   glucose blood test strip   labetalol 200 MG tablet Commonly known as: NORMODYNE   metFORMIN 500 MG tablet Commonly known as: GLUCOPHAGE   onetouch ultrasoft lancets     TAKE these medications   acetaminophen 500 MG tablet Commonly known as: TYLENOL Take 1,000 mg by mouth every 6 (six) hours as needed for mild pain.   aspirin EC 81 MG tablet Take 1 tablet (81 mg total) by mouth daily. Start on 05/15/2018 or later   diphenhydrAMINE 25 MG tablet Commonly known as: BENADRYL Take 25 mg by mouth every 6 (six) hours as needed.   fluticasone 50 MCG/ACT nasal spray Commonly known as: FLONASE Place 1 spray into both nostrils daily.   hydrochlorothiazide 25 MG  tablet Commonly known as: HYDRODIURIL Take 1 tablet (25 mg total) by mouth daily.   lisinopril 20 MG tablet Commonly known as: ZESTRIL Take 1 tablet (20 mg total) by mouth daily.   oxyCODONE 5 MG immediate release tablet Commonly known as: Roxicodone Take 1 tablet (5 mg total) by mouth every 8 (eight) hours as needed.   Prenatal 27-1 MG Tabs Take 1 tablet by mouth daily.   promethazine 12.5 MG tablet Commonly known as: PHENERGAN Take 1 tablet (12.5 mg total) by mouth every 6 (six) hours as needed for nausea or vomiting.       Diet: carb modified diet  Activity: Advance as tolerated. Pelvic rest for 6 weeks.   Outpatient follow up:6 weeks Follow up Appt: Future Appointments  Date Time Provider [place]  11/18/2018  8:40 AM [facility] 1 [facility] None   Follow up Visit:  Please schedule this patient for Postpartum visit in: 6 weeks with the following provider: Any provider. In-person visit for GTT.  For C/S patients schedule nurse incision check in weeks 2 weeks: NA High risk pregnancy complicated by: [id number], CHTN Delivery mode:  SVD Anticipated Birth Control:  BTL done PP PP Procedures needed: 2 hour GTT, Virtual BP check ~11/21/18 Schedule Integrated BH visit: no  Newborn Data: Live born female  Birth Weight: 7 lb 9.2 oz (3436 g) APGAR: 8, 9  Newborn Delivery   Birth date/time: 11/16/2018 13:58:00 Delivery type: Vaginal, Spontaneous      Baby Feeding: Breast Disposition:home with mother   11/18/2018 Merilyn Baba, DO

## 2018-11-16 NOTE — Transfer of Care (Signed)
Immediate Anesthesia Transfer of Care Note  Patient: Casey Mcmahon  Procedure(s) Performed: POST PARTUM TUBAL LIGATION (N/A )  Patient Location: PACU  Anesthesia Type:Epidural  Level of Consciousness: awake  Airway & Oxygen Therapy: Patient Spontanous Breathing  Post-op Assessment: Report given to RN  Post vital signs: Reviewed and stable  Last Vitals:  Vitals Value Taken Time  BP 128/65 11/16/18 1658  Temp    Pulse 86 11/16/18 1701  Resp 17 11/16/18 1701  SpO2 99 % 11/16/18 1701  Vitals shown include unvalidated device data.  Last Pain:  Vitals:   11/16/18 1415  TempSrc:   PainSc: 0-No pain         Complications: No apparent anesthesia complications

## 2018-11-16 NOTE — Progress Notes (Addendum)
LABOR PROGRESS NOTE  Casey Mcmahon is a 33 y.o. [id number] at [redacted]w[redacted]d  admitted for IOL for failed BPP (6/10), gestational diabetes, chronic hypertension.  Subjective: Patient is doing well at this time.  She reports increased pressure but not much change from her previous check approximately 1 hour ago.  She denies headaches, changes in vision, increase swelling in her feet.  Objective: BP 126/81   Pulse (!) 120   Temp 98 F (36.7 C) (Oral)   Resp 18   Ht 5\' 8"  (1.727 m)   Wt 107 kg   LMP 01/19/2018 (Exact Date)   SpO2 99%   BMI 35.88 kg/m  or  Vitals:   11/16/18 1030 11/16/18 1100 11/16/18 1130 11/16/18 1200  BP: 114/73 117/73 116/66 126/81  Pulse: 92 88 75 (!) 120  Resp: 18 18 20 18   Temp: 98 F (36.7 C)     TempSrc: Oral     SpO2:      Weight:      Height:       General: Resting comfortably Dilation: 7 Effacement (%): 80 Cervical Position: Posterior Station: -1 Presentation: Vertex Exam by:: Cecelia Byars, RN FHT: baseline rate 120, moderate moderate varibility, positive acel, negative decel Toco: Adequate contractions every 6 hours  Labs: Lab Results  Component Value Date   WBC 9.6 11/16/2018   HGB 11.7 (L) 11/16/2018   HCT 33.6 (L) 11/16/2018   MCV 93.6 11/16/2018   PLT 170 11/16/2018    Patient Active Problem List   Diagnosis Date Noted  . Chronic hypertension affecting pregnancy 11/15/2018  . Unwanted fertility 10/23/2018  . Obesity affecting pregnancy, antepartum 10/02/2018  . GDM (gestational diabetes mellitus) 09/11/2018  . PCOS (polycystic ovarian syndrome) 04/17/2018  . Supervision of high risk pregnancy, antepartum 04/17/2018  . Hypertension in pregnancy, antepartum 04/17/2018  . GERD (gastroesophageal reflux disease) 04/17/2018  . Asthma, mild intermittent 04/17/2018  . AMA (advanced maternal age) multigravida 35+ 04/17/2018  . Migraines   . Obesity 02/02/2018  . High blood pressure 02/02/2018    Assessment / Plan: 33 y.o. [id number] at  [redacted]w[redacted]d here for induction of labor because of failed BPP (6/10)  Labor: Progressing well, epidural in place.  She has adequate contractions.  We will continue to monitor Fetal Wellbeing: Category 1, reassuring Pain Control: Epidural in place Anticipated MOD: Vaginal  Gifford Shave, MD  PGY-1, [facility]  11/16/2018, 12:45 PM   I was present for the exam and agree with above.  Tamala Julian, [place], [place] 11/16/2018 5:19 PM

## 2018-11-16 NOTE — Progress Notes (Signed)
Patient ID: Casey Mcmahon, female   DOB: Jul 24, 1985, 33 y.o.   MRN: [id number]  In room to eval pt- she appeared to be sleeping and I didn't disturb her. Epidural has been placed; s/p cytotec x 2 doses with Pitocin started approx 1-2am  BP 104/54 P 71 FHR 120-130s, +accels, occ variable Ctx irreg 2-5 mins with Pit at 59mu/min Cx deferred (was 2-3/70/vtx -3 by RN at 0535)  IUP @ 38.3wks Cx favorable GDMA2 (Metformin) cHTN (Labetalol)  Plan to check cx in next couple of hours Anticipate vag del  Myrtis Ser CNM 11/16/2018

## 2018-11-17 LAB — GLUCOSE, CAPILLARY: Glucose-Capillary: 93 mg/dL (ref 70–99)

## 2018-11-17 NOTE — Anesthesia Preprocedure Evaluation (Signed)
Anesthesia Evaluation  Patient identified by MRN, date of birth, ID band Patient awake    Reviewed: Allergy & Precautions, NPO status , Patient's Chart, lab work & pertinent test results  Airway Mallampati: II  TM Distance: >3 FB Neck ROM: Full    Dental no notable dental hx. (+) Teeth Intact   Pulmonary asthma ,    Pulmonary exam normal breath sounds clear to auscultation       Cardiovascular Exercise Tolerance: Good hypertension, Normal cardiovascular exam Rhythm:Regular Rate:Normal     Neuro/Psych negative neurological ROS  negative psych ROS   GI/Hepatic negative GI ROS, Neg liver ROS,   Endo/Other  diabetes  Renal/GU negative Renal ROS     Musculoskeletal   Abdominal (+) + obese,   Peds  Hematology   Anesthesia Other Findings   Reproductive/Obstetrics                             Anesthesia Physical Anesthesia Plan  ASA: III  Anesthesia Plan: Epidural   Post-op Pain Management:    Induction:   PONV Risk Score and Plan: Ondansetron, Dexamethasone and Treatment may vary due to age or medical condition  Airway Management Planned: Natural Airway  Additional Equipment: None  Intra-op Plan:   Post-operative Plan:   Informed Consent: I have reviewed the patients History and Physical, chart, labs and discussed the procedure including the risks, benefits and alternatives for the proposed anesthesia with the patient or authorized representative who has indicated his/her understanding and acceptance.     Dental advisory given  Plan Discussed with:   Anesthesia Plan Comments:         Anesthesia Quick Evaluation

## 2018-11-17 NOTE — Progress Notes (Signed)
PPD/POD Day 1 Subjective: Casey Mcmahon  is a 33 y.o. [phone] s/p SVD/pp BTL at [redacted]w[redacted]d.  She reports she is doing well. No acute events overnight. She denies any problems with ambulating, voiding or po intake. Denies nausea or vomiting.  Pain is well controlled on ibuprofen. Lochia is moderate and improving.  Objective: Blood pressure 121/71, pulse 82, temperature 98 F (36.7 C), temperature source Oral, resp. rate 20, height 5\' 8"  (1.727 m), weight 107 kg, last menstrual period 01/19/2018, SpO2 100 %, currently breastfeeding.  Physical Exam:  General: alert, cooperative and no distress Lochia: appropriate Uterine Fundus: firm Incision: healing well, no significant drainage, no dehiscence, no significant erythema DVT Evaluation: No evidence of DVT seen on physical exam. Negative Homan's sign. No cords or calf tenderness. No significant calf/ankle edema.  Recent Labs    11/16/18 0341 11/16/18 1813  HGB 11.7* 10.8*  HCT 33.6* 32.6*    Assessment/Plan: Plan for discharge tomorrow, Breastfeeding, Lactation consult, Circumcision prior to discharge and Contraception pp BTL   LOS: 2 days   Laury Deep, MSN, CNM 11/17/2018, 11:29 AM

## 2018-11-17 NOTE — Lactation Note (Addendum)
This note was copied from a baby's chart. Lactation Consultation Note  Patient Name: Boy Dannika Hilgeman [id number] Date: 11/17/2018 Reason for consult: Initial assessment;Difficult latch;1st time breastfeeding;Early term 37-38.6wks  LC in to visit with P3 Mom of ET infant at 10 hrs old.  This is Mom's first time breastfeeding and she states she has been trying her best.  Offered to assist as baby just had lab work drawn and was fussy.  Baby noted to be sucking on his tongue.  Showed Mom how to suck train prior to latching.  Baby unable to latch on more than nipple.   Initiated a 24 mm nipple shield with instructions to pump first to prime the breast. Nipple pulled well into shield, and colostrum visible with pre-pumping.  Positioned baby in football hold on left side.  Teaching on importance of breast support and support under baby.  Baby able to latch somewhat deeper.    DEBP set up with instructions to pump after baby breastfeeds, and to feed baby any EBM to baby.  Curved tip syringe given.  Plan- 1- Keep baby STS as much as possible 2- Breast feed baby with cues.  Pre-pump and apply nipple shield prn to help baby be able to attain a deep latch to breast. 3- Pump both breasts 15 mins on initiation setting 4- Feed baby any EBM by spoon or curved tip syringe.  Lactation brochure given, Mom aware of IP and OP Lactation support available.  Encouraged Mom to call prn for assistance.    Maternal Data Formula Feeding for Exclusion: No Has patient been taught Hand Expression?: Yes Does the patient have breastfeeding experience prior to this delivery?: No  Feeding Feeding Type: Breast Fed  LATCH Score Latch: Repeated attempts needed to sustain latch, nipple held in mouth throughout feeding, stimulation needed to elicit sucking reflex.  Audible Swallowing: None  Type of Nipple: Everted at rest and after stimulation  Comfort (Breast/Nipple): Soft / non-tender  Hold (Positioning):  Assistance needed to correctly position infant at breast and maintain latch.  LATCH Score: 6  Interventions Interventions: Breast feeding basics reviewed;Assisted with latch;Skin to skin;Breast massage;Hand express;Pre-pump if needed;Breast compression;Adjust position;Support pillows;Position options;DEBP;Hand pump  Lactation Tools Discussed/Used Tools: Nipple Shields Nipple shield size: 24   Consult Status Consult Status: Follow-up Date: 11/18/18 Follow-up type: In-patient    Broadus John 11/17/2018, 3:05 PM

## 2018-11-17 NOTE — Lactation Note (Signed)
This note was copied from a baby's chart. Lactation Consultation Note  Patient Name: Casey Mcmahon [id number] Date: 11/17/2018 Reason for consult: Follow-up assessment  Follow Up LC Visit:  Previous LC requested DEBP set up:  Pump parts, assembly, disassembly and cleaning of pump parts discussed.  Mother did a return demonstration on pump assembly.  Answered questions related to pumping and pacifier use for parents.    Observed mother pumping for 7 minutes while discussing basic breast feeding basics.  Mother will call RN/LC as needed for assistance.  Father present.   Maternal Data Formula Feeding for Exclusion: No Has patient been taught Hand Expression?: Yes Does the patient have breastfeeding experience prior to this delivery?: No  Feeding Feeding Type: Breast Fed  LATCH Score Latch: Repeated attempts needed to sustain latch, nipple held in mouth throughout feeding, stimulation needed to elicit sucking reflex.  Audible Swallowing: None  Type of Nipple: Everted at rest and after stimulation  Comfort (Breast/Nipple): Soft / non-tender  Hold (Positioning): Assistance needed to correctly position infant at breast and maintain latch.  LATCH Score: 6  Interventions Interventions: Breast feeding basics reviewed;Assisted with latch;Skin to skin;Breast massage;Hand express;Pre-pump if needed;Breast compression;Adjust position;Support pillows;Position options;DEBP;Hand pump  Lactation Tools Discussed/Used Tools: Pump;Nipple Shields Nipple shield size: 24 Breast pump type: Manual   Consult Status Consult Status: Follow-up Date: 11/18/18 Follow-up type: In-patient    Casey Mcmahon 11/17/2018, 3:59 PM

## 2018-11-17 NOTE — Anesthesia Postprocedure Evaluation (Signed)
Anesthesia Post Note  Patient: Casey Mcmahon  Procedure(s) Performed: AN AD HOC LABOR EPIDURAL     Patient location during evaluation: Mother Baby Anesthesia Type: Epidural Level of consciousness: awake and alert Pain management: pain level controlled Vital Signs Assessment: post-procedure vital signs reviewed and stable Respiratory status: spontaneous breathing, nonlabored ventilation and respiratory function stable Cardiovascular status: stable Postop Assessment: no headache, no backache and epidural receding Anesthetic complications: no    Last Vitals:  Vitals:   11/17/18 1054 11/17/18 1055  BP: 121/71 121/71  Pulse: 82   Resp:    Temp:    SpO2:      Last Pain:  Vitals:   11/17/18 1400  TempSrc:   PainSc: 2    Pain Goal:                   Barnet Glasgow

## 2018-11-17 NOTE — Anesthesia Postprocedure Evaluation (Signed)
Anesthesia Post Note  Patient: Casey Mcmahon  Procedure(s) Performed: POST PARTUM TUBAL LIGATION (N/A )     Patient location during evaluation: Mother Baby Anesthesia Type: Epidural Level of consciousness: awake and alert and oriented Pain management: pain level not controlled Vital Signs Assessment: post-procedure vital signs reviewed and stable Respiratory status: spontaneous breathing Cardiovascular status: stable Postop Assessment: no headache, adequate PO intake, no backache, patient able to bend at knees, able to ambulate, epidural receding and no apparent nausea or vomiting Anesthetic complications: no    Last Vitals:  Vitals:   11/17/18 0340 11/17/18 0558  BP: 131/83 133/73  Pulse: 77 75  Resp: 16   Temp: 36.9 C   SpO2: 100% 98%    Last Pain:  Vitals:   11/17/18 0730  TempSrc:   PainSc: 3    Pain Goal:                   Ailene Ards

## 2018-11-18 ENCOUNTER — Other Ambulatory Visit (HOSPITAL_COMMUNITY): Payer: No Typology Code available for payment source | Attending: Internal Medicine

## 2018-11-18 MED ORDER — HYDROCHLOROTHIAZIDE 25 MG PO TABS: 25.0000 mg | ORAL_TABLET | Freq: Every day | ORAL | 1 refills | Status: DC

## 2018-11-18 MED ORDER — OXYCODONE HCL 5 MG PO TABS: 5.0000 mg | ORAL_TABLET | Freq: Three times a day (TID) | ORAL | 0 refills | Status: AC | PRN

## 2018-11-18 MED ORDER — LISINOPRIL 20 MG PO TABS: 20.0000 mg | ORAL_TABLET | Freq: Every day | ORAL | 1 refills | Status: DC

## 2018-11-18 NOTE — Discharge Instructions (Signed)
Postpartum Care After Vaginal Delivery °This sheet gives you information about how to care for yourself from the time you deliver your baby to up to 6-12 weeks after delivery (postpartum period). Your health care provider may also give you more specific instructions. If you have problems or questions, contact your health care provider. °Follow these instructions at home: °Vaginal bleeding °· It is normal to have vaginal bleeding (lochia) after delivery. Wear a sanitary pad for vaginal bleeding and discharge. °? During the first week after delivery, the amount and appearance of lochia is often similar to a menstrual period. °? Over the next few weeks, it will gradually decrease to a dry, yellow-brown discharge. °? For most women, lochia stops completely by 4-6 weeks after delivery. Vaginal bleeding can vary from woman to woman. °· Change your sanitary pads frequently. Watch for any changes in your flow, such as: °? A sudden increase in volume. °? A change in color. °? Large blood clots. °· If you pass a blood clot from your vagina, save it and call your health care provider to discuss. Do not flush blood clots down the toilet before talking with your health care provider. °· Do not use tampons or douches until your health care provider says this is safe. °· If you are not breastfeeding, your period should return 6-8 weeks after delivery. If you are feeding your child breast milk only (exclusive breastfeeding), your period may not return until you stop breastfeeding. °Perineal care °· Keep the area between the vagina and the anus (perineum) clean and dry as told by your health care provider. Use medicated pads and pain-relieving sprays and creams as directed. °· If you had a cut in the perineum (episiotomy) or a tear in the vagina, check the area for signs of infection until you are healed. Check for: °? More redness, swelling, or pain. °? Fluid or blood coming from the cut or tear. °? Warmth. °? Pus or a bad  smell. °· You may be given a squirt bottle to use instead of wiping to clean the perineum area after you go to the bathroom. As you start healing, you may use the squirt bottle before wiping yourself. Make sure to wipe gently. °· To relieve pain caused by an episiotomy, a tear in the vagina, or swollen veins in the anus (hemorrhoids), try taking a warm sitz bath 2-3 times a day. A sitz bath is a warm water bath that is taken while you are sitting down. The water should only come up to your hips and should cover your buttocks. °Breast care °· Within the first few days after delivery, your breasts may feel heavy, full, and uncomfortable (breast engorgement). Milk may also leak from your breasts. Your health care provider can suggest ways to help relieve the discomfort. Breast engorgement should go away within a few days. °· If you are breastfeeding: °? Wear a bra that supports your breasts and fits you well. °? Keep your nipples clean and dry. Apply creams and ointments as told by your health care provider. °? You may need to use breast pads to absorb milk that leaks from your breasts. °? You may have uterine contractions every time you breastfeed for up to several weeks after delivery. Uterine contractions help your uterus return to its normal size. °? If you have any problems with breastfeeding, work with your health care provider or lactation consultant. °· If you are not breastfeeding: °? Avoid touching your breasts a lot. Doing this can make   your breasts produce more milk. °? Wear a good-fitting bra and use cold packs to help with swelling. °? Do not squeeze out (express) milk. This causes you to make more milk. °Intimacy and sexuality °· Ask your health care provider when you can engage in sexual activity. This may depend on: °? Your risk of infection. °? How fast you are healing. °? Your comfort and desire to engage in sexual activity. °· You are able to get pregnant after delivery, even if you have not had  your period. If desired, talk with your health care provider about methods of birth control (contraception). °Medicines °· Take over-the-counter and prescription medicines only as told by your health care provider. °· If you were prescribed an antibiotic medicine, take it as told by your health care provider. Do not stop taking the antibiotic even if you start to feel better. °Activity °· Gradually return to your normal activities as told by your health care provider. Ask your health care provider what activities are safe for you. °· Rest as much as possible. Try to rest or take a nap while your baby is sleeping. °Eating and drinking ° °· Drink enough fluid to keep your urine pale yellow. °· Eat high-fiber foods every day. These may help prevent or relieve constipation. High-fiber foods include: °? Whole grain cereals and breads. °? Brown rice. °? Beans. °? Fresh fruits and vegetables. °· Do not try to lose weight quickly by cutting back on calories. °· Take your prenatal vitamins until your postpartum checkup or until your health care provider tells you it is okay to stop. °Lifestyle °· Do not use any products that contain nicotine or tobacco, such as cigarettes and e-cigarettes. If you need help quitting, ask your health care provider. °· Do not drink alcohol, especially if you are breastfeeding. °General instructions °· Keep all follow-up visits for you and your baby as told by your health care provider. Most women visit their health care provider for a postpartum checkup within the first 3-6 weeks after delivery. °Contact a health care provider if: °· You feel unable to cope with the changes that your child brings to your life, and these feelings do not go away. °· You feel unusually sad or worried. °· Your breasts become red, painful, or hard. °· You have a fever. °· You have trouble holding urine or keeping urine from leaking. °· You have little or no interest in activities you used to enjoy. °· You have not  breastfed at all and you have not had a menstrual period for 12 weeks after delivery. °· You have stopped breastfeeding and you have not had a menstrual period for 12 weeks after you stopped breastfeeding. °· You have questions about caring for yourself or your baby. °· You pass a blood clot from your vagina. °Get help right away if: °· You have chest pain. °· You have difficulty breathing. °· You have sudden, severe leg pain. °· You have severe pain or cramping in your lower abdomen. °· You bleed from your vagina so much that you fill more than one sanitary pad in one hour. Bleeding should not be heavier than your heaviest period. °· You develop a severe headache. °· You faint. °· You have blurred vision or spots in your vision. °· You have bad-smelling vaginal discharge. °· You have thoughts about hurting yourself or your baby. °If you ever feel like you may hurt yourself or others, or have thoughts about taking your own life, get help   right away. You can go to the nearest emergency department or call:  Your local emergency services (911 in the [place]  A suicide crisis helpline, such as the National Suicide Prevention Lifeline at 726-602-6156. This is open 24 hours a day. Summary  The period of time right after you deliver your newborn up to 6-12 weeks after delivery is called the postpartum period.  Gradually return to your normal activities as told by your health care provider.  Keep all follow-up visits for you and your baby as told by your health care provider. This information is not intended to replace advice given to you by your health care provider. Make sure you discuss any questions you have with your health care provider. Document Released: 11/22/2006 Document Revised: 01/28/2017 Document Reviewed: 11/08/2016 Elsevier Patient Education  2020 [employer]  Postpartum Tubal Ligation, Care After This sheet gives you information about how to care for yourself after your procedure. Your  health care provider may also give you more specific instructions. If you have problems or questions, contact your health care provider. What can I expect after the procedure? After the procedure, you may have:  A sore throat.  Bruising or pain in your back.  Nausea or vomiting.  Dizziness.  Mild abdominal discomfort or pain, such as cramping, gas pain, or feeling bloated.  Soreness around the incision area.  Tiredness.  Pain in your shoulders. Follow these instructions at home: Medicines  Ask your health care provider if the medicine prescribed to you: ? Requires you to avoid driving or using heavy machinery. ? Can cause constipation. You may need to take actions to prevent or treat constipation, such as:  Drink enough fluid to keep your urine pale yellow.  Take over-the-counter or prescription medicines.  Eat foods that are high in fiber, such as beans, whole grains, and fresh fruits and vegetables.  Limit foods that are high in fat and processed sugars, such as fried or sweet foods.  Do not take aspirin because it can cause bleeding. Activity  Rest for the remainder of the day.  Return to your normal activities as told by your health care provider. Ask your health care provider what activities are safe for you.  Do not have sex, douche, or put a tampon or anything else in your vagina for 6 weeks or as long as told by your health care provider.  Do not lift anything that is heavier than your baby for 2 weeks, or the limit that you are told, until your health care provider says that it is safe. Incision care      Follow instructions from your health care provider about how to take care of your incision. Make sure you: ? Wash your hands with soap and water before and after you change your bandage (dressing). If soap and water are not available, use hand sanitizer. ? Change your dressing as told by your health care provider. ? Leave stitches (sutures), skin glue, or  adhesive strips in place. These skin closures may need to stay in place for 2 weeks or longer. If adhesive strip edges start to loosen and curl up, you may trim the loose edges. Do not remove adhesive strips completely unless your health care provider tells you to do that.  Check your incision area every day for signs of infection. Check for: ? Redness, swelling, or pain. ? Fluid or blood. ? Warmth. ? Pus or a bad smell. Other Instructions  Do not take baths, swim, or use  a hot tub until your health care provider approves. Ask your health care provider if you may take showers. You may only be allowed to take sponge baths.  Keep all follow-up visits as told by your health care provider. This is important. Contact a health care provider if:  You have redness, swelling, or pain around your incision.  Your incision feels warm to the touch.  Your pain does not improve after 2-3 days.  You have a rash.  You repeatedly become dizzy or lightheaded.  Your pain medicine is not helping.  You are constipated. Get help right away if you:  Have a fever.  Faint.  Have pain in your abdomen that gets worse.  Have fluid or blood coming from your incision.  You have pus or a bad smell coming from your incision.  The edges of your incision break open after the sutures have been removed.  Have shortness of breath or trouble breathing.  Have chest pain or leg pain.  Have ongoing nausea or diarrhea. Summary  Mild abdominal discomfort is common after this procedure.  Contact your health care provider if you experience problems or have concerns.  Do not lift anything that is heavier than your baby for 2 weeks, or the limit that you are told, until your health care provider says that it is safe.  Keep all follow-up visits as told by your health care provider. This is important. This information is not intended to replace advice given to you by your health care provider. Make sure you  discuss any questions you have with your health care provider. Document Released: 07/27/2011 Document Revised: 12/15/2017 Document Reviewed: 12/15/2017 Elsevier Patient Education  [place]: May take Ibuprofen 800 mg every 8 hours May take Tylenol 1000 mg every 8 hours Oxycodone as needed for pain not controlled by Tylenol or Motrin

## 2018-11-18 NOTE — Lactation Note (Signed)
This note was copied from a baby's chart. Lactation Consultation Note  Patient Name: Casey Mcmahon [id number] Date: 11/18/2018 Reason for consult: Follow-up assessment;Early term 35-38.6wks Baby is 44 hours old/4% weight loss.  Mom reports that baby is feeding well using a 24 mm nipple shield.  Baby is currently on breast using shield and latch is good.  Baby is actively feeding with a few swallows.  Discussed milk coming to volume and the prevention and treatment of engorgement.  She has a breast pump at home.  Instructed to continue post pumping for stimulation.  Answered questions.  Reviewed lactation outpatient services and support and encouraged to call prn.  Maternal Data    Feeding Feeding Type: Breast Fed  LATCH Score                   Interventions    Lactation Tools Discussed/Used     Consult Status Consult Status: Complete Follow-up type: Call as needed    Ave Filter 11/18/2018, 10:24 AM

## 2018-11-20 ENCOUNTER — Inpatient Hospital Stay (HOSPITAL_COMMUNITY): Payer: No Typology Code available for payment source

## 2018-11-28 DIAGNOSIS — Z029 Encounter for administrative examinations, unspecified: Secondary | ICD-10-CM

## 2018-12-04 ENCOUNTER — Encounter (HOSPITAL_COMMUNITY): Payer: Self-pay | Admitting: Emergency Medicine

## 2018-12-04 ENCOUNTER — Emergency Department (HOSPITAL_COMMUNITY): Payer: No Typology Code available for payment source

## 2018-12-04 ENCOUNTER — Other Ambulatory Visit: Payer: Self-pay

## 2018-12-04 ENCOUNTER — Inpatient Hospital Stay (HOSPITAL_COMMUNITY)
Admission: EM | Admit: 2018-12-04 | Discharge: 2018-12-06 | DRG: 776 | Disposition: A | Discharge status: Home / Self Care | Payer: No Typology Code available for payment source | Attending: Obstetrics and Gynecology | Admitting: Obstetrics and Gynecology

## 2018-12-04 DIAGNOSIS — R1011 Right upper quadrant pain: Secondary | ICD-10-CM

## 2018-12-04 DIAGNOSIS — Z20828 Contact with and (suspected) exposure to other viral communicable diseases: Secondary | ICD-10-CM | POA: Diagnosis present

## 2018-12-04 DIAGNOSIS — O1420 HELLP syndrome (HELLP), unspecified trimester: Secondary | ICD-10-CM | POA: Diagnosis present

## 2018-12-04 DIAGNOSIS — O1425 HELLP syndrome, complicating the puerperium: Secondary | ICD-10-CM

## 2018-12-04 DIAGNOSIS — R7989 Other specified abnormal findings of blood chemistry: Secondary | ICD-10-CM

## 2018-12-04 DIAGNOSIS — K838 Other specified diseases of biliary tract: Secondary | ICD-10-CM

## 2018-12-04 DIAGNOSIS — O1495 Unspecified pre-eclampsia, complicating the puerperium: Secondary | ICD-10-CM

## 2018-12-04 LAB — COMPREHENSIVE METABOLIC PANEL
ALT: 192 U/L — ABNORMAL HIGH (ref 0–44)
ALT: 141 U/L — ABNORMAL HIGH (ref 0–44)
AST: 278 U/L — ABNORMAL HIGH (ref 15–41)
AST: 108 U/L — ABNORMAL HIGH (ref 15–41)
Albumin: 3.9 g/dL (ref 3.5–5.0)
Albumin: 3.6 g/dL (ref 3.5–5.0)
Alkaline Phosphatase: 106 U/L (ref 38–126)
Alkaline Phosphatase: 94 U/L (ref 38–126)
Anion gap: 11 (ref 5–15)
Anion gap: 11 (ref 5–15)
BUN: 7 mg/dL (ref 6–20)
BUN: 9 mg/dL (ref 6–20)
CO2: 21 mmol/L — ABNORMAL LOW (ref 22–32)
CO2: 22 mmol/L (ref 22–32)
Calcium: 9.5 mg/dL (ref 8.9–10.3)
Calcium: 9.1 mg/dL (ref 8.9–10.3)
Chloride: 105 mmol/L (ref 98–111)
Chloride: 108 mmol/L (ref 98–111)
Creatinine, Ser: 0.92 mg/dL (ref 0.44–1.00)
Creatinine, Ser: 0.87 mg/dL (ref 0.44–1.00)
GFR calc Af Amer: >60 mL/min (ref >60)
GFR calc Af Amer: >60 mL/min (ref >60)
GFR calc non Af Amer: >60 mL/min (ref >60)
GFR calc non Af Amer: >60 mL/min (ref >60)
Glucose, Bld: 118 mg/dL — ABNORMAL HIGH (ref 70–99)
Glucose, Bld: 80 mg/dL (ref 70–99)
Potassium: 4.3 mmol/L (ref 3.5–5.1)
Potassium: 3.8 mmol/L (ref 3.5–5.1)
Sodium: 137 mmol/L (ref 135–145)
Sodium: 141 mmol/L (ref 135–145)
Total Bilirubin: 1.3 mg/dL — ABNORMAL HIGH (ref 0.3–1.2)
Total Bilirubin: 0.7 mg/dL (ref 0.3–1.2)
Total Protein: 7.8 g/dL (ref 6.5–8.1)
Total Protein: 6.8 g/dL (ref 6.5–8.1)

## 2018-12-04 LAB — CBC
HCT: 41.4 % (ref 36.0–46.0)
HCT: 39.3 % (ref 36.0–46.0)
Hemoglobin: 13.9 g/dL (ref 12.0–15.0)
Hemoglobin: 13.1 g/dL (ref 12.0–15.0)
MCH: 31.4 pg (ref 26.0–34.0)
MCH: 31.5 pg (ref 26.0–34.0)
MCHC: 33.6 g/dL (ref 30.0–36.0)
MCHC: 33.3 g/dL (ref 30.0–36.0)
MCV: 93.5 fL (ref 80.0–100.0)
MCV: 94.5 fL (ref 80.0–100.0)
Platelets: 300 10*3/uL (ref 150–400)
Platelets: 273 10*3/uL (ref 150–400)
RBC: 4.43 MIL/uL (ref 3.87–5.11)
RBC: 4.16 MIL/uL (ref 3.87–5.11)
RDW: 12.4 % (ref 11.5–15.5)
RDW: 12.5 % (ref 11.5–15.5)
WBC: 7.2 10*3/uL (ref 4.0–10.5)
WBC: 7.9 10*3/uL (ref 4.0–10.5)
nRBC: 0 % (ref 0.0–0.2)
nRBC: 0 % (ref 0.0–0.2)

## 2018-12-04 LAB — HEPATITIS PANEL, ACUTE
HCV Ab: NONREACTIVE
Hep A IgM: NONREACTIVE
Hep B C IgM: NONREACTIVE
Hepatitis B Surface Ag: NONREACTIVE

## 2018-12-04 LAB — URINALYSIS, ROUTINE W REFLEX MICROSCOPIC
Bilirubin Urine: NEGATIVE
Glucose, UA: NEGATIVE mg/dL
Ketones, ur: 40 mg/dL — AB
Leukocytes,Ua: NEGATIVE
Nitrite: NEGATIVE
Protein, ur: NEGATIVE mg/dL
Specific Gravity, Urine: 1.015 (ref 1.005–1.030)
pH: 6.5 (ref 5.0–8.0)

## 2018-12-04 LAB — URINALYSIS, MICROSCOPIC (REFLEX): Bacteria, UA: NONE SEEN

## 2018-12-04 LAB — LIPASE, BLOOD: Lipase: 38 U/L (ref 11–51)

## 2018-12-04 LAB — PROTIME-INR
INR: 1.1 (ref 0.8–1.2)
Prothrombin Time: 13.9 seconds (ref 11.4–15.2)

## 2018-12-04 LAB — SARS CORONAVIRUS 2 BY RT PCR (HOSPITAL ORDER, PERFORMED IN ~~LOC~~ HOSPITAL LAB): SARS Coronavirus 2: NEGATIVE

## 2018-12-04 LAB — FIBRINOGEN: Fibrinogen: 295 mg/dL (ref 210–475)

## 2018-12-04 LAB — APTT: aPTT: 36 seconds (ref 24–36)

## 2018-12-04 MED ORDER — WITCH HAZEL-GLYCERIN EX PADS: 1.0000 "application " | MEDICATED_PAD | CUTANEOUS | Status: DC | PRN

## 2018-12-04 MED ORDER — HYDRALAZINE HCL 20 MG/ML IJ SOLN: 10.0000 mg | INTRAMUSCULAR | Status: DC | PRN

## 2018-12-04 MED ORDER — HYDRALAZINE HCL 20 MG/ML IJ SOLN: 5.0000 mg | INTRAMUSCULAR | Status: DC | PRN

## 2018-12-04 MED ORDER — LABETALOL HCL 5 MG/ML IV SOLN: 40.0000 mg | INTRAVENOUS | Status: DC | PRN

## 2018-12-04 MED ORDER — COCONUT OIL OIL: 1.0000 "application " | TOPICAL_OIL | Status: DC | PRN

## 2018-12-04 MED ORDER — HYDRALAZINE HCL 20 MG/ML IJ SOLN: 10.0000 mg | Freq: Once | INTRAMUSCULAR | Status: DC

## 2018-12-04 MED ORDER — ONDANSETRON HCL 4 MG PO TABS: 4.0000 mg | ORAL_TABLET | ORAL | Status: DC | PRN

## 2018-12-04 MED ORDER — LABETALOL HCL 5 MG/ML IV SOLN: 20.0000 mg | INTRAVENOUS | Status: DC | PRN

## 2018-12-04 MED ORDER — DOCUSATE SODIUM 100 MG PO CAPS: 100.0000 mg | ORAL_CAPSULE | Freq: Two times a day (BID) | ORAL | Status: DC | PRN

## 2018-12-04 MED ORDER — SODIUM CHLORIDE 0.9% FLUSH: 3.0000 mL | Freq: Once | INTRAVENOUS | Status: DC

## 2018-12-04 MED ORDER — SIMETHICONE 80 MG PO CHEW: 80.0000 mg | CHEWABLE_TABLET | ORAL | Status: DC | PRN

## 2018-12-04 MED ORDER — PRENATAL MULTIVITAMIN CH
1.0000 | ORAL_TABLET | Freq: Every day | ORAL | Status: DC
  Administered 2018-12-05: 1 via ORAL
  Filled 2018-12-04 (×2): qty 1

## 2018-12-04 MED ORDER — BENZOCAINE-MENTHOL 20-0.5 % EX AERO: 1.0000 "application " | INHALATION_SPRAY | CUTANEOUS | Status: DC | PRN

## 2018-12-04 MED ORDER — DIPHENHYDRAMINE HCL 25 MG PO CAPS: 25.0000 mg | ORAL_CAPSULE | Freq: Four times a day (QID) | ORAL | Status: DC | PRN

## 2018-12-04 MED ORDER — ONDANSETRON HCL 4 MG/2ML IJ SOLN: 4.0000 mg | INTRAMUSCULAR | Status: DC | PRN

## 2018-12-04 MED ORDER — MAGNESIUM SULFATE BOLUS VIA INFUSION
4.0000 g | Freq: Once | INTRAVENOUS | Status: AC
  Administered 2018-12-04: 4 g via INTRAVENOUS
  Filled 2018-12-04: qty 1000

## 2018-12-04 MED ORDER — LACTATED RINGERS IV SOLN
INTRAVENOUS | Status: DC
  Administered 2018-12-04 – 2018-12-05 (×2): via INTRAVENOUS

## 2018-12-04 MED ORDER — NIFEDIPINE ER OSMOTIC RELEASE 30 MG PO TB24
30.0000 mg | ORAL_TABLET | Freq: Every day | ORAL | Status: DC
  Administered 2018-12-04 – 2018-12-06 (×3): 30 mg via ORAL
  Filled 2018-12-04 (×3): qty 1

## 2018-12-04 MED ORDER — MAGNESIUM SULFATE 40 GM/1000ML IV SOLN
2.0000 g/h | INTRAVENOUS | Status: AC
  Administered 2018-12-04 – 2018-12-05 (×2): 2 g/h via INTRAVENOUS
  Filled 2018-12-04 (×2): qty 1000

## 2018-12-04 MED ORDER — IBUPROFEN 600 MG PO TABS
600.0000 mg | ORAL_TABLET | Freq: Four times a day (QID) | ORAL | Status: DC | PRN
  Administered 2018-12-05 (×3): 600 mg via ORAL
  Filled 2018-12-04 (×3): qty 1

## 2018-12-04 MED ORDER — DIBUCAINE (PERIANAL) 1 % EX OINT: 1.0000 "application " | TOPICAL_OINTMENT | CUTANEOUS | Status: DC | PRN

## 2018-12-04 NOTE — ED Notes (Signed)
Patient transported to Ultrasound 

## 2018-12-04 NOTE — ED Notes (Signed)
Called Lab to confirm that UA and Urine Culture were received.

## 2018-12-04 NOTE — ED Provider Notes (Signed)
Care assumed from [place] Robinson, please see her note for full details, but in brief Casey Mcmahon is a 33 y.o. female who is 2 weeks postpartum after spontaneous vaginal delivery and laparoscopic tubal ligation, presenting for evaluation of right upper quadrant abdominal pain which started last night after dinner.  She reports associated episode of vomiting.  No fevers.  Pain has improved today here in the ED.  Lab evaluation with elevated LFTs but no leukocytosis or other lab abnormalities.  Right upper quadrant ultrasound pending at shift change.  Plan: f/u RUQ ultrasound, suspect patient will have some gallbladder pathology, has not required any pain meds here  Physical Exam  BP (!) 145/99   Pulse 65   Temp 98 F (36.7 C) (Oral)   Resp 20   LMP 01/19/2018 (Exact Date)   SpO2 100%   Physical Exam Vitals signs and nursing note reviewed.  Constitutional:      General: She is not in acute distress.    Appearance: She is well-developed. She is not ill-appearing or diaphoretic.  HENT:     Head: Normocephalic and atraumatic.  Eyes:     General:        Right eye: No discharge.        Left eye: No discharge.  Pulmonary:     Effort: Pulmonary effort is normal. No respiratory distress.  Abdominal:     General: Abdomen is flat.     Palpations: Abdomen is soft.     Tenderness: There is no abdominal tenderness.     Comments: Abdomen soft, nondistended, bowel sounds present throughout, no focal tenderness in the right upper quadrant.  Skin:    General: Skin is warm and dry.  Neurological:     Mental Status: She is alert and oriented to person, place, and time.     Coordination: Coordination normal.  Psychiatric:        Behavior: Behavior normal.     ED Course/Procedures   Labs Reviewed  COMPREHENSIVE METABOLIC PANEL - Abnormal; Notable for the following components:      Result Value   CO2 21 (*)    Glucose, Bld 118 (*)    AST 278 (*)    ALT 192 (*)    Total Bilirubin 1.3  (*)    All other components within normal limits  URINALYSIS, ROUTINE W REFLEX MICROSCOPIC - Abnormal; Notable for the following components:   Hgb urine dipstick TRACE (*)    Ketones, ur 40 (*)    All other components within normal limits  SARS CORONAVIRUS 2 BY RT PCR (HOSPITAL ORDER, PERFORMED IN [facility])  SARS CORONAVIRUS 2 (TAT 6-24 HRS)  LIPASE, BLOOD  CBC  FIBRINOGEN  PROTIME-INR  APTT  CBC  URINALYSIS, MICROSCOPIC (REFLEX)  HEPATITIS PANEL, ACUTE  PROTEIN / CREATININE RATIO, URINE  COMPREHENSIVE METABOLIC PANEL  TYPE AND SCREEN   US Abdomen Limited Ruq  Result Date: 12/04/2018 CLINICAL DATA:  Right upper quadrant pain for 1 EXAM: ULTRASOUND ABDOMEN LIMITED RIGHT UPPER QUADRANT COMPARISON:  None. FINDINGS: Gallbladder: Probable small amount of sludge within the gallbladder. No gallstones or wall thickening visualized. No sonographic Murphy sign noted by sonographer. Common bile duct: Diameter: 0.6 cm, within normal limits. Liver: No focal lesion identified. Within normal limits in parenchymal echogenicity. Portal vein is patent on color Doppler imaging with normal direction of blood flow towards the liver. Other: None. IMPRESSION: Probable mild gallbladder sludge without other signs of acute inflammation. Electronically Signed   By:  Audie Pinto M.D.   On: 12/04/2018 16:26    .Critical Care Performed by: Jacqlyn Larsen, PA-C Authorized by: Jacqlyn Larsen, PA-C   Critical care provider statement:    Critical care time (minutes):  45   Critical care was necessary to treat or prevent imminent or life-threatening deterioration of the following conditions: Postpartum preeclampsia with early HELLP syndrome.   Critical care was time spent personally by me on the following activities:  Discussions with consultants, evaluation of patient's response to treatment, examination of patient, ordering and performing treatments and interventions, ordering and review of  laboratory studies, ordering and review of radiographic studies, pulse oximetry, re-evaluation of patient's condition, obtaining history from patient or surrogate and review of old charts    MDM   Patient's ultrasound has returned it shows mild biliary sludge but no signs of acute inflammation, no gallstones, normal diameter of the common bile duct, no signs of acute cholecystitis.  Patient with elevated LFTs but no leukocytosis and on exam she has no right upper quadrant pain symptoms have resolved.  Case discussed with Dr. Kieth Brightly who feels this is likely symptomatic biliary colic, without leukocytosis or tenderness on exam does not think that she needs emergent cholecystectomy, recommend symptomatic management and outpatient surgery follow-up.  In reviewing the rest of patient's lab work and history she is noted to be hypertensive throughout her ED stay, patient is 2 weeks postpartum, reports that she had elevated blood pressures throughout her pregnancy and was started on medication for pregnancy-induced hypertension, was on watch for but did not develop preeclampsia.  In the setting of elevated blood pressures concerning that right upper quadrant pain and elevated LFTs could be a sign of postpartum preeclampsia.  Patient is not having any headaches vision changes or neurologic symptoms but will discuss with on-call OB/GYN.  Case discussed with Dr. Ilda Basset who is also concerned that this could be preeclampsia, with elevated LFTs concern for HELLP syndrome, Dr. Ilda Basset will order additional lab evaluation for the patient and start her on magnesium for seizure prophylaxis, patient will be admitted to [facility] hospital for further evaluation and observation of postpartum preeclampsia.  Final diagnoses:  RUQ abdominal pain  Preeclampsia in postpartum period  Elevated LFTs  Biliary sludge          Jacqlyn Larsen, PA-C 12/04/18 1915    Tegeler, Gwenyth Allegra, MD 12/04/18 [phone]

## 2018-12-04 NOTE — ED Triage Notes (Signed)
Rt sided abd pain x 2 weeks after giving birth  States o n and off  , some n/v she states denies dysuria,

## 2018-12-04 NOTE — H&P (Signed)
Obstetrics & Gynecology H&P   Date of Admission: 12/04/2018   Requesting Provider: [facility]  Primary OBGYN: [facility] Primary Care Provider: Alysia PennaHolwerda, Scott  Reason for Admission: concern for PP atypical HELLP  History of Present Illness: [name] is a 33 y.o. [id number] s/p 10/8 SVD and ppBTL @ 38wks after IOL for 6/10 BPP. PMHx significant for cHTN, GDMa2, AMA, BMI 30s, h/o asthma and migraines.  Patient states she's had a few episodes of RUQ pain PP and called the clinic and was given some behavioral interventions to help with it but last night into this morning it was worse with some nausea and vomiting so she came to the hospital for evaluation. Patient in the ED and had elevated AST/ALT, with CBC that showed an H/H of 13.9 and 41 vs 10.8/32.6 on 10/8. plts are 300k and u/a is pending as of this note. RUQ u/s just showed ?sludge and per ED, Gen Surg not sure why for AST/ALT elevation  I was called for evaluation.  Patient states she's had mild HAs that was relieved with apap and currently has none and never any visual changes, chest pain, sob and currently has no ruq pain or nausea. She has none to scant lochia. She states she didn't take her lisinopril and hctz this morning b/c she was in the ED.   ROS: A 12-point review of systems was performed and negative, except as stated in the above HPI.  OBGYN History: As per HPI. OB History  Gravida Para Term Preterm AB Living  4 3 3  0 1 3  SAB TAB Ectopic Multiple Live Births  0 1 0 0 3    # Outcome Date GA Lbr Len/2nd Weight Sex Delivery Anes PTL Lv  4 Term 11/16/18 717w3d 02:55 / 00:37 3436 g M Vag-Spont EPI  LIV  3 Term 06/29/05 4955w0d  2948 g F Vag-Spont   LIV  2 TAB 2005          1 Term 02/12/01 6957w0d  3856 g F Vag-Spont   LIV   Past Medical History: Past Medical History:  Diagnosis Date  . Asthma    prn inhaler  . Asthma, moderate persistent 04/17/2018  . Derangement of posterior horn of medial meniscus 01/2014   left  . GERD  (gastroesophageal reflux disease)    no current med.  . Gestational diabetes   . Hypertension    under control with med., has been on med. x 3 yr.  . Left ACL tear 01/2014  . Migraines    occasional  . Seasonal allergies    current nasal congestion (01/24/2014)  . Supervision of other normal pregnancy, antepartum 04/17/2018    Past Surgical History: Past Surgical History:  Procedure Laterality Date  . KNEE ARTHROSCOPY WITH ANTERIOR CRUCIATE LIGAMENT (ACL) REPAIR Left 01/28/2014   Procedure: LEFT KNEE ARTHROSCOPY WITH MEDIAL MENISCECTOMY AND ANTERIOR CRUCIATE LIGAMENT REPAIR;  Surgeon: Loreta Aveaniel F Murphy, MD;  Location: [facility];  Service: Orthopedics;  Laterality: Left;  . ORIF ANKLE FRACTURE Left 07/19/2013   Procedure: LEFT ANKLE FRACTURE OPEN TREATMENT BIMALLEOLAR ANKLE INCLUDES INTERNAL FIXATION, LEFT ANKLE FRACTURE OPEN TREATMENT DISTAL TIBIOFIBULAR fasciotomy INCLUDES INTERNAL FIXATION repai of synosmosis;  Surgeon: Loreta Aveaniel F Murphy, MD;  Location: [facility];  Service: Orthopedics;  Laterality: Left;  . TUBAL LIGATION N/A 11/16/2018   Procedure: POST PARTUM TUBAL LIGATION;  Surgeon: Reva BoresPratt, Tanya S, MD;  Location: [facility];  Service: Gynecology;  Laterality: N/A;    Family History:  Family History  Problem Relation Age of Onset  . Diabetes Maternal Grandfather   . Diabetes Mother   . Alcoholism Mother   . Diabetes Father   . Hypertension Father   . Epilepsy Daughter   . Alzheimer's disease Maternal Grandmother   . Glaucoma Maternal Grandmother    Social History:  Social History   Socioeconomic History  . Marital status: Married    Spouse name: Not on file  . Number of children: Not on file  . Years of education: Not on file  . Highest education level: Not on file  Occupational History  . Not on file  Social Needs  . Financial resource strain: Not hard at all  . Food insecurity    Worry: Never true    Inability: Never true  .  Transportation needs    Medical: No    Non-medical: No  Tobacco Use  . Smoking status: Never Smoker  . Smokeless tobacco: Never Used  Substance and Sexual Activity  . Alcohol use: Not Currently    Comment: socially  . Drug use: No  . Sexual activity: Yes    Birth control/protection: None  Lifestyle  . Physical activity    Days per week: Not on file    Minutes per session: Not on file  . Stress: Not on file  Relationships  . Social Herbalist on phone: Not on file    Gets together: Not on file    Attends religious service: Not on file    Active member of club or organization: Not on file    Attends meetings of clubs or organizations: Not on file    Relationship status: Not on file  . Intimate partner violence    Fear of current or ex partner: Not on file    Emotionally abused: Not on file    Physically abused: Not on file    Forced sexual activity: Not on file  Other Topics Concern  . Not on file  Social History Narrative  . Not on file   Allergy: No Known Allergies  Current Outpatient Medications: (Not in a hospital admission)    Hospital Medications: Current Facility-Administered Medications  Medication Dose Route Frequency Provider Last Rate Last Dose  . sodium chloride flush (NS) 0.9 % injection 3 mL  3 mL Intravenous Once Fredia Sorrow, MD       Current Outpatient Medications  Medication Sig Dispense Refill  . acetaminophen (TYLENOL) 500 MG tablet Take 1,000 mg by mouth every 6 (six) hours as needed for mild pain.    Marland Kitchen aspirin EC 81 MG tablet Take 1 tablet (81 mg total) by mouth daily. Start on 05/15/2018 or later 90 tablet 3  . diphenhydrAMINE (BENADRYL) 25 MG tablet Take 25 mg by mouth every 6 (six) hours as needed.    . fluticasone (FLONASE) 50 MCG/ACT nasal spray Place 1 spray into both nostrils daily.     . hydrochlorothiazide (HYDRODIURIL) 25 MG tablet Take 1 tablet (25 mg total) by mouth daily. 30 tablet 1  . lisinopril (ZESTRIL) 20 MG  tablet Take 1 tablet (20 mg total) by mouth daily. 30 tablet 1  . oxyCODONE (ROXICODONE) 5 MG immediate release tablet Take 1 tablet (5 mg total) by mouth every 8 (eight) hours as needed. 20 tablet 0  . PRENATAL 27-1 MG TABS Take 1 tablet by mouth daily. 30 each 9  . promethazine (PHENERGAN) 12.5 MG tablet Take 1 tablet (12.5 mg total) by mouth every 6 (  six) hours as needed for nausea or vomiting. 30 tablet 0     Physical Exam:   Current Vital Signs 24h Vital Sign Ranges  T 98 F (36.7 C) Temp  Avg: 98.2 F (36.8 C)  Min: 98 F (36.7 C)  Max: 98.4 F (36.9 C)  BP (!) 162/93 BP  Min: 132/84  Max: 162/93  HR 68 Pulse  Avg: 79  Min: 65  Max: 98  RR 20 Resp  Avg: 19.5  Min: 16  Max: 22  SaO2 98 % Room Air SpO2  Avg: 98.9 %  Min: 97 %  Max: 100 %       24 Hour I/O Current Shift I/O  Time Ins Outs No intake/output data recorded. No intake/output data recorded.   Patient Vitals for the past 24 hrs:  BP Temp Temp src Pulse Resp SpO2  12/04/18 1800 (!) 162/93 - - 68 - 98 %  12/04/18 1745 (!) 152/90 - - 74 - 97 %  12/04/18 1600 (!) 145/99 - - 65 20 100 %  12/04/18 1545 (!) 148/97 - - 80 - 99 %  12/04/18 1515 (!) 145/101 - - 83 - 98 %  12/04/18 1242 (!) 155/83 98 F (36.7 C) Oral 98 (!) 22 99 %  12/04/18 1045 132/84 98.3 F (36.8 C) Oral 77 16 100 %  12/04/18 0810 (!) 160/100 98.4 F (36.9 C) Oral 87 20 100 %    There is no height or weight on file to calculate BMI. General appearance: Well nourished, well developed female in no acute distress.  Cardiovascular: S1, S2 normal, no murmur, rub or gallop, regular rate and rhythm Respiratory:  Clear to auscultation bilateral. Normal respiratory effort Abdomen: positive bowel sounds and no masses, hernias; diffusely non tender to palpation, non distended Neuro/Psych:  Normal mood and affect.  Skin:  Warm and dry.  Extremities: no clubbing, cyanosis, or edema.   Laboratory: Pending: U/A Lipase: normal  Recent Labs  Lab  12/04/18 0830  WBC 7.2  HGB 13.9  HCT 41.4  PLT 300   Recent Labs  Lab 12/04/18 0830  NA 137  K 4.3  CL 105  CO2 21*  BUN 7  CREATININE 0.92  CALCIUM 9.5  PROT 7.8  BILITOT 1.3*  ALKPHOS 106  ALT 192*  AST 278*  GLUCOSE 118*    Imaging:  EXAM: ULTRASOUND ABDOMEN LIMITED RIGHT UPPER QUADRANT  COMPARISON:  None.  FINDINGS: Gallbladder:  Probable small amount of sludge within the gallbladder. No gallstones or wall thickening visualized. No sonographic Murphy sign noted by sonographer.  Common bile duct:  Diameter: 0.6 cm, within normal limits.  Liver:  No focal lesion identified. Within normal limits in parenchymal echogenicity. Portal vein is patent on color Doppler imaging with normal direction of blood flow towards the liver.  Other: None.  IMPRESSION: Probable mild gallbladder sludge without other signs of acute inflammation.   Electronically Signed   By: Emmaline Kluver M.D.   On: 12/04/2018 16:26  Assessment: pt stable, concern for PP atypical HELLP  Plan: Admit to antepartum *PP: breast pump. No acute concerns *Atypical HELLP: repeat labs and coags, fibrinogen added to blood work. Will start Mg at 4gm bolus and 2gm/hr with MIVF x 24h. L&D RN to come to ED to help with them on this. Will start on procardia xl 30 qday and IV PRNs ordered.  *GDMa2: f/u rpt cmp. Pt currently on no meds PP.  *FEN/GI: MIVF on Mg *PPx: SCDs, if coags  okay. Can do lovenox *Pain: no current issues. Motrin prn *Dispo: see above  Total time taking care of the patient was 20 minutes, with greater than 50% of the time spent in face to face interaction with the patient.  Cornelia Copa MD Attending Center for [facility])

## 2018-12-04 NOTE — ED Notes (Signed)
Pt transported to [facility] for admission. Report has been called

## 2018-12-04 NOTE — ED Provider Notes (Signed)
[facility] Provider Note   CSN: [id number] Arrival date & time: 12/04/18  0756     History   Chief Complaint Chief Complaint  Patient presents with  . Abdominal Pain  . Emesis    HPI Casey Mcmahon is a 33 y.o. female with past medical history of GERD, hypertension, 2 weeks postpartum vaginal delivery on 11/16/2018, presenting to the emergency department with complaint of remittent right upper quadrant abdominal pain over the last couple of days.  Pain is described as a dull aching pain to her right upper quadrant radiating to her right back.  She states her symptoms are worse after dinner and yesterday she experienced associated nausea and vomiting.  She is treated her symptoms with over-the-counter medications including oxycodone which she got for her postpartum recovery.  Patient is not currently having pain on evaluation.  No associated fevers, urinary symptoms, diarrhea or constipation.  She reports an uncomplicated vaginal every, her vaginal bleeding has significantly improved.  She did have laparoscopic tubal ligation and has been feeling well.  She is currently breast-feeding. No known history of cholelithiasis.     The history is provided by the patient.    Past Medical History:  Diagnosis Date  . Asthma    prn inhaler  . Asthma, moderate persistent 04/17/2018  . Derangement of posterior horn of medial meniscus 01/2014   left  . GERD (gastroesophageal reflux disease)    no current med.  . Gestational diabetes   . Hypertension    under control with med., has been on med. x 3 yr.  . Left ACL tear 01/2014  . Migraines    occasional  . Seasonal allergies    current nasal congestion (01/24/2014)  . Supervision of other normal pregnancy, antepartum 04/17/2018    Patient Active Problem List   Diagnosis Date Noted  . Vaginal delivery 11/16/2018  . Chronic hypertension affecting pregnancy 11/15/2018  . Unwanted fertility 10/23/2018   . Obesity affecting pregnancy, antepartum 10/02/2018  . GDM (gestational diabetes mellitus) 09/11/2018  . PCOS (polycystic ovarian syndrome) 04/17/2018  . Supervision of high risk pregnancy, antepartum 04/17/2018  . Hypertension in pregnancy, antepartum 04/17/2018  . GERD (gastroesophageal reflux disease) 04/17/2018  . Asthma, mild intermittent 04/17/2018  . AMA (advanced maternal age) multigravida 35+ 04/17/2018  . Migraines   . Obesity 02/02/2018  . High blood pressure 02/02/2018    Past Surgical History:  Procedure Laterality Date  . KNEE ARTHROSCOPY WITH ANTERIOR CRUCIATE LIGAMENT (ACL) REPAIR Left 01/28/2014   Procedure: LEFT KNEE ARTHROSCOPY WITH MEDIAL MENISCECTOMY AND ANTERIOR CRUCIATE LIGAMENT REPAIR;  Surgeon: Loreta Aveaniel F Murphy, MD;  Location: [facility];  Service: Orthopedics;  Laterality: Left;  . ORIF ANKLE FRACTURE Left 07/19/2013   Procedure: LEFT ANKLE FRACTURE OPEN TREATMENT BIMALLEOLAR ANKLE INCLUDES INTERNAL FIXATION, LEFT ANKLE FRACTURE OPEN TREATMENT DISTAL TIBIOFIBULAR fasciotomy INCLUDES INTERNAL FIXATION repai of synosmosis;  Surgeon: Loreta Aveaniel F Murphy, MD;  Location: [facility];  Service: Orthopedics;  Laterality: Left;  . TUBAL LIGATION N/A 11/16/2018   Procedure: POST PARTUM TUBAL LIGATION;  Surgeon: Reva BoresPratt, Tanya S, MD;  Location: [facility];  Service: Gynecology;  Laterality: N/A;     OB History    Gravida  4   Para  3   Term  3   Preterm  0   AB  1   Living  3     SAB  0   TAB  1   Ectopic  0   Multiple  0   Live Births  3            Home Medications    Prior to Admission medications   Medication Sig Start Date End Date Taking? Authorizing Provider  acetaminophen (TYLENOL) 500 MG tablet Take 1,000 mg by mouth every 6 (six) hours as needed for mild pain.    [provider]  aspirin EC 81 MG tablet Take 1 tablet (81 mg total) by mouth daily. Start on 05/15/2018 or later 04/17/18   Reva Bores, MD   diphenhydrAMINE (BENADRYL) 25 MG tablet Take 25 mg by mouth every 6 (six) hours as needed.    [provider]  fluticasone (FLONASE) 50 MCG/ACT nasal spray Place 1 spray into both nostrils daily.     [provider]  hydrochlorothiazide (HYDRODIURIL) 25 MG tablet Take 1 tablet (25 mg total) by mouth daily. 11/18/18   Sparacino, Hailey L, DO  lisinopril (ZESTRIL) 20 MG tablet Take 1 tablet (20 mg total) by mouth daily. 11/18/18   Sparacino, Hailey L, DO  oxyCODONE (ROXICODONE) 5 MG immediate release tablet Take 1 tablet (5 mg total) by mouth every 8 (eight) hours as needed. 11/18/18 11/18/19  Sparacino, Hailey L, DO  PRENATAL 27-1 MG TABS Take 1 tablet by mouth daily. 03/30/18   Adam Phenix, MD  promethazine (PHENERGAN) 12.5 MG tablet Take 1 tablet (12.5 mg total) by mouth every 6 (six) hours as needed for nausea or vomiting. 06/06/18   Anyanwu, Jethro Bastos, MD    Family History Family History  Problem Relation Age of Onset  . Diabetes Maternal Grandfather   . Diabetes Mother   . Alcoholism Mother   . Diabetes Father   . Hypertension Father   . Epilepsy Daughter   . Alzheimer's disease Maternal Grandmother   . Glaucoma Maternal Grandmother     Social History Social History   Tobacco Use  . Smoking status: Never Smoker  . Smokeless tobacco: Never Used  Substance Use Topics  . Alcohol use: Not Currently    Comment: socially  . Drug use: No     Allergies   Patient has no known allergies.   Review of Systems Review of Systems  Gastrointestinal: Positive for abdominal pain, nausea and vomiting.  All other systems reviewed and are negative.    Physical Exam Updated Vital Signs BP (!) 145/99   Pulse 65   Temp 98 F (36.7 C) (Oral)   Resp 20   LMP 01/19/2018 (Exact Date)   SpO2 100%   Physical Exam Vitals signs and nursing note reviewed.  Constitutional:      General: She is not in acute distress.    Appearance: She is well-developed. She is not  ill-appearing.  HENT:     Head: Normocephalic and atraumatic.  Eyes:     Conjunctiva/sclera: Conjunctivae normal.  Cardiovascular:     Rate and Rhythm: Normal rate and regular rhythm.  Pulmonary:     Effort: Pulmonary effort is normal. No respiratory distress.     Breath sounds: Normal breath sounds.  Abdominal:     General: Bowel sounds are normal.     Palpations: Abdomen is soft.     Tenderness: There is no abdominal tenderness. There is no guarding or rebound. Negative signs include Murphy's sign.     Comments: There is a well approximated well-healing surgical wound just below the umbilicus.  No drainage or surrounding redness or warmth.  Skin:    General:  Skin is warm.  Neurological:     Mental Status: She is alert.  Psychiatric:        Behavior: Behavior normal.      ED Treatments / Results  Labs (all labs ordered are listed, but only abnormal results are displayed) Labs Reviewed  COMPREHENSIVE METABOLIC PANEL - Abnormal; Notable for the following components:      Result Value   CO2 21 (*)    Glucose, Bld 118 (*)    AST 278 (*)    ALT 192 (*)    Total Bilirubin 1.3 (*)    All other components within normal limits  LIPASE, BLOOD  CBC  URINALYSIS, ROUTINE W REFLEX MICROSCOPIC    EKG None  Radiology US Abdomen Limited Ruq  Result Date: 12/04/2018 CLINICAL DATA:  Right upper quadrant pain for 1 EXAM: ULTRASOUND ABDOMEN LIMITED RIGHT UPPER QUADRANT COMPARISON:  None. FINDINGS: Gallbladder: Probable small amount of sludge within the gallbladder. No gallstones or wall thickening visualized. No sonographic Murphy sign noted by sonographer. Common bile duct: Diameter: 0.6 cm, within normal limits. Liver: No focal lesion identified. Within normal limits in parenchymal echogenicity. Portal vein is patent on color Doppler imaging with normal direction of blood flow towards the liver. Other: None. IMPRESSION: Probable mild gallbladder sludge without other signs of acute  inflammation. Electronically Signed   By: Audie Pinto M.D.   On: 12/04/2018 16:26    Procedures Procedures (including critical care time)  Medications Ordered in ED Medications  sodium chloride flush (NS) 0.9 % injection 3 mL (3 mLs Intravenous Not Given 12/04/18 1451)     Initial Impression / Assessment and Plan / ED Course  I have reviewed the triage vital signs and the nursing notes.  Pertinent labs & imaging results that were available during my care of the patient were reviewed by me and considered in my medical decision making (see chart for details).  Clinical Course as of Dec 04 1735  Mon Dec 04, 2018  1525 Last meal last night 9:30-10pm, drink 2-3 am   [JR]  1712 ALT(!): 192 [KF]    Clinical Course User Index [JR] Robinson, [place] N, PA-C [KF] Jacqlyn Larsen, [place]       Patient presenting with intermittent right upper quadrant abdominal pain with associated nausea and vomiting.  Symptoms seem to be present prandial in nature.  She is noted to be 2 weeks postpartum after an uncomplicated vaginal delivery and laparoscopic tubal ligation.  She is having no lower abdominal pain or tenderness and her vaginal bleeding has significantly improved.  She is afebrile and in no distress on evaluation.  Abdomen is soft and nontender.  Her surgical scar appears to be healing well.  Labs do reveal an acute elevation in her liver enzymes and T bili.  No leukocytosis.  Lipase is within normal limits.  Given acute elevation in liver enzymes and T bili, proceeded with right upper quadrant ultrasound to evaluate for cholelithiasis versus choledocholithiasis.  Not currently requiring any symptomatic management at this time.  Pending right upper quadrant ultrasound, care assumed at shift change by PA Ford to follow-up imaging and determine disposition.  Final Clinical Impressions(s) / ED Diagnoses   Final diagnoses:  RUQ abdominal pain  Elevated LFTs    ED Discharge Orders    None        Robinson, [place] N, PA-C 12/04/18 1740    Fredia Sorrow, MD 12/14/18 [phone]

## 2018-12-04 NOTE — Discharge Instructions (Addendum)
Hypertension During Pregnancy °High blood pressure (hypertension) is when the force of blood pumping through the arteries is too strong. Arteries are blood vessels that carry blood from the heart throughout the body. Hypertension during pregnancy can be mild or severe. Severe hypertension during pregnancy (preeclampsia) is a medical emergency that requires prompt evaluation and treatment. °Different types of hypertension can happen during pregnancy. These include: °· Chronic hypertension. This happens when you had high blood pressure before you became pregnant, and it continues during the pregnancy. Hypertension that develops before you are [redacted] weeks pregnant and continues during the pregnancy is also called chronic hypertension. If you have chronic hypertension, it will not go away after you have your baby. You will need follow-up visits with your health care provider after you have your baby. Your doctor may want you to keep taking medicine for your blood pressure. °· Gestational hypertension. This is hypertension that develops after the 20th week of pregnancy. Gestational hypertension usually goes away after you have your baby, but your health care provider will need to monitor your blood pressure to make sure that it is getting better. °· Preeclampsia. This is severe hypertension during pregnancy. This can cause serious complications for you and your baby and can also cause complications for you after the delivery of your baby. °· Postpartum preeclampsia. You may develop severe hypertension after giving birth. This usually occurs within 48 hours after childbirth but may occur up to 6 weeks after giving birth. This is rare. °How does this affect me? °Women who have hypertension during pregnancy have a greater chance of developing hypertension later in life or during future pregnancies. In some cases, hypertension during pregnancy can cause serious complications, such as: °· Stroke. °· Heart attack. °· Injury to  other organs, such as kidneys, lungs, or liver. °· Preeclampsia. °· Convulsions or seizures. °· Placental abruption. °How does this affect my baby? °Hypertension during pregnancy can affect your baby. Your baby may: °· Be born early (prematurely). °· Not weigh as much as he or she should at birth (low birth weight). °· Not tolerate labor well, leading to an unplanned cesarean delivery. °What are the risks? °There are certain factors that make it more likely for you to develop hypertension during pregnancy. These include: °· Having hypertension during a previous pregnancy. °· Being overweight. °· Being age 35 or older. °· Being pregnant for the first time. °· Being pregnant with more than one baby. °· Becoming pregnant using fertilization methods, such as IVF (in vitro fertilization). °· Having other medical problems, such as diabetes, kidney disease, or lupus. °· Having a family history of hypertension. °What can I do to lower my risk? °The exact cause of hypertension during pregnancy is not known. You may be able to lower your risk by: °· Maintaining a healthy weight. °· Eating a healthy and balanced diet. °· Following your health care provider's instructions about treating any long-term conditions that you had before becoming pregnant. °It is very important to keep all of your prenatal care appointments. Your health care provider will check your blood pressure and make sure that your pregnancy is progressing as expected. If a problem is found, early treatment can prevent complications. °How is this treated? °Treatment for hypertension during pregnancy varies depending on the type of hypertension you have and how serious it is. °· If you were taking medicine for high blood pressure before you became pregnant, talk with your health care provider. You may need to change medicine during pregnancy because   some medicines, like ACE inhibitors, may not be considered safe for your baby. °· If you have gestational  hypertension, your health care provider may order medicine to treat this during pregnancy. °· If you are at risk for preeclampsia, your health care provider may recommend that you take a low-dose aspirin during your pregnancy. °· If you have severe hypertension, you may need to be hospitalized so you and your baby can be monitored closely. You may also need to be given medicine to lower your blood pressure. This medicine may be given by mouth or through an IV. °· In some cases, if your condition gets worse, you may need to deliver your baby early. °Follow these instructions at home: °Eating and drinking ° °· Drink enough fluid to keep your urine pale yellow. °· Avoid caffeine. °Lifestyle °· Do not use any products that contain nicotine or tobacco, such as cigarettes, e-cigarettes, and chewing tobacco. If you need help quitting, ask your health care provider. °· Do not use alcohol or drugs. °· Avoid stress as much as possible. °· Rest and get plenty of sleep. °· Regular exercise can help to reduce your blood pressure. Ask your health care provider what kinds of exercise are best for you. °General instructions °· Take over-the-counter and prescription medicines only as told by your health care provider. °· Keep all prenatal and follow-up visits as told by your health care provider. This is important. °Contact a health care provider if: °· You have symptoms that your health care provider told you may require more treatment or monitoring, such as: °? Headaches. °? Nausea or vomiting. °? Abdominal pain. °? Dizziness. °? Light-headedness. °Get help right away if: °· You have: °? Severe abdominal pain that does not get better with treatment. °? A severe headache that does not get better. °? Vomiting that does not get better. °? Sudden, rapid weight gain. °? Sudden swelling in your hands, ankles, or face. °? Vaginal bleeding. °? Blood in your urine. °? Blurred or double vision. °? Shortness of breath or chest  pain. °? Weakness on one side of your body. °? Difficulty speaking. °· Your baby is not moving as much as usual. °Summary °· High blood pressure (hypertension) is when the force of blood pumping through the arteries is too strong. °· Hypertension during pregnancy can cause problems for you and your baby. °· Treatment for hypertension during pregnancy varies depending on the type of hypertension you have and how serious it is. °· Keep all prenatal and follow-up visits as told by your health care provider. This is important. °This information is not intended to replace advice given to you by your health care provider. Make sure you discuss any questions you have with your health care provider. °Document Released: 10/13/2010 Document Revised: 05/18/2018 Document Reviewed: 02/21/2018 °[employer] Patient Education © 2020 [employer] ° °

## 2018-12-05 DIAGNOSIS — O1425 HELLP syndrome, complicating the puerperium: Principal | ICD-10-CM

## 2018-12-05 LAB — PROTEIN / CREATININE RATIO, URINE
Creatinine, Urine: 57.88 mg/dL
Total Protein, Urine: <6 mg/dL

## 2018-12-05 LAB — CBC
HCT: 40.5 % (ref 36.0–46.0)
Hemoglobin: 13.3 g/dL (ref 12.0–15.0)
MCH: 30.8 pg (ref 26.0–34.0)
MCHC: 32.8 g/dL (ref 30.0–36.0)
MCV: 93.8 fL (ref 80.0–100.0)
Platelets: 273 10*3/uL (ref 150–400)
RBC: 4.32 MIL/uL (ref 3.87–5.11)
RDW: 12.6 % (ref 11.5–15.5)
WBC: 7.5 10*3/uL (ref 4.0–10.5)
nRBC: 0 % (ref 0.0–0.2)

## 2018-12-05 LAB — COMPREHENSIVE METABOLIC PANEL
ALT: 120 U/L — ABNORMAL HIGH (ref 0–44)
AST: 65 U/L — ABNORMAL HIGH (ref 15–41)
Albumin: 3.6 g/dL (ref 3.5–5.0)
Alkaline Phosphatase: 98 U/L (ref 38–126)
Anion gap: 11 (ref 5–15)
BUN: 9 mg/dL (ref 6–20)
CO2: 23 mmol/L (ref 22–32)
Calcium: 7.7 mg/dL — ABNORMAL LOW (ref 8.9–10.3)
Chloride: 103 mmol/L (ref 98–111)
Creatinine, Ser: 0.79 mg/dL (ref 0.44–1.00)
GFR calc Af Amer: >60 mL/min (ref >60)
GFR calc non Af Amer: >60 mL/min (ref >60)
Glucose, Bld: 106 mg/dL — ABNORMAL HIGH (ref 70–99)
Potassium: 3.4 mmol/L — ABNORMAL LOW (ref 3.5–5.1)
Sodium: 137 mmol/L (ref 135–145)
Total Bilirubin: 0.7 mg/dL (ref 0.3–1.2)
Total Protein: 7 g/dL (ref 6.5–8.1)

## 2018-12-05 LAB — TYPE AND SCREEN
ABO/RH(D): O POS
Antibody Screen: NEGATIVE

## 2018-12-05 NOTE — Progress Notes (Signed)
Post Partum Day 19 Subjective: Patient reports feeling well without complaints. She denies HA, visual changes, RUQ/epigastric pain, nausea or emesis  Objective: Blood pressure 126/85, pulse (!) 103, temperature 98.2 F (36.8 C), temperature source Oral, resp. rate 18, last menstrual period 01/19/2018, SpO2 100 %, unknown if currently breastfeeding.  Physical Exam:  GENERAL: Well-developed, well-nourished female in no acute distress.  NECK: Supple. Normal thyroid.  LUNGS: Clear to auscultation bilaterally.  HEART: Regular rate and rhythm. ABDOMEN: Soft, nontender, nondistended.  EXTREMITIES: No cyanosis, clubbing, or edema, 2+ distal pulses.    Recent Results (from the past 2160 hour(s))  Protein / creatinine ratio, urine     Status: None   Collection Time: 10/18/18 11:12 AM  Result Value Ref Range   Creatinine, Urine 186.7 Not Estab. mg/dL   Protein, Ur 01.7 Not Estab. mg/dL   Protein/Creat Ratio 510 0 - 200 mg/g creat  Cervicovaginal ancillary only     Status: None   Collection Time: 10/30/18 12:00 AM  Result Value Ref Range   Chlamydia Negative     Comment: Normal Reference Range - Negative   Neisseria Gonorrhea Negative     Comment: Normal Reference Range - Negative  Culture, beta strep (group b only)     Status: None   Collection Time: 10/30/18 10:39 AM   Specimen: Vaginal/Rectal; Genital   VR  Result Value Ref Range   Strep Gp B Culture Negative Negative    Comment: Centers for Disease Control and Prevention (CDC) and American Congress of Obstetricians and Gynecologists (ACOG) guidelines for prevention of perinatal group B streptococcal (GBS) disease specify co-collection of a vaginal and rectal swab specimen to maximize sensitivity of GBS detection. Per the CDC and ACOG, swabbing both the lower vagina and rectum substantially increases the yield of detection compared with sampling the vagina alone. Penicillin G, ampicillin, or cefazolin are indicated for  intrapartum prophylaxis of perinatal GBS colonization. Reflex susceptibility testing should be performed prior to use of clindamycin only on GBS isolates from penicillin-allergic women who are considered a high risk for anaphylaxis. Treatment with vancomycin without additional testing is warranted if resistance to clindamycin is noted.   SARS Coronavirus 2 [facility] order, Performed in [facility] hospital lab) Nasopharyngeal Nasopharyngeal Swab     Status: None   Collection Time: 11/15/18  3:09 PM   Specimen: Nasopharyngeal Swab  Result Value Ref Range   SARS Coronavirus 2 NEGATIVE NEGATIVE    Comment: (NOTE) If result is NEGATIVE SARS-CoV-2 target nucleic acids are NOT DETECTED. The SARS-CoV-2 RNA is generally detectable in upper and lower  respiratory specimens during the acute phase of infection. The lowest  concentration of SARS-CoV-2 viral copies this assay can detect is 250  copies / mL. A negative result does not preclude SARS-CoV-2 infection  and should not be used as the sole basis for treatment or other  patient management decisions.  A negative result may occur with  improper specimen collection / handling, submission of specimen other  than nasopharyngeal swab, presence of viral mutation(s) within the  areas targeted by this assay, and inadequate number of viral copies  (<250 copies / mL). A negative result must be combined with clinical  observations, patient history, and epidemiological information. If result is POSITIVE SARS-CoV-2 target nucleic acids are DETECTED. The SARS-CoV-2 RNA is generally detectable in upper and lower  respiratory specimens dur ing the acute phase of infection.  Positive  results are indicative of active infection with SARS-CoV-2.  Clinical  correlation with  patient history and other diagnostic information is  necessary to determine patient infection status.  Positive results do  not rule out bacterial infection or co-infection with other  viruses. If result is PRESUMPTIVE POSTIVE SARS-CoV-2 nucleic acids MAY BE PRESENT.   A presumptive positive result was obtained on the submitted specimen  and confirmed on repeat testing.  While 2019 novel coronavirus  (SARS-CoV-2) nucleic acids may be present in the submitted sample  additional confirmatory testing may be necessary for epidemiological  and / or clinical management purposes  to differentiate between  SARS-CoV-2 and other Sarbecovirus currently known to infect humans.  If clinically indicated additional testing with an alternate test  methodology [phone]) is advised. The SARS-CoV-2 RNA is generally  detectable in upper and lower respiratory sp ecimens during the acute  phase of infection. The expected result is Negative. Fact Sheet for Patients:  BoilerBrush.com.cy Fact Sheet for Healthcare Providers: https://pope.com/ This test is not yet approved or cleared by the [place] FDA and has been authorized for detection and/or diagnosis of SARS-CoV-2 by FDA under an Emergency Use Authorization (EUA).  This EUA will remain in effect (meaning this test can be used) for the duration of the COVID-19 declaration under Section 564(b)(1) of the Act, 21 U.S.C. section 360bbb-3(b)(1), unless the authorization is terminated or revoked sooner. Performed at [facility] Lab, [address] [place], [place] [address]   Glucose, capillary     Status: None   Collection Time: 11/15/18  4:17 PM  Result Value Ref Range   Glucose-Capillary 81 70 - 99 mg/dL  Type and screen [facility]     Status: None   Collection Time: 11/15/18  4:21 PM  Result Value Ref Range   ABO/RH(D) O POS    Antibody Screen NEG    Sample Expiration      11/18/2018,2359 Performed at [facility] Lab, [address] [place], [place] [address]   ABO/Rh     Status: None   Collection Time: 11/15/18  4:21 PM  Result Value Ref Range    ABO/RH(D)      O POS Performed at [facility] Lab, [address], [place], [place] [address]   CBC     Status: Abnormal   Collection Time: 11/15/18  4:22 PM  Result Value Ref Range   WBC 9.1 4.0 - 10.5 K/uL   RBC 3.61 (L) 3.87 - 5.11 MIL/uL   Hemoglobin 11.6 (L) 12.0 - 15.0 g/dL   HCT 56.2 (L) 13.0 - 86.5 %   MCV 94.2 80.0 - 100.0 fL   MCH 32.1 26.0 - 34.0 pg   MCHC 34.1 30.0 - 36.0 g/dL   RDW 78.4 69.6 - 29.5 %   Platelets 188 150 - 400 K/uL   nRBC 0.0 0.0 - 0.2 %    Comment: Performed at [facility] Lab, [address], [place], [place] [address]  RPR     Status: None   Collection Time: 11/15/18  4:22 PM  Result Value Ref Range   RPR Ser Ql NON REACTIVE NON REACTIVE    Comment: Performed at [facility] Lab, [address] [place], [place] [address]  Glucose, capillary     Status: None   Collection Time: 11/15/18  8:34 PM  Result Value Ref Range   Glucose-Capillary 93 70 - 99 mg/dL  Glucose, capillary     Status: Abnormal   Collection Time: 11/16/18  1:24 AM  Result Value Ref Range  Glucose-Capillary 101 (H) 70 - 99 mg/dL  CBC     Status: Abnormal   Collection Time: 11/16/18  3:41 AM  Result Value Ref Range   WBC 9.6 4.0 - 10.5 K/uL   RBC 3.59 (L) 3.87 - 5.11 MIL/uL   Hemoglobin 11.7 (L) 12.0 - 15.0 g/dL   HCT 16.1 (L) 09.6 - 04.5 %   MCV 93.6 80.0 - 100.0 fL   MCH 32.6 26.0 - 34.0 pg   MCHC 34.8 30.0 - 36.0 g/dL   RDW 40.9 81.1 - 91.4 %   Platelets 170 150 - 400 K/uL   nRBC 0.0 0.0 - 0.2 %    Comment: Performed at [facility] Lab, [address] [place], [place] [address]  Glucose, capillary     Status: None   Collection Time: 11/16/18  5:39 AM  Result Value Ref Range   Glucose-Capillary 85 70 - 99 mg/dL  Glucose, capillary     Status: None   Collection Time: 11/16/18 10:15 AM  Result Value Ref Range   Glucose-Capillary 89 70 - 99 mg/dL  Glucose, capillary     Status: None   Collection Time: 11/16/18  4:02 PM  Result Value Ref Range    Glucose-Capillary 84 70 - 99 mg/dL  Glucose, capillary     Status: None   Collection Time: 11/16/18  5:19 PM  Result Value Ref Range   Glucose-Capillary 93 70 - 99 mg/dL  CBC     Status: Abnormal   Collection Time: 11/16/18  6:13 PM  Result Value Ref Range   WBC 12.5 (H) 4.0 - 10.5 K/uL   RBC 3.36 (L) 3.87 - 5.11 MIL/uL   Hemoglobin 10.8 (L) 12.0 - 15.0 g/dL   HCT 62.1 (L) 30.8 - 65.7 %   MCV 97.0 80.0 - 100.0 fL   MCH 32.1 26.0 - 34.0 pg   MCHC 33.1 30.0 - 36.0 g/dL   RDW 84.6 96.2 - 95.2 %   Platelets 169 150 - 400 K/uL   nRBC 0.0 0.0 - 0.2 %    Comment: Performed at [facility] Lab, [address], [place], [place] [address]  Glucose, capillary     Status: None   Collection Time: 11/17/18  6:08 AM  Result Value Ref Range   Glucose-Capillary 93 70 - 99 mg/dL  Lipase, blood     Status: None   Collection Time: 12/04/18  8:30 AM  Result Value Ref Range   Lipase 38 11 - 51 U/L    Comment: Performed at [facility] Lab, [address], [place], [place] [address]  Comprehensive metabolic panel     Status: Abnormal   Collection Time: 12/04/18  8:30 AM  Result Value Ref Range   Sodium 137 135 - 145 mmol/L   Potassium 4.3 3.5 - 5.1 mmol/L   Chloride 105 98 - 111 mmol/L   CO2 21 (L) 22 - 32 mmol/L   Glucose, Bld 118 (H) 70 - 99 mg/dL   BUN 7 6 - 20 mg/dL   Creatinine, Ser 2.72 0.44 - 1.00 mg/dL   Calcium 9.5 8.9 - 53.6 mg/dL   Total Protein 7.8 6.5 - 8.1 g/dL   Albumin 3.9 3.5 - 5.0 g/dL   AST 644 (H) 15 - 41 U/L   ALT 192 (H) 0 - 44 U/L   Alkaline Phosphatase 106 38 - 126 U/L   Total Bilirubin 1.3 (H) 0.3 - 1.2 mg/dL   GFR calc non Af Amer >60 >  60 mL/min   GFR calc Af Amer >60 >60 mL/min   Anion gap 11 5 - 15    Comment: Performed at [facility] Lab, [address], [place], [place] [address]  CBC     Status: None   Collection Time: 12/04/18  8:30 AM  Result Value Ref Range   WBC 7.2 4.0 - 10.5 K/uL   RBC 4.43 3.87 - 5.11 MIL/uL   Hemoglobin 13.9 12.0 - 15.0  g/dL   HCT 60.4 54.0 - 98.1 %   MCV 93.5 80.0 - 100.0 fL   MCH 31.4 26.0 - 34.0 pg   MCHC 33.6 30.0 - 36.0 g/dL   RDW 19.1 47.8 - 29.5 %   Platelets 300 150 - 400 K/uL   nRBC 0.0 0.0 - 0.2 %    Comment: Performed at [facility] Lab, [address], [place], [place] [address]  Urinalysis, Routine w reflex microscopic     Status: Abnormal   Collection Time: 12/04/18  5:30 PM  Result Value Ref Range   Color, Urine YELLOW YELLOW   APPearance CLEAR CLEAR   Specific Gravity, Urine 1.015 1.005 - 1.030   pH 6.5 5.0 - 8.0   Glucose, UA NEGATIVE NEGATIVE mg/dL   Hgb urine dipstick TRACE (A) NEGATIVE   Bilirubin Urine NEGATIVE NEGATIVE   Ketones, ur 40 (A) NEGATIVE mg/dL   Protein, ur NEGATIVE NEGATIVE mg/dL   Nitrite NEGATIVE NEGATIVE   Leukocytes,Ua NEGATIVE NEGATIVE    Comment: Performed at [facility] Lab, [address] [place], [place] [address]  Urinalysis, Microscopic (reflex)     Status: None   Collection Time: 12/04/18  5:30 PM  Result Value Ref Range   RBC / HPF 0-5 0 - 5 RBC/hpf   WBC, UA 0-5 0 - 5 WBC/hpf   Bacteria, UA NONE SEEN NONE SEEN   Squamous Epithelial / LPF 0-5 0 - 5   Mucus PRESENT     Comment: Performed at [facility] Lab, [address], [place], [place] [address]  Hepatitis panel, acute     Status: None   Collection Time: 12/04/18  5:58 PM  Result Value Ref Range   Hepatitis B Surface Ag NON REACTIVE NON REACTIVE   HCV Ab NON REACTIVE NON REACTIVE    Comment: (NOTE) Nonreactive HCV antibody screen is consistent with no HCV infections,  unless recent infection is suspected or other evidence exists to indicate HCV infection.    Hep A IgM NON REACTIVE NON REACTIVE   Hep B C IgM NON REACTIVE NON REACTIVE    Comment: Performed at [facility] Lab, [address], [place], [place] [address]  SARS Coronavirus 2 by RT PCR (hospital order, performed in [facility] hospital lab) Nasopharyngeal Nasopharyngeal Swab     Status: None   Collection Time:  12/04/18  6:47 PM   Specimen: Nasopharyngeal Swab  Result Value Ref Range   SARS Coronavirus 2 NEGATIVE NEGATIVE    Comment: (NOTE) If result is NEGATIVE SARS-CoV-2 target nucleic acids are NOT DETECTED. The SARS-CoV-2 RNA is generally detectable in upper and lower  respiratory specimens during the acute phase of infection. The lowest  concentration of SARS-CoV-2 viral copies this assay can detect is 250  copies / mL. A negative result does not preclude SARS-CoV-2 infection  and should not be used as the sole basis for treatment or other  patient management decisions.  A negative result may occur with  improper specimen collection /  handling, submission of specimen other  than nasopharyngeal swab, presence of viral mutation(s) within the  areas targeted by this assay, and inadequate number of viral copies  (<250 copies / mL). A negative result must be combined with clinical  observations, patient history, and epidemiological information. If result is POSITIVE SARS-CoV-2 target nucleic acids are DETECTED. The SARS-CoV-2 RNA is generally detectable in upper and lower  respiratory specimens dur ing the acute phase of infection.  Positive  results are indicative of active infection with SARS-CoV-2.  Clinical  correlation with patient history and other diagnostic information is  necessary to determine patient infection status.  Positive results do  not rule out bacterial infection or co-infection with other viruses. If result is PRESUMPTIVE POSTIVE SARS-CoV-2 nucleic acids MAY BE PRESENT.   A presumptive positive result was obtained on the submitted specimen  and confirmed on repeat testing.  While 2019 novel coronavirus  (SARS-CoV-2) nucleic acids may be present in the submitted sample  additional confirmatory testing may be necessary for epidemiological  and / or clinical management purposes  to differentiate between  SARS-CoV-2 and other Sarbecovirus currently known to infect humans.   If clinically indicated additional testing with an alternate test  methodology [phone]) is advised. The SARS-CoV-2 RNA is generally  detectable in upper and lower respiratory sp ecimens during the acute  phase of infection. The expected result is Negative. Fact Sheet for Patients:  BoilerBrush.com.cyhttps://www.fda.gov/media/136312/download Fact Sheet for Healthcare Providers: https://pope.com/https://www.fda.gov/media/136313/download This test is not yet approved or cleared by the [place] FDA and has been authorized for detection and/or diagnosis of SARS-CoV-2 by FDA under an Emergency Use Authorization (EUA).  This EUA will remain in effect (meaning this test can be used) for the duration of the COVID-19 declaration under Section 564(b)(1) of the Act, 21 U.S.C. section 360bbb-3(b)(1), unless the authorization is terminated or revoked sooner. Performed at [facility], [address] [place], [place] [address]   Fibrinogen     Status: None   Collection Time: 12/04/18  6:50 PM  Result Value Ref Range   Fibrinogen 295 210 - 475 mg/dL    Comment: Performed at [facility], [address] [place], [place] [address]  Protime-INR     Status: None   Collection Time: 12/04/18  6:50 PM  Result Value Ref Range   Prothrombin Time 13.9 11.4 - 15.2 seconds   INR 1.1 0.8 - 1.2    Comment: (NOTE) INR goal varies based on device and disease states. Performed at [facility], [address] [place], [place] [address]   APTT     Status: None   Collection Time: 12/04/18  6:50 PM  Result Value Ref Range   aPTT 36 24 - 36 seconds    Comment: Performed at [facility], [address] [place], [place] [address]  CBC     Status: None   Collection Time: 12/04/18  6:50 PM  Result Value Ref Range   WBC 7.9 4.0 - 10.5 K/uL   RBC 4.16 3.87 - 5.11 MIL/uL   Hemoglobin 13.1 12.0 - 15.0 g/dL   HCT 08.639.3 57.836.0 - 46.946.0 %   MCV 94.5 80.0 - 100.0 fL   MCH 31.5 26.0 - 34.0 pg   MCHC 33.3 30.0 - 36.0 g/dL    RDW 62.912.5 52.811.5 - 41.315.5 %   Platelets 273 150 - 400 K/uL   nRBC 0.0 0.0 - 0.2 %    Comment: Performed at [facility], [address] [place], [place] [address]  Comprehensive metabolic panel     Status: Abnormal   Collection Time: 12/04/18  6:50 PM  Result Value Ref Range   Sodium 141 135 - 145 mmol/L   Potassium 3.8 3.5 - 5.1 mmol/L   Chloride 108 98 - 111 mmol/L   CO2 22 22 - 32 mmol/L   Glucose, Bld 80 70 - 99 mg/dL   BUN 9 6 - 20 mg/dL   Creatinine, Ser 6.04 0.44 - 1.00 mg/dL   Calcium 9.1 8.9 - 54.0 mg/dL   Total Protein 6.8 6.5 - 8.1 g/dL   Albumin 3.6 3.5 - 5.0 g/dL   AST 981 (H) 15 - 41 U/L   ALT 141 (H) 0 - 44 U/L   Alkaline Phosphatase 94 38 - 126 U/L   Total Bilirubin 0.7 0.3 - 1.2 mg/dL   GFR calc non Af Amer >60 >60 mL/min   GFR calc Af Amer >60 >60 mL/min   Anion gap 11 5 - 15    Comment: Performed at [facility] Lab, [address] [place], [place] [address]  Protein / creatinine ratio, urine     Status: None   Collection Time: 12/04/18 11:45 PM  Result Value Ref Range   Creatinine, Urine 57.88 mg/dL   Total Protein, Urine <6 mg/dL   Protein Creatinine Ratio        0.00 - 0.15 mg/mg[Cre]    Comment: RESULT BELOW REPORTABLE RANGE, UNABLE TO CALCULATE. Performed at [facility] Lab, [address], [place], [place] [address]   CBC     Status: None   Collection Time: 12/05/18  5:17 AM  Result Value Ref Range   WBC 7.5 4.0 - 10.5 K/uL   RBC 4.32 3.87 - 5.11 MIL/uL   Hemoglobin 13.3 12.0 - 15.0 g/dL   HCT 21.3 08.6 - 57.8 %   MCV 93.8 80.0 - 100.0 fL   MCH 30.8 26.0 - 34.0 pg   MCHC 32.8 30.0 - 36.0 g/dL   RDW 46.9 62.9 - 52.8 %   Platelets 273 150 - 400 K/uL   nRBC 0.0 0.0 - 0.2 %    Comment: Performed at [facility] Lab, [address] [place], [place] [address]  Comprehensive metabolic panel     Status: Abnormal   Collection Time: 12/05/18  5:17 AM  Result Value Ref Range   Sodium 137 135 - 145 mmol/L   Potassium 3.4 (L) 3.5 - 5.1  mmol/L   Chloride 103 98 - 111 mmol/L   CO2 23 22 - 32 mmol/L   Glucose, Bld 106 (H) 70 - 99 mg/dL   BUN 9 6 - 20 mg/dL   Creatinine, Ser 4.01 0.44 - 1.00 mg/dL   Calcium 7.7 (L) 8.9 - 10.3 mg/dL   Total Protein 7.0 6.5 - 8.1 g/dL   Albumin 3.6 3.5 - 5.0 g/dL   AST 65 (H) 15 - 41 U/L   ALT 120 (H) 0 - 44 U/L   Alkaline Phosphatase 98 38 - 126 U/L   Total Bilirubin 0.7 0.3 - 1.2 mg/dL   GFR calc non Af Amer >60 >60 mL/min   GFR calc Af Amer >60 >60 mL/min   Anion gap 11 5 - 15    Comment: Performed at [facility] Lab, [address], [place], [place] [address]  Type and screen [facility]     Status: None   Collection Time:  12/05/18  5:17 AM  Result Value Ref Range   ABO/RH(D) O POS    Antibody Screen NEG    Sample Expiration      12/08/2018,2359 Performed at [facility] Lab, [place] [address] [place],  [address]     Assessment/Plan: 33 yo PPD# 29 admitted with pp preeclampsia/? Atypical HELLP - Continue magnesium sulfate for 24 hours - BP stable on procardia and labetalol - Labs normalizing - Continue close monitoring   LOS: 1 day   Caspian Deleonardis 12/05/2018, 10:53 AM

## 2018-12-05 NOTE — Lactation Note (Signed)
Lactation Consultation Note  Patient Name: Casey Mcmahon Date: 12/05/2018   This patient is 19 days postpartum.  She has been using the DEBP set-up by the nursing staff & is content with using the size 24 flanges. She typically feeds her infant at the breast, but also has a Pump-in-style at home.  She has no breast complaints (she had been engorged prior to being set up with the pump, but her breasts have felt fine since). The patient inquired about how often to pump. I suggested that she attempt to pump at the same times as her infant would have fed.   The patient was shown how to assemble & use hand pump (single- & double-mode) that was included in pump kit. I provided a patient with dishwashing liquid & bins to wash her pump parts.The lactation student washed her pump parts and set them to dry.    Matthias Hughs [facility] 12/05/2018, 11:21 AM

## 2018-12-06 MED ORDER — IBUPROFEN 600 MG PO TABS: 600.0000 mg | ORAL_TABLET | Freq: Four times a day (QID) | ORAL | 0 refills | Status: AC | PRN

## 2018-12-06 MED ORDER — NIFEDIPINE ER 30 MG PO TB24: 30.0000 mg | ORAL_TABLET | Freq: Every day | ORAL | 1 refills | Status: DC

## 2018-12-06 NOTE — Lactation Note (Signed)
Lactation Consultation Note  Patient Name: Casey Mcmahon HRCBU'L Date: 12/06/2018  Mom will be discharged today.  She states she is pumping less milk.  Discussed supply and demand.  Recommended breastfeeding and pumping more often to stimulate milk supply.  Mom is also drinking mothers milk tea and eating lactation cookies.  Reviewed outpatient services and encouraged to call prn.   Maternal Data    Feeding    LATCH Score                   Interventions    Lactation Tools Discussed/Used     Consult Status      Ave Filter 12/06/2018, 10:24 AM

## 2018-12-06 NOTE — Discharge Summary (Signed)
Postpartum Discharge Summary      Patient Name: Casey Mcmahon DOB: Mar 16, 1985 MRN: [id number]  Date of admission: 12/04/2018 Delivering Provider: Gifford Shave   Date of discharge: 12/06/2018  Admitting diagnosis: Biliary sludge [K83.8] RUQ abdominal pain [R10.11] Elevated LFTs [R79.89] Preeclampsia in postpartum period [O14.95] Intrauterine pregnancy: 101w3d     Secondary diagnosis:  Active Problems:   Hemolysis, elevated liver enzymes, and low platelet (HELLP) syndrome during pregnancy, delivered, with postpartum complication      Discharge diagnosis: postpartum atypical HELLP                                   Hospital course:  Patient admitted with headache and abnormal labs concerning for atypical HELLP syndrome. She received magnesium sulfate for 24 hours. Her symptoms resolved completely and her labs improved. Patient discharged home on procardia 30 mg daily. Discharge instructions were reviewed. Patient to follow up as scheduled on 11/18 for postpartum visit    Physical exam  Vitals:   12/05/18 1930 12/05/18 2329 12/06/18 0300 12/06/18 0750  BP: 128/84 138/86 129/77 138/90  Pulse: 92 87 89 74  Resp: 18 18 18 18   Temp: (!) 97.4 F (36.3 C) 98.4 F (36.9 C) 98.4 F (36.9 C) 98.4 F (36.9 C)  TempSrc: Axillary Oral Oral Oral  SpO2: 98% 96% 98% 99%  Weight:      Height:       GENERAL: Well-developed, well-nourished female in no acute distress.  LUNGS: Clear to auscultation bilaterally.  HEART: Regular rate and rhythm. ABDOMEN: Soft, nontender, nondistended. No organomegaly. EXTREMITIES: No cyanosis, clubbing, or edema, 2+ distal pulses.  Labs: Lab Results  Component Value Date   WBC 7.5 12/05/2018   HGB 13.3 12/05/2018   HCT 40.5 12/05/2018   MCV 93.8 12/05/2018   PLT 273 12/05/2018   CMP Latest Ref Rng & Units 12/05/2018  Glucose 70 - 99 mg/dL 106(H)  BUN 6 - 20 mg/dL 9  Creatinine 0.44 - 1.00 mg/dL 0.79  Sodium 135 - 145 mmol/L 137  Potassium  3.5 - 5.1 mmol/L 3.4(L)  Chloride 98 - 111 mmol/L 103  CO2 22 - 32 mmol/L 23  Calcium 8.9 - 10.3 mg/dL 7.7(L)  Total Protein 6.5 - 8.1 g/dL 7.0  Total Bilirubin 0.3 - 1.2 mg/dL 0.7  Alkaline Phos 38 - 126 U/L 98  AST 15 - 41 U/L 65(H)  ALT 0 - 44 U/L 120(H)     After visit meds:  Allergies as of 12/06/2018   No Known Allergies     Medication List    STOP taking these medications   aspirin EC 81 MG tablet   hydrochlorothiazide 25 MG tablet Commonly known as: HYDRODIURIL   lisinopril 20 MG tablet Commonly known as: ZESTRIL     TAKE these medications   acetaminophen 500 MG tablet Commonly known as: TYLENOL Take 1,000 mg by mouth every 6 (six) hours as needed for mild pain.   diphenhydrAMINE 25 MG tablet Commonly known as: BENADRYL Take 25 mg by mouth every 6 (six) hours as needed for itching or sleep.   fluticasone 50 MCG/ACT nasal spray Commonly known as: FLONASE Place 1 spray into both nostrils daily.   ibuprofen 600 MG tablet Commonly known as: ADVIL Take 1 tablet (600 mg total) by mouth every 6 (six) hours as needed for mild pain or cramping.   NIFEdipine 30 MG 24 hr tablet Commonly known  as: ADALAT CC Take 1 tablet (30 mg total) by mouth daily.   oxyCODONE 5 MG immediate release tablet Commonly known as: Roxicodone Take 1 tablet (5 mg total) by mouth every 8 (eight) hours as needed. What changed: reasons to take this   Prenatal 27-1 MG Tabs Take 1 tablet by mouth daily.   promethazine 12.5 MG tablet Commonly known as: PHENERGAN Take 1 tablet (12.5 mg total) by mouth every 6 (six) hours as needed for nausea or vomiting.       Diet: low salt diet  Activity: Advance as tolerated. Pelvic rest for 6 weeks.   Outpatient follow up:2 weeks Follow up Appt: Future Appointments  Date Time Provider Department Center  12/27/2018  8:20 AM [facility]  12/27/2018  8:55 AM Magnus Sinning Dimas [place], PA-C [facility]   Follow up Visit: Follow-up  Information    Alysia Penna, MD.   Specialty: Internal Medicine Contact information: [address] [place] [address] [phone]        [facility] EMERGENCY DEPARTMENT.   Specialty: Emergency Medicine Why: Return to ED with new or worsening symptoms or concerns Contact information: [address] [id number] [place] [address] [phone]             12/06/2018 Catalina Antigua, MD

## 2018-12-06 NOTE — Progress Notes (Signed)
Pt ambulated out with family  Teaching complete  

## 2018-12-08 ENCOUNTER — Telehealth: Payer: Self-pay | Admitting: *Deleted

## 2018-12-08 DIAGNOSIS — I10 Essential (primary) hypertension: Secondary | ICD-10-CM

## 2018-12-08 MED ORDER — LISINOPRIL 20 MG PO TABS: 20.0000 mg | ORAL_TABLET | Freq: Every day | ORAL | 0 refills | Status: DC

## 2018-12-08 NOTE — Telephone Encounter (Signed)
Per chart hx CHTN, AMA, GDM. Readmitted for postpartum atypical HELLP and d/c 12/06/18. I called Casey Mcmahon and explained I was calling back to follow up on her MyChart message.  She reports yesterday she had a slight headache so she took  Her blood pressure and ibuprofen. States it was above 140/90, she rested and her headache was relieved. She denies any headache, edema, epigastric pain or visual changes today. Casey Mcmahon is taking procardia as ordered. She is breastfeeding. I explained I will talk to one of our doctors and get back to her.  Jacques Navy  I discussed with Dr. Roselie Awkward - we reviewed her history and her complaints, blood pressure and that she is breastfeeding. Orders given to start Lisinopril 20 mg daily; continue Procardia. Keep checking blood pressure at home-notify us if not improving or worsening. Go to hospital if needed. Also instructed to keep pp appintment as scheduled. She voices understanding.  Linda,RN

## 2018-12-14 ENCOUNTER — Ambulatory Visit: Payer: Self-pay

## 2018-12-14 NOTE — Lactation Note (Signed)
This note was copied from a baby's chart. Lactation Consultation Note  Patient Name: Casey Mcmahon [id number] Date: 12/14/2018     12/14/2018  Name: Casey Mcmahon MRN: [id number] Date of Birth: 11/16/2020 Gestational Age: Gestational Age: [redacted]w[redacted]d Birth Weight: 121.2 oz Weight today:  Weight: 9 lb 2.7 oz (6472 g)  85 week old ET infant presents with mom for feeding assessment. Ped has been concerned with slower weight gain. Mom recently in the hospital due to BP issues.   Infant has gained 860 grams in the last 26 days with an average daily weight gain of 33 grams a day.   Infant feeds about every 2-2.5 hours. Mom has to awaken infant during the day some, infant awakens on his own at night every 4-5 hours once at night.   Infant is voiding well, stools are every other day. Amount can be large and is soft.   Infant is being supplemented with formula occasionally. Infant using a Mam bottle and [place] noted to have some choking and drooling on the bottle. Mom is pace bottle feeding.    Mom is pumping about once a day and getting about 3 ounces per pumping.   Infant with thick labial frenulum that inserts at the bottom of the gum ridge. Upper lip needs flanging on the breast. Infant with jaw quivering in the office today. Infant with sucking blister to upper center lip. Infant with [place] lips. Infant with short posterior lingual frenulum with decreased mid tongue elevation. Infant with good tongue extension and lateralization. Infant with strong suckle on gloved finger. Infant with clicking on the breast, infant sleepy at the breast at times. Nipple is slightly compressed post feeding. Mom with no pain with feeding. Mom feels infant is pretty gassy, infant is difficult to burp at times. Infant on Probiotic and Vitamin D. He is getting gripe water occasionally. Mom was shown tongue and lip restrictions and how they can effect milk supply and milk transfer over time and that it sometimes does not  show up until 6-12 weeks. Local Provider and Website information given.   Infant seems to tire easily at the breast and eats small frequent meals.   Mom given information on Fenugreek and enc to pump anytime infant gets a bottle.   Infant to follow up with Dr. Abran Cantor tomorrow. Infant to follow up with Lactation as needed or 1-5 days post tongue and lip releases if completed.     General Information: Mother's reason for visit: Feeding assessment, slow weight gain Consult: Initial Lactation consultant: Jasmine December Albertine Lafoy RN,IBCLC Breastfeeding experience: BF every 1-5 hours Maternal medical conditions: Gestational diabetes mellitus, Pregnancy induced hypertension, Polycystic ovarian syndrome Maternal medications: Pre-natal vitamin, Motrin (ibuprofen)(Nifedipine, Lacinopril, Lactation Cookies, Mother's Milk Tea)  Breastfeeding History: Frequency of breast feeding: every 1-5 hours Duration of feeding: 15-20 minutes  Supplementation: Supplement method: bottle(Mam, choking and drooling) Brand: Enfamil Formula volume: 3 ounces Formula frequency: once a day Total formula volume per day: 3 ounces Breast milk volume: 3 ounces Breast milk frequency: 1 x a day Total breast milk volume per day: 3 ounces Pump type: Medela pump in style Pump frequency: 1 x a day Pump volume: 3 ounces  Infant Output Assessment: Voids per 24 hours: 8+ Urine color: Clear yellow Stools per 24 hours: every other day, usually large Stool color: Yellow  Breast Assessment: Breast: Soft, Compressible Nipple: Erect Pain level: 0 Pain interventions: Bra, Breast pump  Feeding Assessment: Infant oral assessment: Variance Infant oral assessment comment: see note  Positioning: Cross cradle(left breast, 15 minutes) Latch: 2 - Grasps breast easily, tongue down, lips flanged, rhythmical sucking. Audible swallowing: 2 - Spontaneous and intermittent Type of nipple: 2 - Everted at rest and after stimulation Comfort: 2 -  Soft/non-tender Hold: 2 - No assistance needed to correctly position infant at breast LATCH score: 10 Latch assessment: Deep Lips flanged: No(upper lip needs flanging) Suck assessment: Displays both   Pre-feed weight: 4160 grams Post feed weight: 4214 grams Amount transferred: 54 ml Amount supplemented: 0  Additional Feeding Assessment:                                    Totals: Total amount transferred: 54 ml Total supplement given: 0 Total amount pumped post feed: did not pump   Plan: 1. Offer infant the breast with feeding cues 2. Keep infant awake at the breast as needed 3. Feed infant skin to skin 4. Offer infant both breasts with each feeding if he would like it 5. Empty one breast before offering second breast 6. Massage/compress breast with feedings as needed to keep infant active at the breast 7. When offering the bottle, offer using the paced bottle feeding method (video on [email or web address]) 8. Infant needs about 77-103 ml (2.5-3.5 ounces) for 8 feedings a day or 615-820 ml (21-27 ounces) in 24 hours. Infant may take more or less depending on how often he feeds. Feed infant until he is satisfied.  9. If continues to drool or choke on the Mam bottles, try your Tommie [place] bottles with the size 0 nipples 10. Continue offering a bottle of pumped milk or formula as needed after breast feeding 11. Would recommend that you pump 2-3 x a day for 15-20 minutes to promote and protect milk supply. 12. Keep up the good work 77. Thank you for allowing me to assist you today 14. Please call with any questions or concerns as needed [phone] 15. Follow up with  Lactation as needed or 1-5 days post tongue and lip releases if completed.   [place] RN, IBCLC                                                      Debby Freiberg Alisha Bacus 12/14/2018, 9:21 AM

## 2018-12-25 ENCOUNTER — Telehealth: Payer: Self-pay | Admitting: Medical

## 2018-12-25 NOTE — Telephone Encounter (Signed)
Spoke to patient about her appointment on 11/17 @ 8:20. Patient instructed to wear a face mask for the entire appointment and no visitors are allowed with her during the visit. Patient screened for covid symptoms and denied having any. Patient instructed to come fasting.

## 2018-12-26 ENCOUNTER — Other Ambulatory Visit: Payer: Self-pay | Admitting: *Deleted

## 2018-12-26 DIAGNOSIS — O24429 Gestational diabetes mellitus in childbirth, unspecified control: Secondary | ICD-10-CM

## 2018-12-27 ENCOUNTER — Other Ambulatory Visit: Payer: No Typology Code available for payment source

## 2018-12-27 ENCOUNTER — Encounter: Payer: Self-pay | Admitting: Student

## 2018-12-27 ENCOUNTER — Ambulatory Visit (INDEPENDENT_AMBULATORY_CARE_PROVIDER_SITE_OTHER): Payer: No Typology Code available for payment source | Admitting: Student

## 2018-12-27 ENCOUNTER — Other Ambulatory Visit: Payer: Self-pay

## 2018-12-27 DIAGNOSIS — Z1389 Encounter for screening for other disorder: Secondary | ICD-10-CM

## 2018-12-27 DIAGNOSIS — O24429 Gestational diabetes mellitus in childbirth, unspecified control: Secondary | ICD-10-CM

## 2018-12-27 DIAGNOSIS — O0990 Supervision of high risk pregnancy, unspecified, unspecified trimester: Secondary | ICD-10-CM

## 2018-12-27 NOTE — Progress Notes (Signed)
Subjective:     Casey Mcmahon is a 33 y.o. female who presents for a postpartum visit. She is 6 weeks postpartum following a spontaneous vaginal delivery. I have fully reviewed the prenatal and intrapartum course. The delivery was at 38/3 gestational weeks. Outcome: spontaneous vaginal delivery. Anesthesia: epidural. Postpartum course has been complicated by re-admission for post-partum HELLP syndrome. She was in-patient for 48 hours and was given MgSo4. She is currently taking lisinopril and nifedipine. She has been checking her BP at home; usually around 130s/90s. She has not taken her blood pressure today. Baby's course has been . Baby is feeding by both breast and bottle - Carnation Good Start. Bleeding no bleeding. Bowel function is normal. Bladder function is normal. Patient is sexually active. Contraception method is tubal ligation. Postpartum depression screening: negative.  The following portions of the patient's history were reviewed and updated as appropriate: allergies, current medications, past family history, past medical history, past social history, past surgical history and problem list.  Review of Systems Pertinent items are noted in HPI.   Objective:    BP (!) 142/102 (BP Location: Right Arm)   Pulse (!) 101   Ht 5\' 8"  (1.727 m)   Wt 206 lb 9.6 oz (93.7 kg)   Breastfeeding Yes   BMI 31.41 kg/m   General:  alert and cooperative   Breasts:  inspection negative, no nipple discharge or bleeding, no masses or nodularity palpable  Lungs: clear to auscultation bilaterally  Heart:  regular rate and rhythm, S1, S2 normal, no murmur, click, rub or gallop  Abdomen: soft, non-tender; bowel sounds normal; no masses,  no organomegaly   Vulva:  not evaluated  Vagina: not evaluated  Cervix:  not evaluated  Corpus: not examined  Adnexa:  not evaluated  Rectal Exam: Not performed.        Assessment:    Healthy postpartum exam. Pap smear not done at today's visit.   Plan:    1.  Contraception: tubal ligation 2.  Continue taking blood pressure meds as prescribed; will follow-up with PCP. Most likely elevated due to skipping meds this morning.  2 hour GTT in process. We will call her if her test results come back positive for DM. 3. Follow up in: 1 years for pap smear or as needed.

## 2018-12-28 ENCOUNTER — Encounter: Payer: Self-pay | Admitting: Medical

## 2018-12-28 DIAGNOSIS — Z8632 Personal history of gestational diabetes: Secondary | ICD-10-CM | POA: Insufficient documentation

## 2018-12-28 LAB — GLUCOSE TOLERANCE, 2 HOURS
Glucose, 2 hour: 112 mg/dL (ref 65–139)
Glucose, GTT - Fasting: 94 mg/dL (ref 65–99)

## 2019-01-22 ENCOUNTER — Encounter: Payer: Self-pay | Admitting: General Practice

## 2019-01-22 DIAGNOSIS — I10 Essential (primary) hypertension: Secondary | ICD-10-CM

## 2019-01-22 MED ORDER — LISINOPRIL 20 MG PO TABS: 20.0000 mg | ORAL_TABLET | Freq: Every day | ORAL | 2 refills | Status: AC

## 2019-01-23 ENCOUNTER — Encounter: Payer: Self-pay | Admitting: General Practice

## 2019-01-23 MED ORDER — NIFEDIPINE ER 30 MG PO TB24: 30.0000 mg | ORAL_TABLET | Freq: Every day | ORAL | 1 refills | Status: AC

## 2019-01-29 ENCOUNTER — Other Ambulatory Visit: Payer: Self-pay | Admitting: Obstetrics and Gynecology

## 2019-02-15 ENCOUNTER — Ambulatory Visit: Payer: No Typology Code available for payment source | Attending: Internal Medicine

## 2019-02-15 DIAGNOSIS — Z20822 Contact with and (suspected) exposure to covid-19: Secondary | ICD-10-CM

## 2019-02-18 LAB — NOVEL CORONAVIRUS, NAA: SARS-CoV-2, NAA: NOT DETECTED

## 2019-04-10 ENCOUNTER — Telehealth: Payer: Self-pay | Admitting: Lactation Services

## 2019-04-10 NOTE — Telephone Encounter (Signed)
Have received a request for refill of Nifedipine. Patient is no longer pregnant and has been advised to start care with PCP for follow up for BP and medications. Attempted to call patient and she did not answer, was not able to leave a voicemail. Will send My Chart Message.

## 2019-06-04 ENCOUNTER — Other Ambulatory Visit: Payer: Self-pay | Admitting: Family Medicine

## 2019-06-04 DIAGNOSIS — I10 Essential (primary) hypertension: Secondary | ICD-10-CM

## 2019-06-13 ENCOUNTER — Other Ambulatory Visit: Payer: Self-pay | Admitting: Internal Medicine

## 2019-06-13 DIAGNOSIS — R1011 Right upper quadrant pain: Secondary | ICD-10-CM

## 2019-06-15 ENCOUNTER — Ambulatory Visit
Admission: RE | Admit: 2019-06-15 | Discharge: 2019-06-15 | Disposition: A | Discharge status: Home / Self Care | Payer: No Typology Code available for payment source | Source: Ambulatory Visit | Attending: Internal Medicine | Admitting: Internal Medicine

## 2019-06-15 DIAGNOSIS — R1011 Right upper quadrant pain: Secondary | ICD-10-CM

## 2019-07-17 ENCOUNTER — Ambulatory Visit: Payer: Self-pay | Admitting: Surgery

## 2019-08-09 HISTORY — PX: CHOLECYSTECTOMY: SHX55

## 2019-08-20 ENCOUNTER — Other Ambulatory Visit: Payer: Self-pay | Admitting: Surgery

## 2019-09-04 IMAGING — US US MFM OB DETAIL +14 WK
1 series · 13 of 28 positions shown · non-contrast
Comparison: none

[Series 1: us mfm ob detail +14 wk · 13 of 110 slices shown]
[im 5/110]
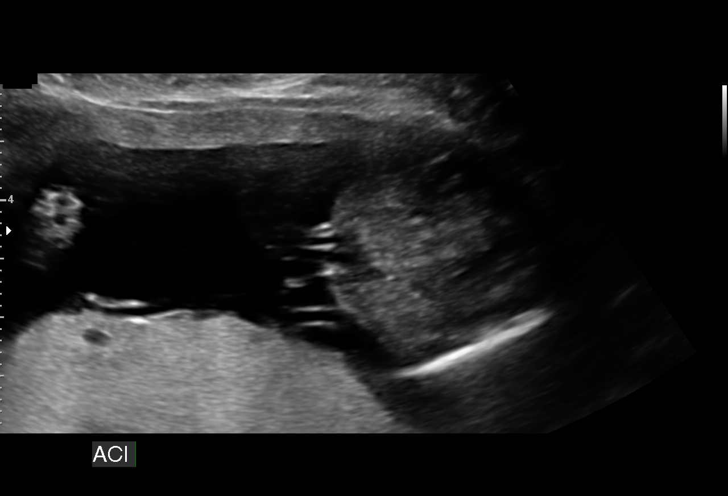
[im 13/110]
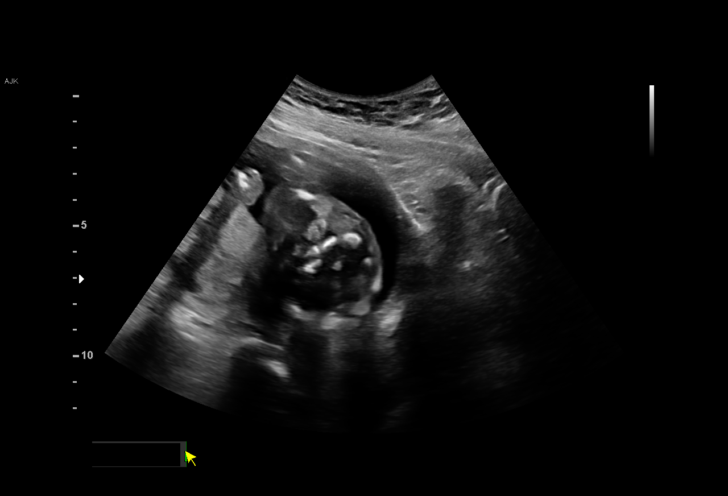
[im 21/110]
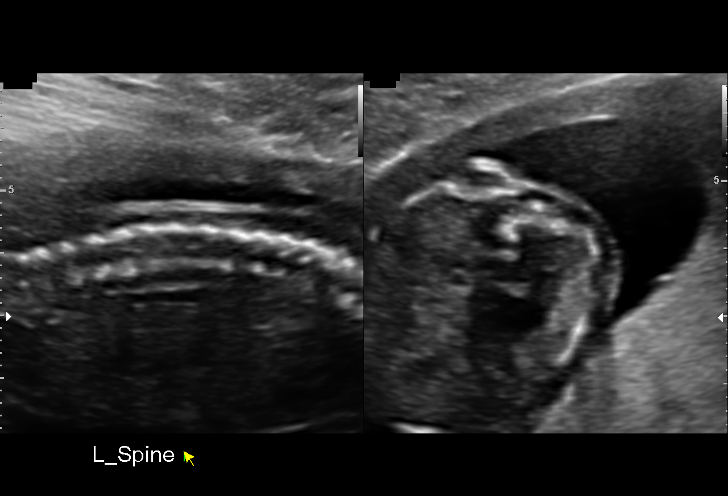
[im 29/110]
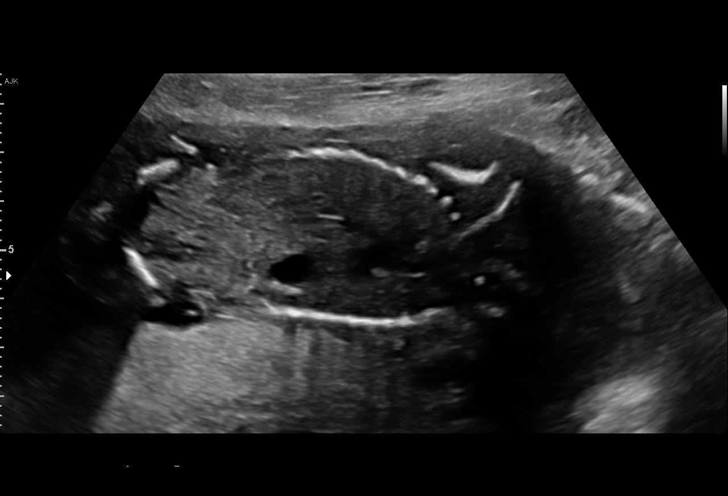
[im 37/110]
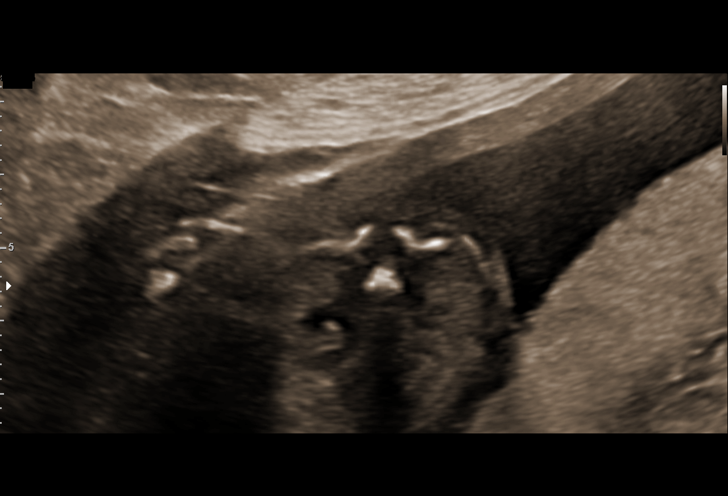
[im 45/110]
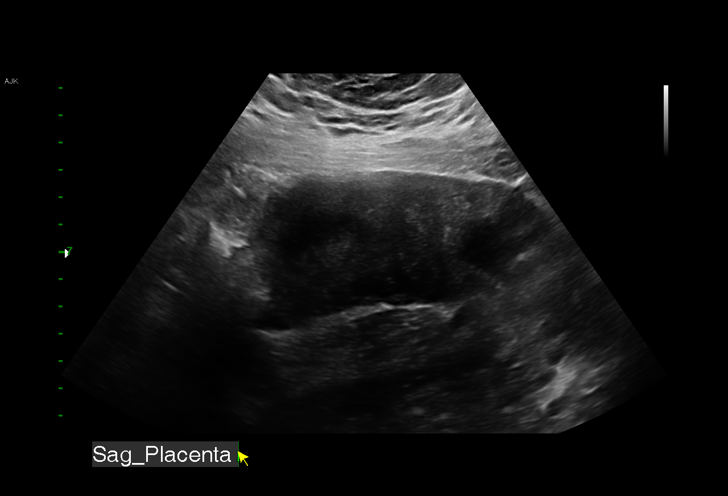
[im 57/110]
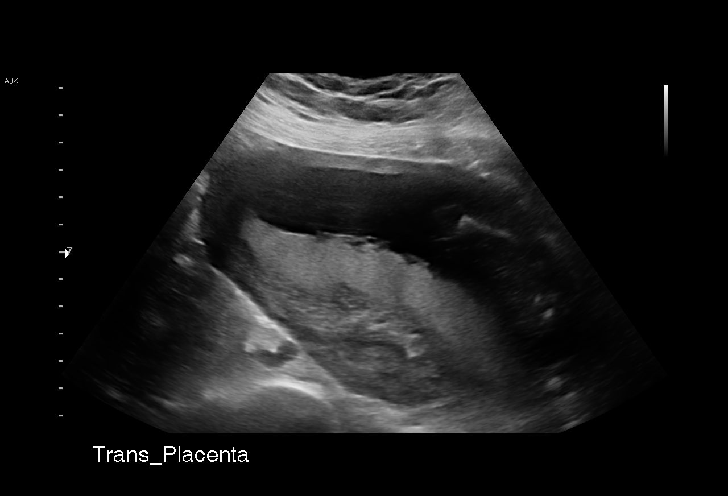
[im 65/110]
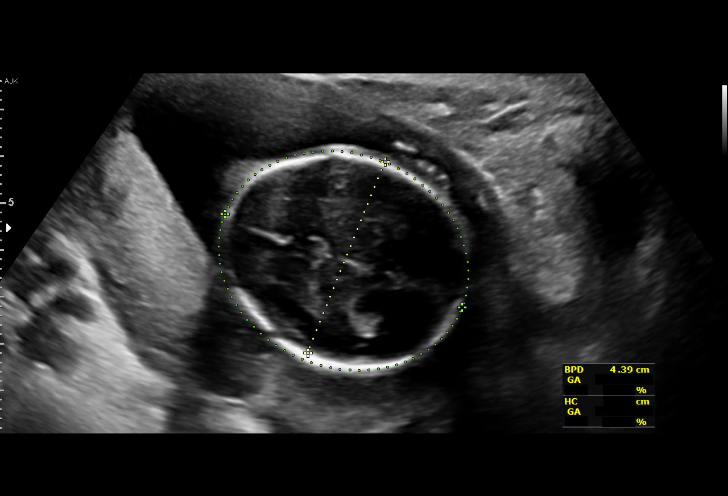
[im 73/110]
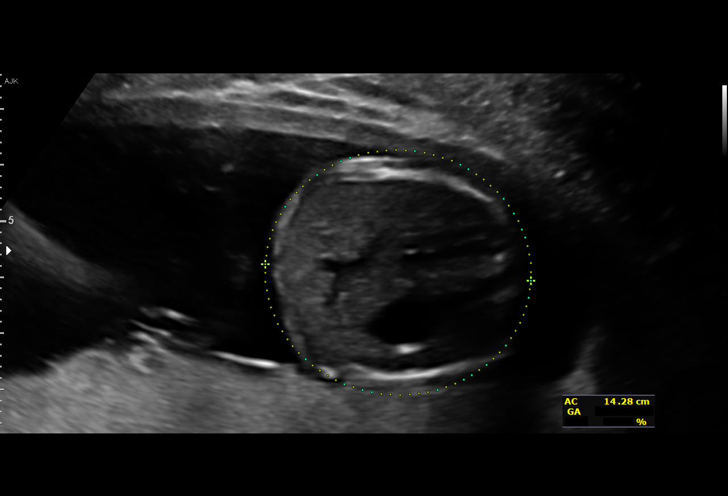
[im 81/110]
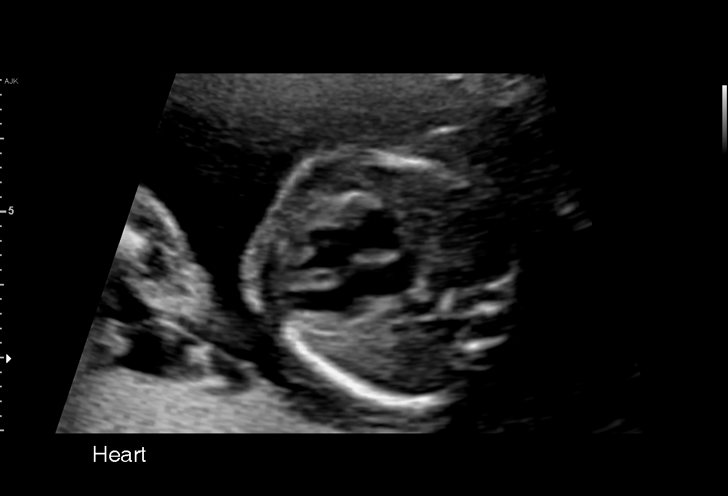
[im 89/110]
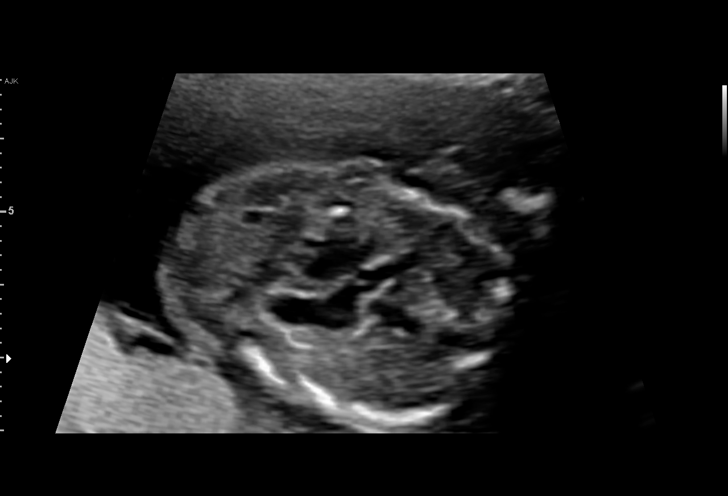
[im 97/110]
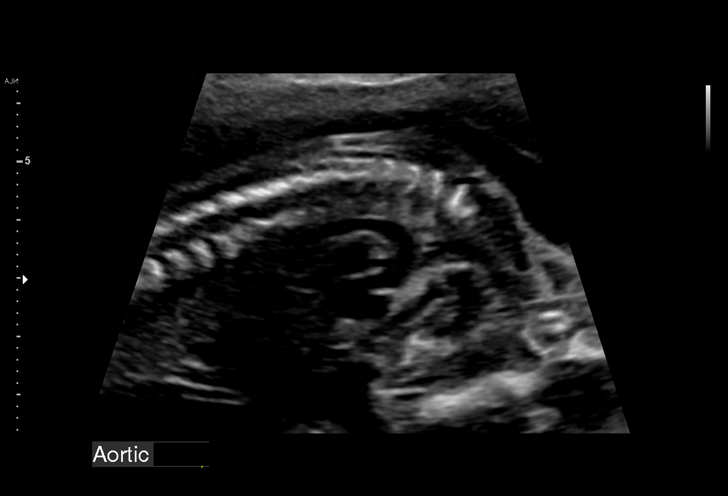
[im 105/110]
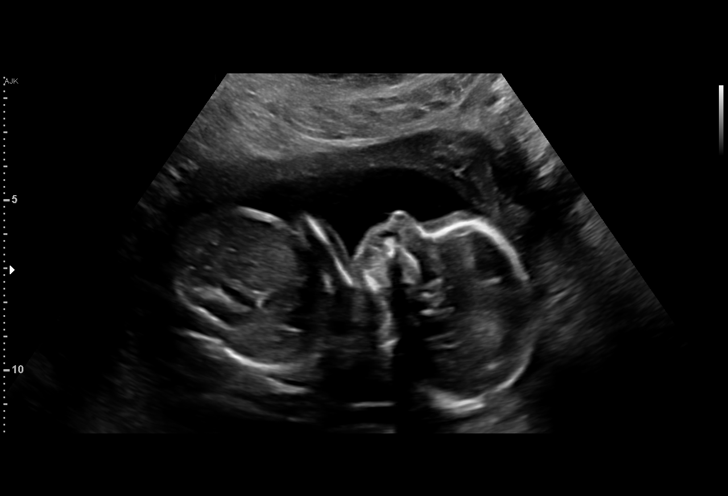

[13 of 28 positions shown; findings below may reference images not displayed]

[address]

 ----------------------------------------------------------------------

 ----------------------------------------------------------------------
Indications

  Obesity complicating pregnancy, second
  trimester
  Advanced maternal age multigravida 35+,
  second trimester (low risk NIPs)
  Hypertension - Chronic/Pre-existing
  (labetalol)
  18 weeks gestation of pregnancy
 ----------------------------------------------------------------------
Vital Signs

 BMI:
Fetal Evaluation

 Num Of Fetuses:         1
 Fetal Heart Rate(bpm):  142
 Cardiac Activity:       Observed
 Presentation:           Cephalic
 Placenta:               Posterior
 P. Cord Insertion:      Visualized

 Amniotic Fluid
 AFI FV:      Within normal limits

                             Largest Pocket(cm)

Biometry

 BPD:      44.7  mm     G. Age:  19w 4d         86  %    CI:        72.86   %    70 - 86
                                                         FL/HC:      16.8   %    16.1 -
 HC:      166.5  mm     G. Age:  19w 2d         78  %    HC/AC:      1.20        1.09 -
 AC:       139   mm     G. Age:  19w 2d         71  %    FL/BPD:     62.4   %
 FL:       27.9  mm     G. Age:  18w 4d         43  %    FL/AC:      20.1   %    20 - 24
 HUM:      26.9  mm     G. Age:  18w 4d         52  %
 CER:      19.9  mm     G. Age:  19w 0d         64  %

 LV:        6.7  mm

 Est. FW:     269  gm      0 lb 9 oz     53  %
Gestational Age

 U/S Today:     19w 1d                                        EDD:   11/23/18
 Best:          18w 4d     Det. By:  U/S C R L  (04/17/18)    EDD:   11/27/18
Anatomy

 Cranium:               Appears normal         Aortic Arch:            Appears normal
 Cavum:                 Appears normal         Ductal Arch:            Appears normal
 Ventricles:            Appears normal         Diaphragm:              Appears normal
 Choroid Plexus:        Appears normal         Stomach:                Appears normal, left
                                                                       sided
 Cerebellum:            Appears normal         Abdomen:                Appears normal
 Posterior Fossa:       Not well visualized    Abdominal Wall:         Appears nml (cord
                                                                       insert, abd wall)
 Nuchal Fold:           Not well visualized    Cord Vessels:           Appears normal (3
                                                                       vessel cord)
 Face:                  Orbits nl; profile not Kidneys:                Appear normal
                        well visualized
 Lips:                  Not well visualized    Bladder:                Appears normal
 Thoracic:              Appears normal         Spine:                  Appears normal
 Heart:                 Echogenic focus        Upper Extremities:      Appears normal
                        in LV
 RVOT:                  Appears normal         Lower Extremities:      Appears normal
 LVOT:                  Appears normal

 Other:  Fetus appears to be a male. Heels and 5th digit visualized.
         Technically difficult due to maternal habitus and fetal position.
Cervix Uterus Adnexa

 Cervix
 Length:           3.49  cm.
 Normal appearance by transabdominal scan.

 Uterus
 No abnormality visualized.

 Left Ovary
 Within normal limits.

 Right Ovary
 Within normal limits.

 Adnexa
 No abnormality visualized.
Impression

 Normal interval growth.  No ultrasonic evidence of structural
 fetal anomalies.
 Isolated left ventricular echogenic intracardiac foci- we
 discussed the low risk nature of this finding in the context of a
 low risk NIPS and no additional findings are noted.
 Chronic hypertension on medications. BP stable
 AMA- 32 yo
Recommendations

 Repeat growth in 4-5 weeks.
 Continue serial growth every 4 weeks
 Initiate weekly testing at 32 weeks.
 Delivery at 37-39 weeks pending BP control

## 2019-10-03 ENCOUNTER — Other Ambulatory Visit: Payer: Self-pay

## 2019-10-03 ENCOUNTER — Other Ambulatory Visit: Payer: No Typology Code available for payment source

## 2019-10-03 DIAGNOSIS — Z20822 Contact with and (suspected) exposure to covid-19: Secondary | ICD-10-CM

## 2019-10-04 LAB — NOVEL CORONAVIRUS, NAA: SARS-CoV-2, NAA: NOT DETECTED

## 2019-10-04 LAB — SARS-COV-2, NAA 2 DAY TAT

## 2019-10-07 IMAGING — US US MFM OB FOLLOW UP
1 series · 13 of 28 positions shown · non-contrast
Comparison: none

[Series 1: us mfm ob follow up · 43 acquisitions, 13 frames shown]
[im 2/43]
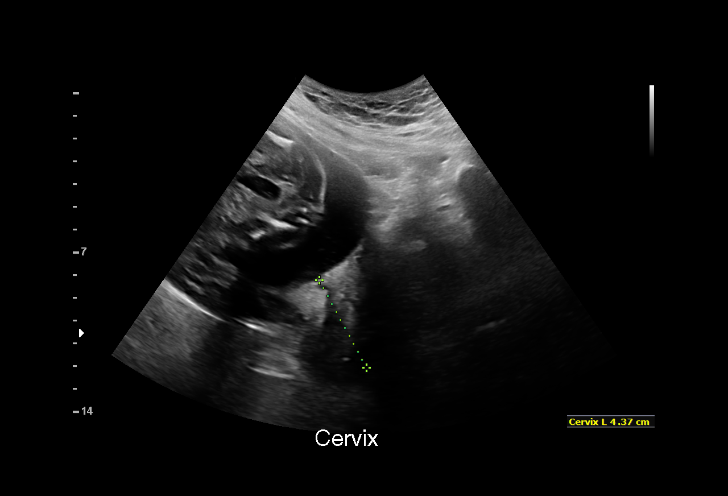
[im 5/43]
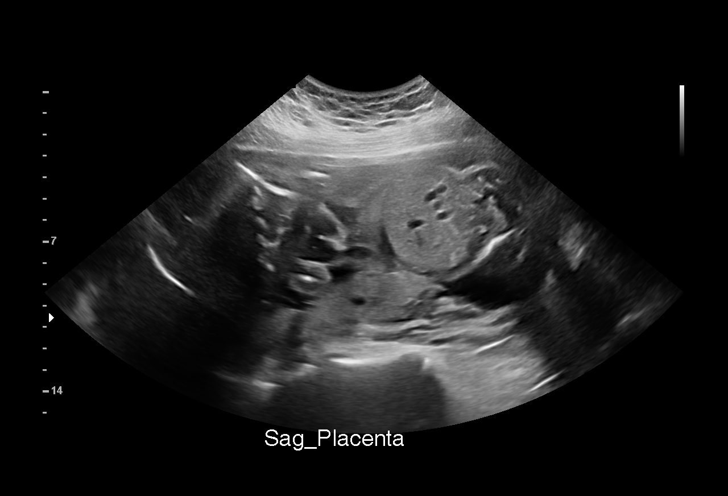
[im 8/43]
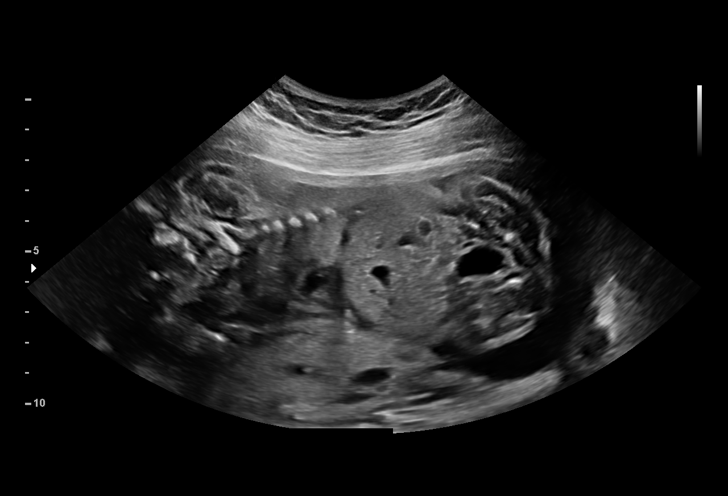
[im 11/43]
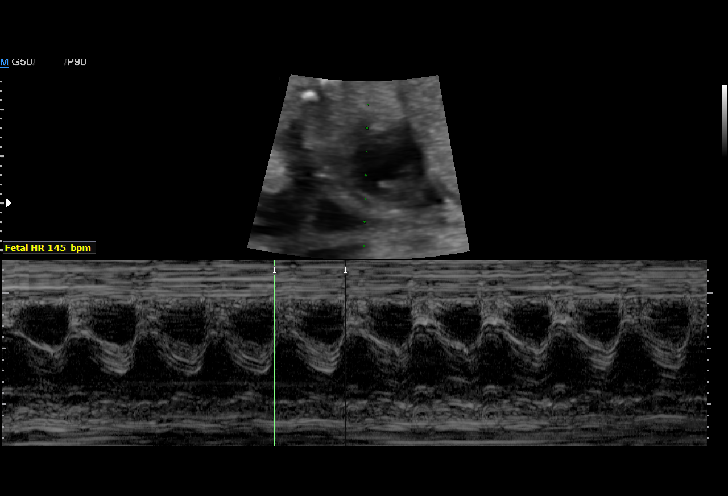
[im 15/43]
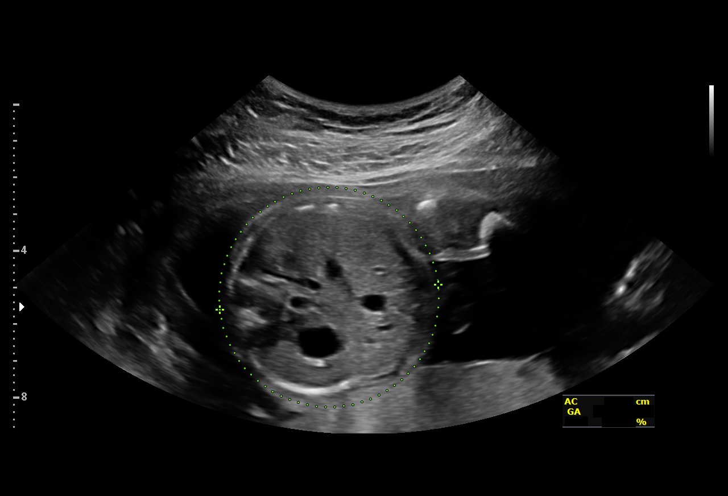
[im 18/43]
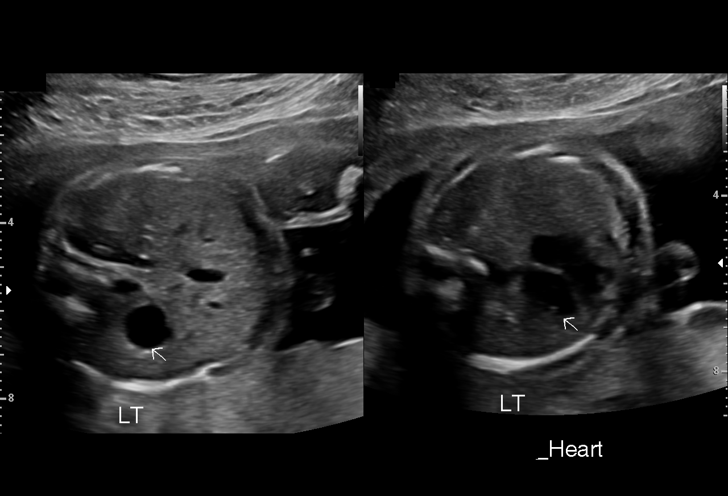
[im 22/43]
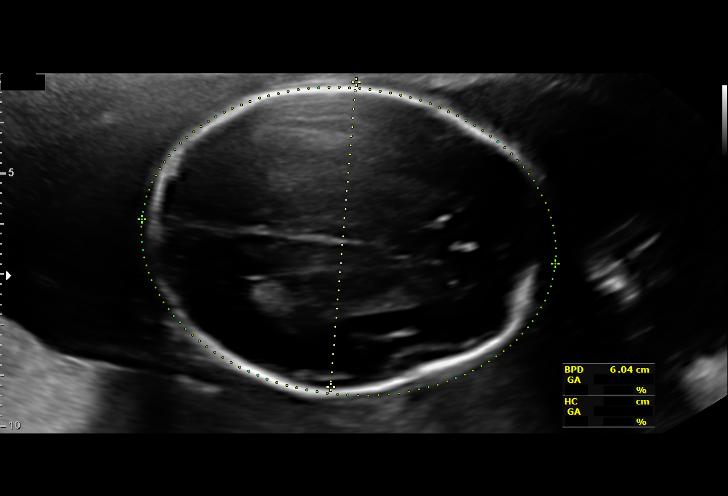
[im 25/43]
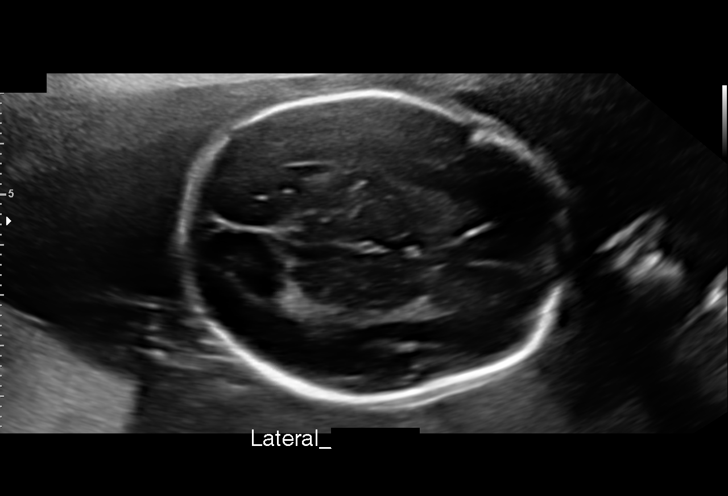
[im 29/43]
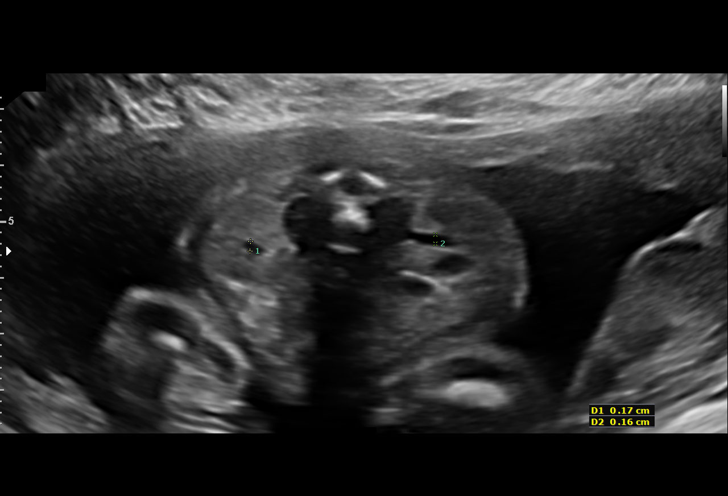
[im 32/43]
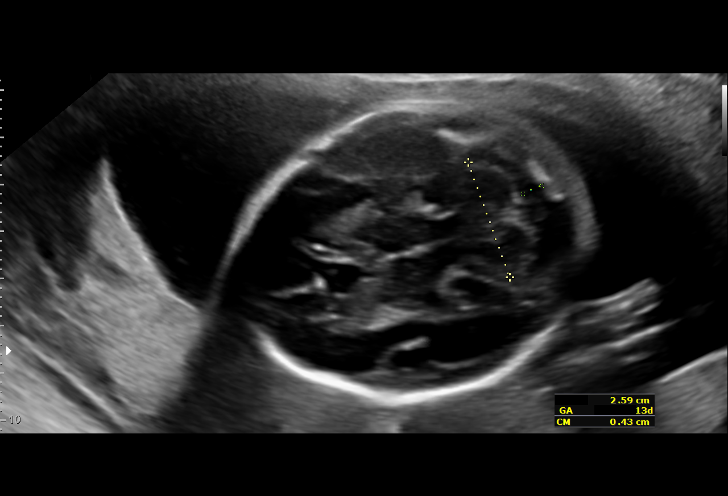
[im 35/43]
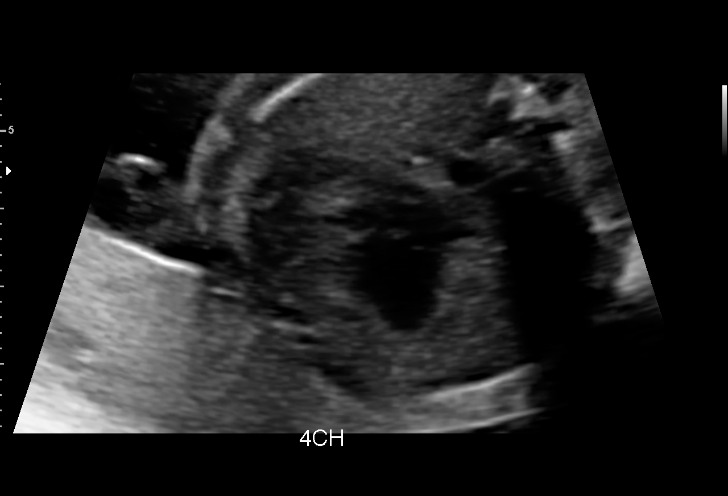
[im 38/43]
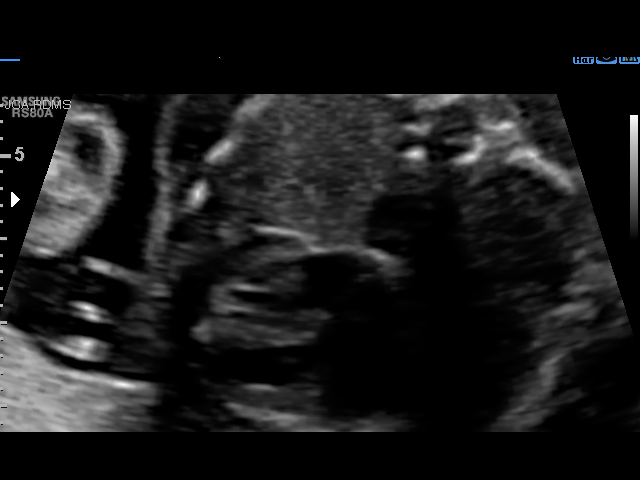
[im 41/43]
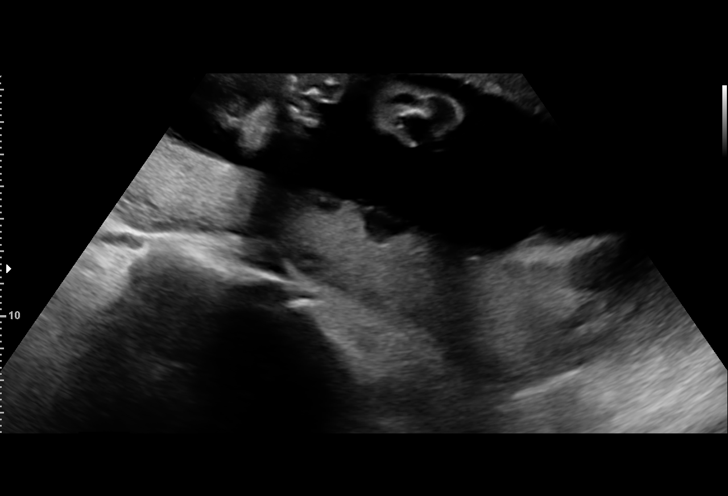

[13 of 28 positions shown; findings below may reference images not displayed]

Suite A

                                                       GUH
 ----------------------------------------------------------------------

 ----------------------------------------------------------------------
Indications

  Encounter for other antenatal screening
  follow-up ( Low Risk NIPS)
  Obesity complicating pregnancy, second
  trimester (BMI 37.11)
  Advanced maternal age multigravida 35+,
  second trimester
  Hypertension - Chronic/Pre-existing
  (labetalol)
  23 weeks gestation of pregnancy
 ----------------------------------------------------------------------
Vital Signs

                                                Height:        5'8"
Fetal Evaluation

 Num Of Fetuses:         1
 Fetal Heart Rate(bpm):  149
 Cardiac Activity:       Observed
 Presentation:           Breech
 Placenta:               Posterior
 P. Cord Insertion:      Previously Visualized

 Amniotic Fluid
 AFI FV:      Within normal limits

                             Largest Pocket(cm)

Biometry

 BPD:        60  mm     G. Age:  24w 4d         66  %    CI:        74.64   %    70 - 86
                                                         FL/HC:      18.5   %    18.7 -
 HC:      220.4  mm     G. Age:  24w 0d         41  %    HC/AC:      1.17        1.05 -
 AC:      188.1  mm     G. Age:  23w 4d         33  %    FL/BPD:     67.8   %    71 - 87
 FL:       40.7  mm     G. Age:  23w 1d         19  %    FL/AC:      21.6   %    20 - 24
 HUM:      39.4  mm     G. Age:  24w 0d         47  %
 CER:      25.9  mm     G. Age:  23w 6d         51  %

 LV:        5.2  mm
 CM:        4.3  mm

 Est. FW:     602  gm      1 lb 5 oz     45  %
Gestational Age

 U/S Today:     23w 6d                                        EDD:   11/23/18
 Best:          23w 6d     Det. By:  U/S  (06/30/18)          EDD:   11/23/18
Anatomy

 Cranium:               Appears normal         Aortic Arch:            Previously seen
 Cavum:                 Appears normal         Ductal Arch:            Previously seen
 Ventricles:            Appears normal         Diaphragm:              Appears normal
 Choroid Plexus:        Previously seen        Stomach:                Appears normal, left
                                                                       sided
 Cerebellum:            Appears normal         Abdomen:                Appears normal
 Posterior Fossa:       Appears normal         Abdominal Wall:         Previously seen
 Nuchal Fold:           Not applicable (>20    Cord Vessels:           Previously seen
                        wks GA)
 Face:                  Orbits previously      Kidneys:                Appear normal
                        seen
 Lips:                  Appears normal         Bladder:                Appears normal
 Thoracic:              Appears normal         Spine:                  Previously seen
 Heart:                 Echogenic focus        Upper Extremities:      Previously seen
                        in LV
 RVOT:                  Previously seen        Lower Extremities:      Previously seen
 LVOT:                  Previously seen

 Other:  Fetus appears to be a male. Heels and 5th digit visualized.
         Technically difficult due to maternal habitus and fetal position.
Cervix Uterus Adnexa

 Cervix
 Length:           4.09  cm.
 Normal appearance by transabdominal scan.
Impression

 Patient returned for completion of fetal anatomy. Amniotic
 fluid is normal and good fetal activity is seen. Fetal growth is
 appropriate for gestational age. Fetal anatomical survey was
 completed and appears normal.

 Chronic hypertension. Well-controlled on labetalol.
Recommendations

 An appointment was made for her to return in 4 weeks for
 fetal growth assessment.
                 Leja, Md [place]

## 2020-01-06 IMAGING — US US MFM FETAL BPP W/O NON-STRESS
1 series · 13 of 28 positions shown · non-contrast
Comparison: none

[Series 1: us mfm fetal bpp w/o non-stress · 36 acquisitions, 13 frames shown]
[im 2/36]
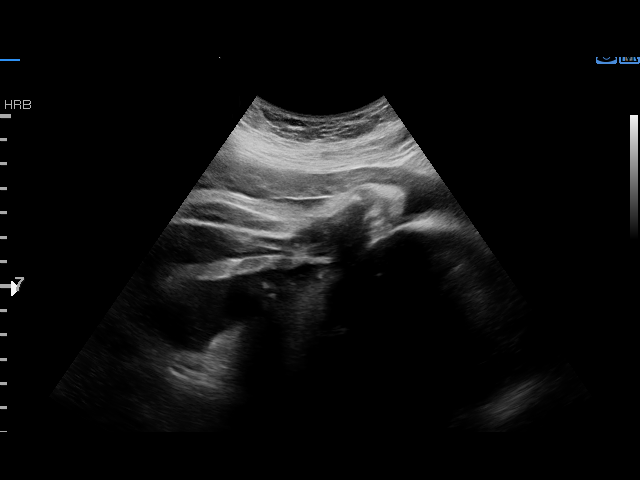
[im 4/36]
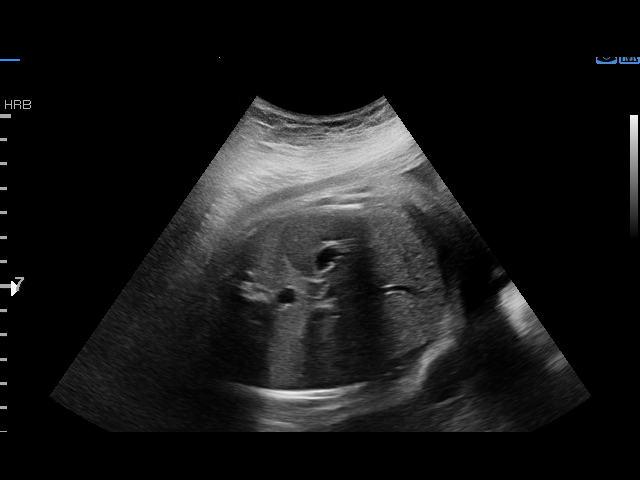
[im 7/36]
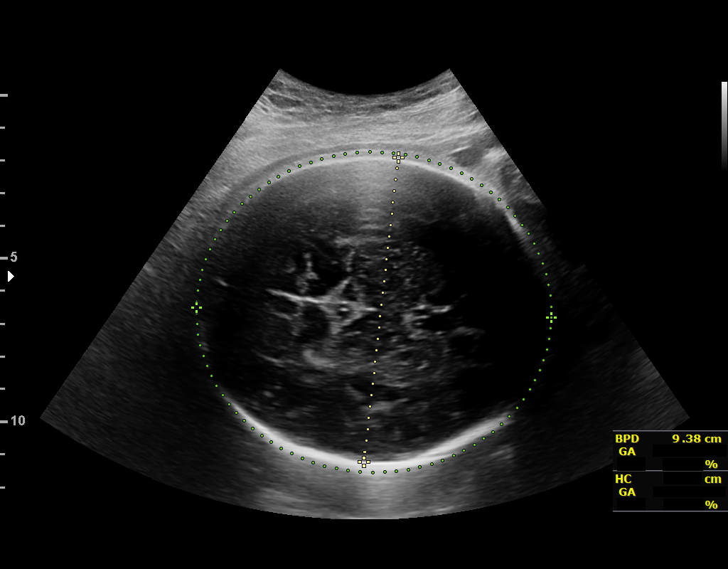
[im 10/36]
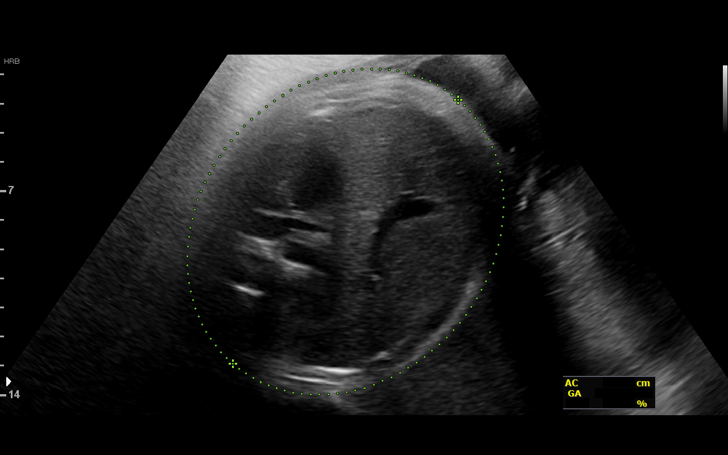
[im 12/36]
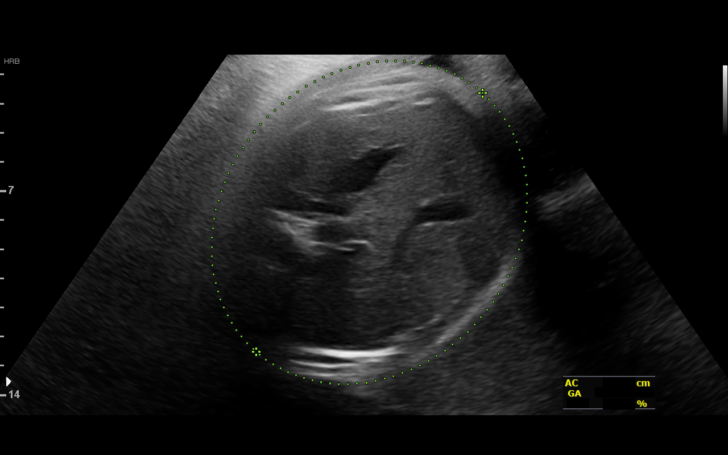
[im 15/36]
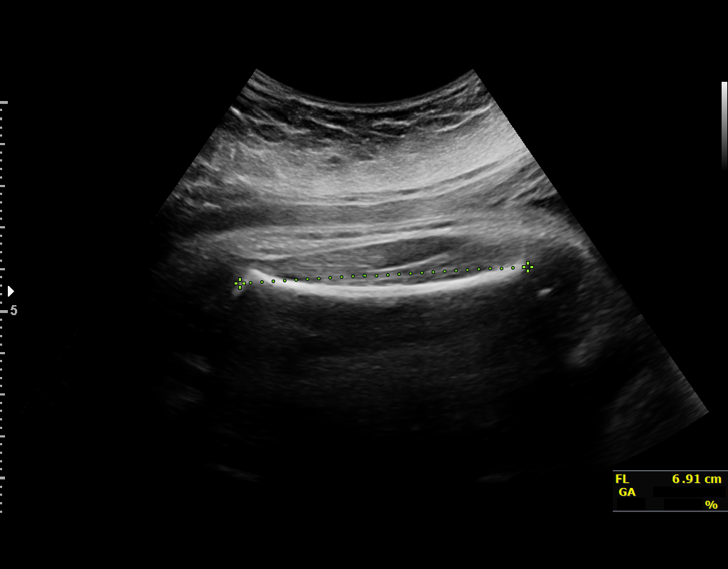
[im 19/36]
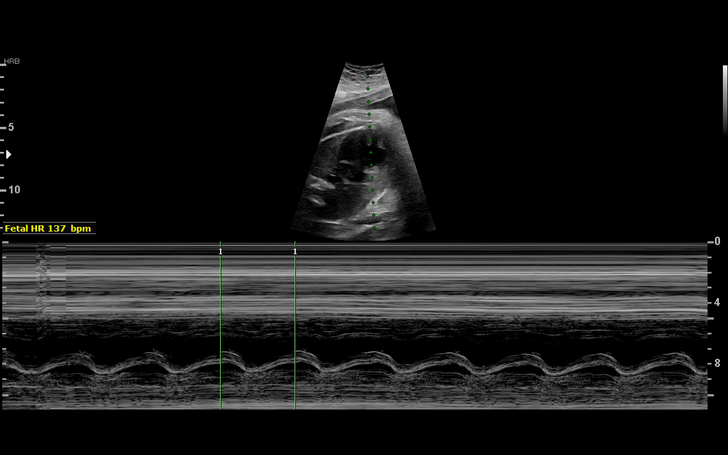
[im 21/36]
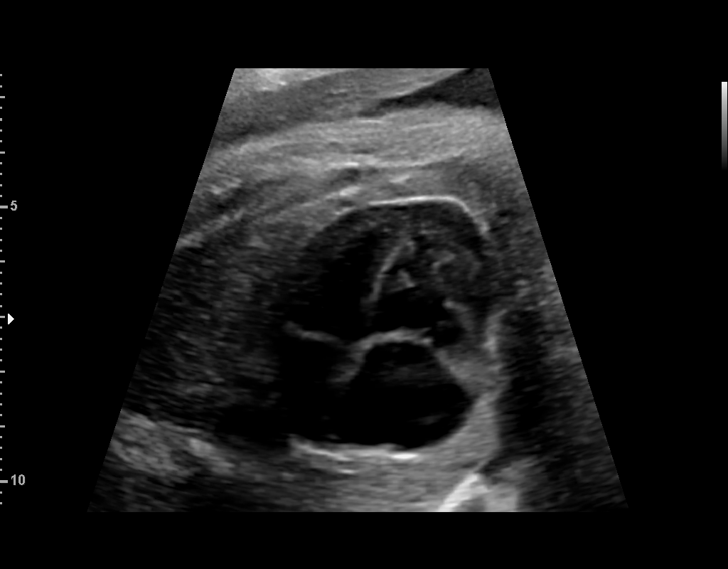
[im 24/36]
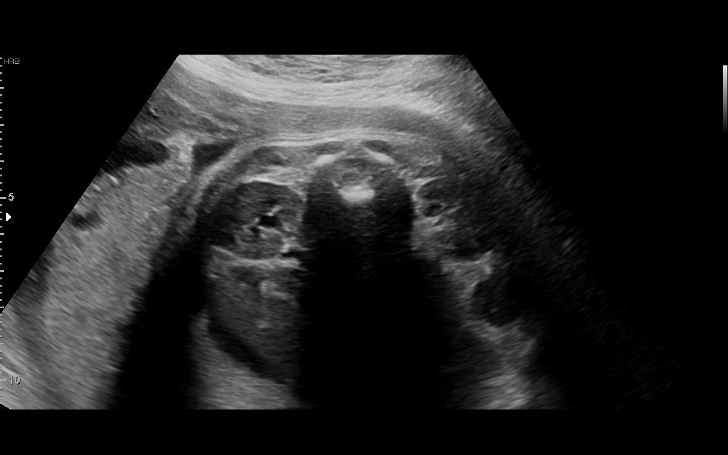
[im 26/36]
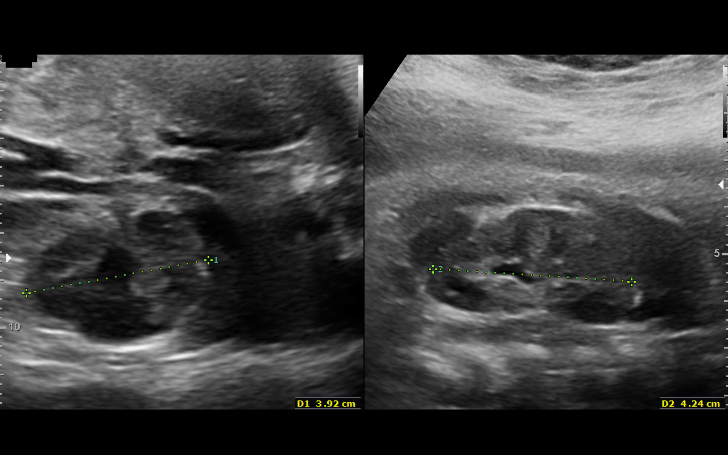
[im 29/36]
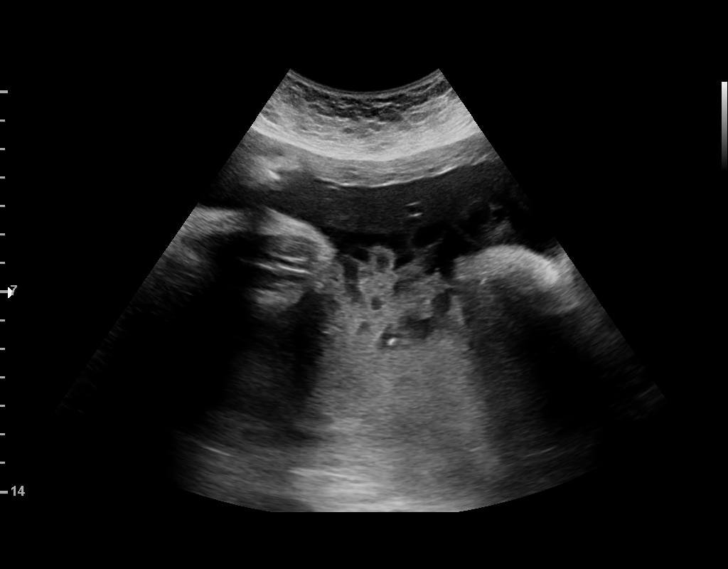
[im 32/36]
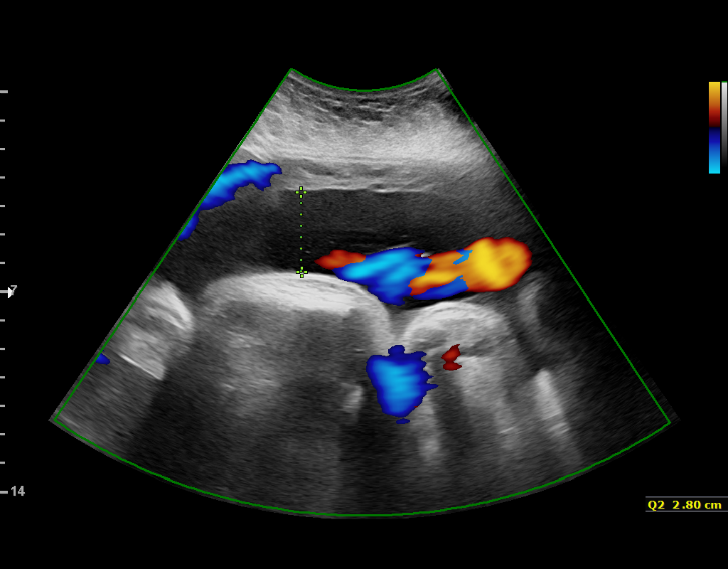
[im 34/36]
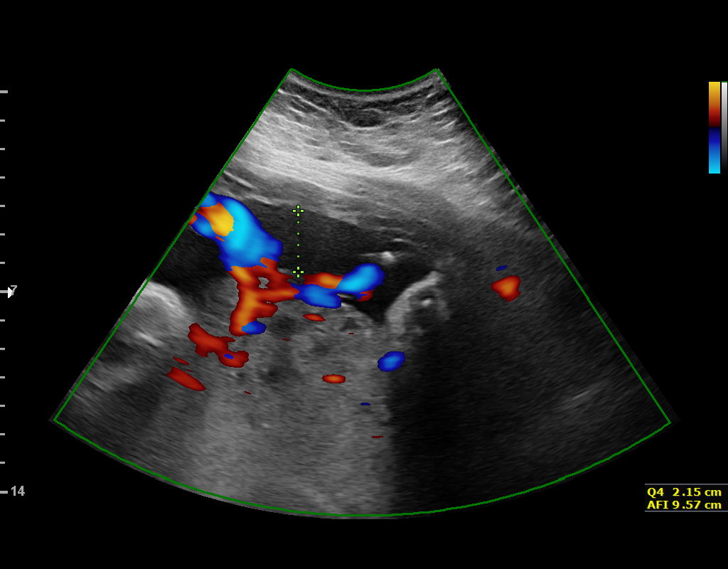

[13 of 28 positions shown; findings below may reference images not displayed]

[address]

     STRESS                                            RODMA
                                                       RODMA
 ----------------------------------------------------------------------

 ----------------------------------------------------------------------
Indications

  Gestational diabetes in pregnancy,
  controlled by oral hypoglycemic drugs
  (metformin)
  Hypertension - Chronic/Pre-existing
  (labetalol)
  Obesity complicating pregnancy, third
  trimester
  Advanced maternal age multigravida 35+,
  third trimester
  Low Risk NIPS
  36 weeks gestation of pregnancy
 ----------------------------------------------------------------------
Vital Signs

 BMI:
Fetal Evaluation

 Num Of Fetuses:          1
 Fetal Heart Rate(bpm):   137
 Cardiac Activity:        Observed
 Presentation:            Cephalic
 Placenta:                Posterior
 P. Cord Insertion:       Previously Visualized

 Amniotic Fluid
 AFI FV:      Within normal limits

 AFI Sum(cm)     %Tile       Largest Pocket(cm)
 9.57            20
 RUQ(cm)       RLQ(cm)       LUQ(cm)        LLQ(cm)
 0
Biophysical Evaluation

 Amniotic F.V:   Within normal limits       F. Tone:         Observed
 F. Movement:    Observed                   Score:           [DATE]
 F. Breathing:   Observed
Biometry

 BPD:      94.1  mm     G. Age:  38w 2d         93  %    CI:        83.58   %    70 - 86
                                                         FL/HC:       21.4  %    20.8 -
 HC:      324.5  mm     G. Age:  36w 5d         21  %    HC/AC:       0.94       0.92 -
 AC:      343.5  mm     G. Age:  38w 2d         92  %    FL/BPD:      73.9  %    71 - 87
 FL:       69.5  mm     G. Age:  35w 5d         19  %    FL/AC:       20.2  %    20 - 24
 HUM:      60.9  mm     G. Age:  35w 2d         40  %

 LV:          5  mm

 Est. FW:    0339   gm     7 lb 2 oz     73  %
Gestational Age

 U/S Today:     37w 2d                                        EDD:   11/20/18
 Best:          36w 6d     Det. By:  U/S  (06/30/18)          EDD:   11/23/18
Anatomy

 Cranium:               Appears normal         Aortic Arch:            Previously seen
 Cavum:                 Appears normal         Ductal Arch:            Previously seen
 Ventricles:            Appears normal         Diaphragm:              Appears normal
 Choroid Plexus:        Previously seen        Stomach:                Appears normal, left
                                                                       sided
 Cerebellum:            Previously seen        Abdomen:                Appears normal
 Posterior Fossa:       Previously seen        Abdominal Wall:         Previously seen
 Nuchal Fold:           Not applicable (>20    Cord Vessels:           Previously seen
                        wks GA)
 Face:                  Orbits previously      Kidneys:                Appear normal
                        seen
 Lips:                  Previously seen        Bladder:                Appears normal
 Thoracic:              Appears normal         Spine:                  Previously seen
 Heart:                 Echogenic focus        Upper Extremities:      Previously seen
                        in LV prev seen
 RVOT:                  Previously seen        Lower Extremities:      Previously seen
 LVOT:                  Previously seen

 Other:  Male gender previously seen. Heels and 5th digit visualized
         previously. Technically difficult due to maternal habitus and fetal
         position.
Cervix Uterus Adnexa

 Cervix
 Not visualized (advanced GA >41wks)
Impression

 Normal interval growth.
 Biophysical profile [DATE]
 Chronic Hypertension on medication
 IRIQ9
 AMA with low risk NIPS
Recommendations

 Continue weekly testing
 Consider delivery between 37-39 weeks given blood sugar
 and blood pressure control

## 2020-01-14 IMAGING — US US MFM FETAL BPP W/O NON-STRESS
1 series · 12 of 28 positions shown · non-contrast
Comparison: none

[Series 1: us mfm fetal bpp w/o non-stress · 30 acquisitions, 12 frames shown]
[im 2/30]
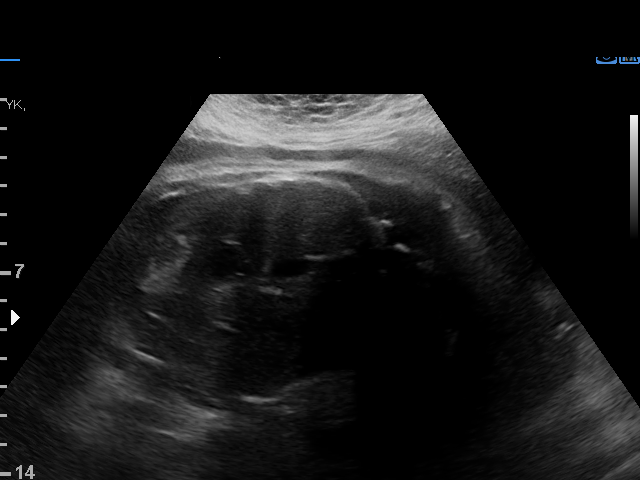
[im 4/30]
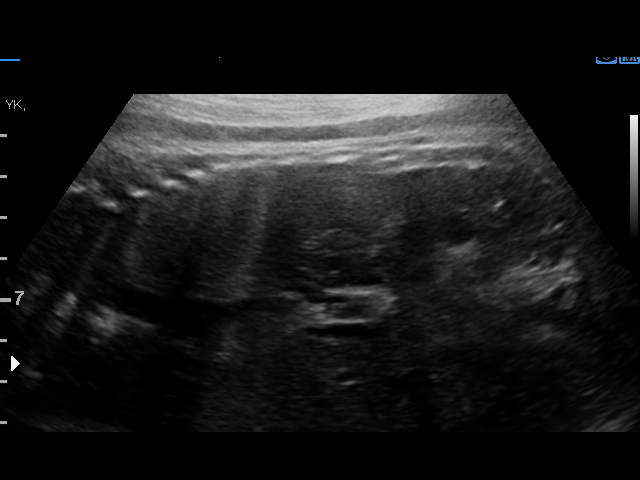
[im 6/30]
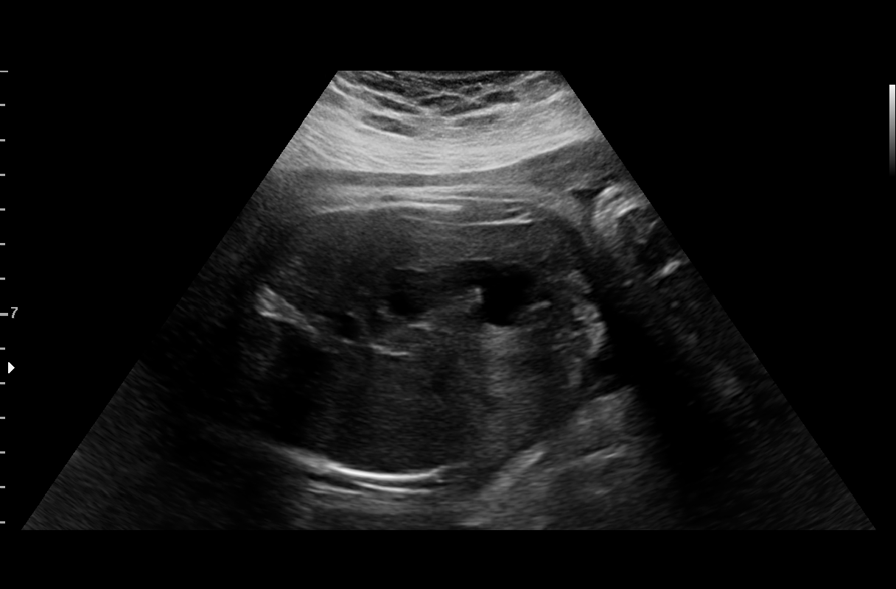
[im 9/30]
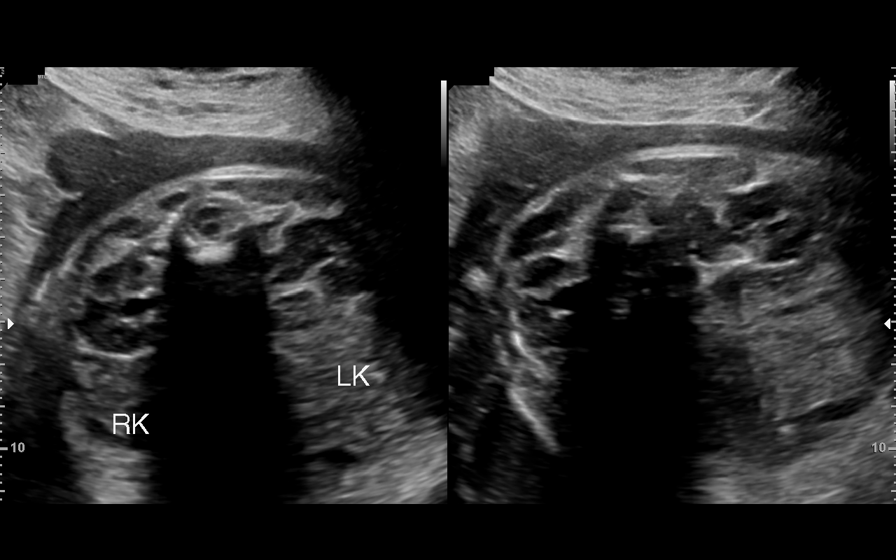
[im 11/30]
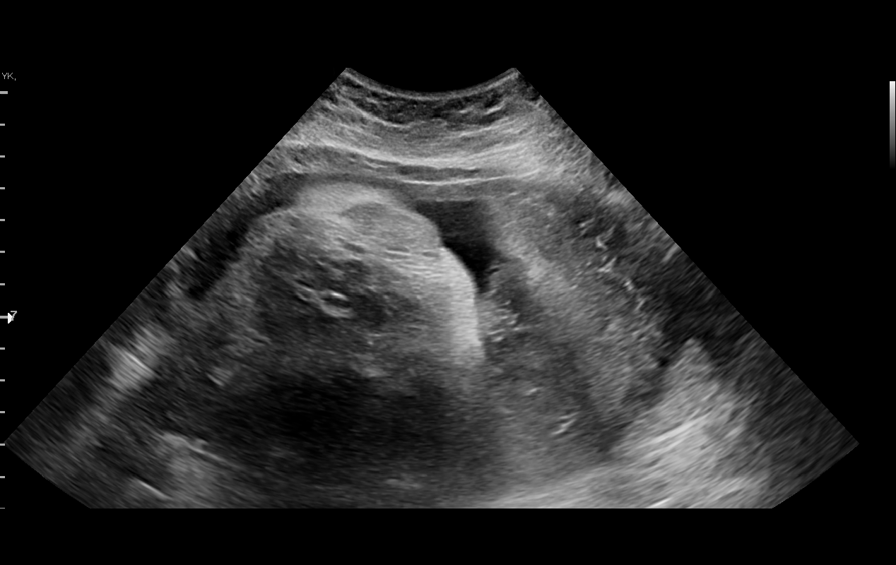
[im 13/30]
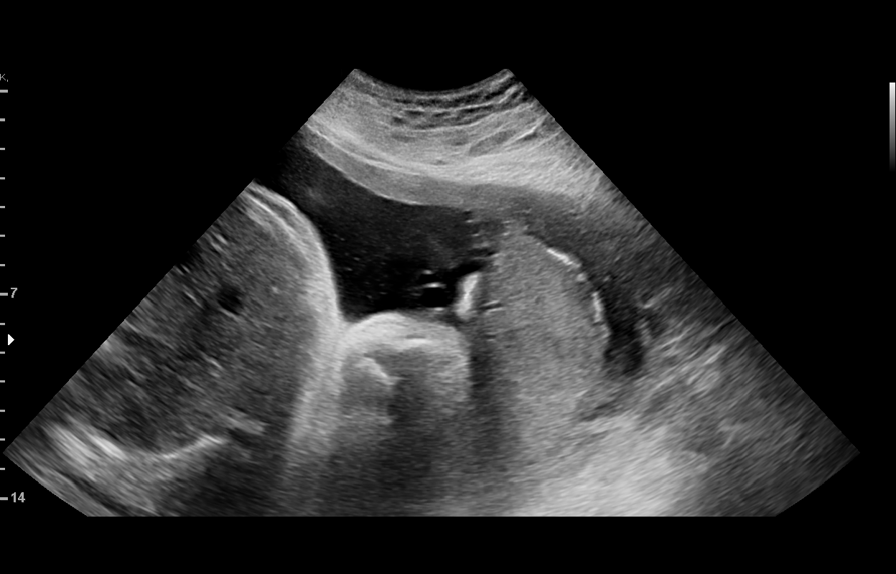
[im 17/30]
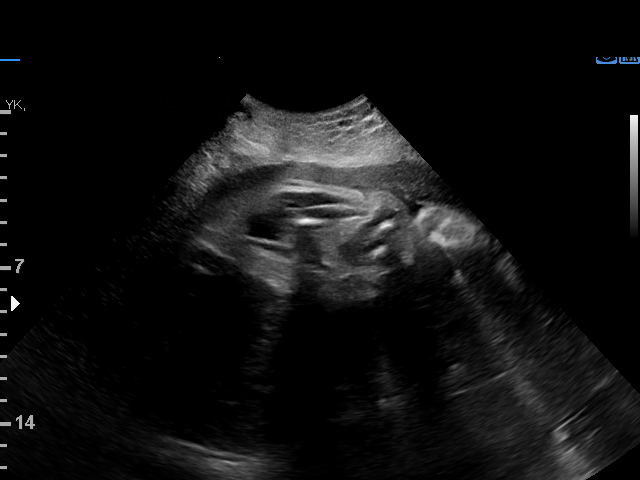
[im 19/30]
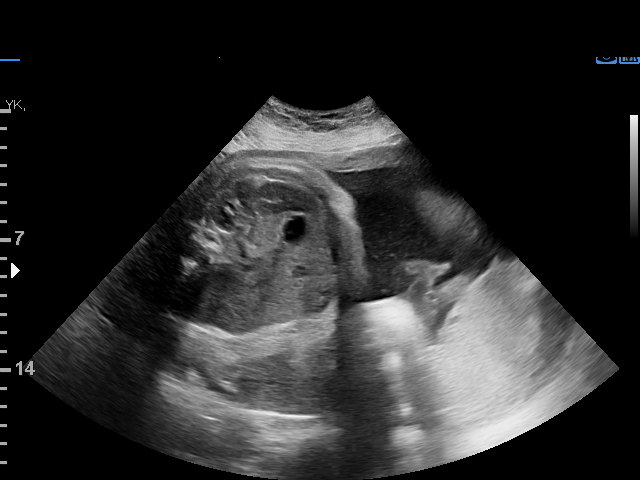
[im 21/30]
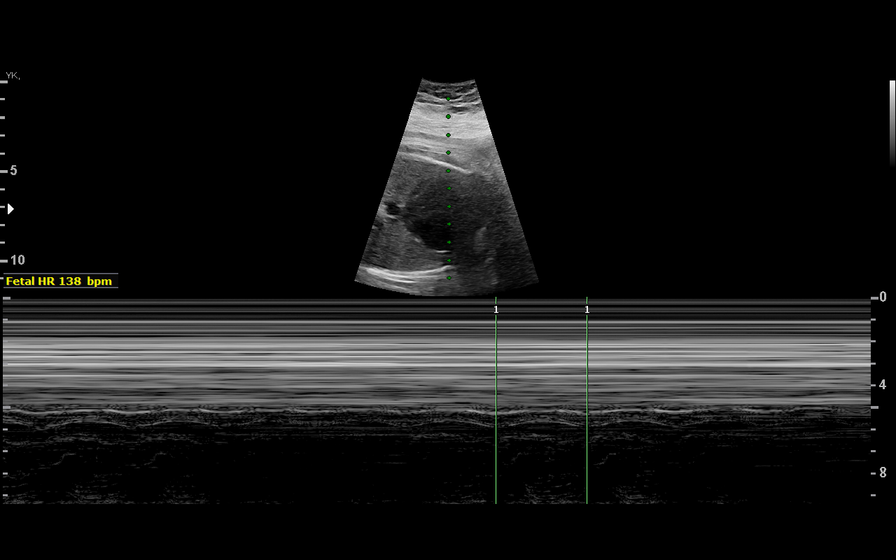
[im 24/30]
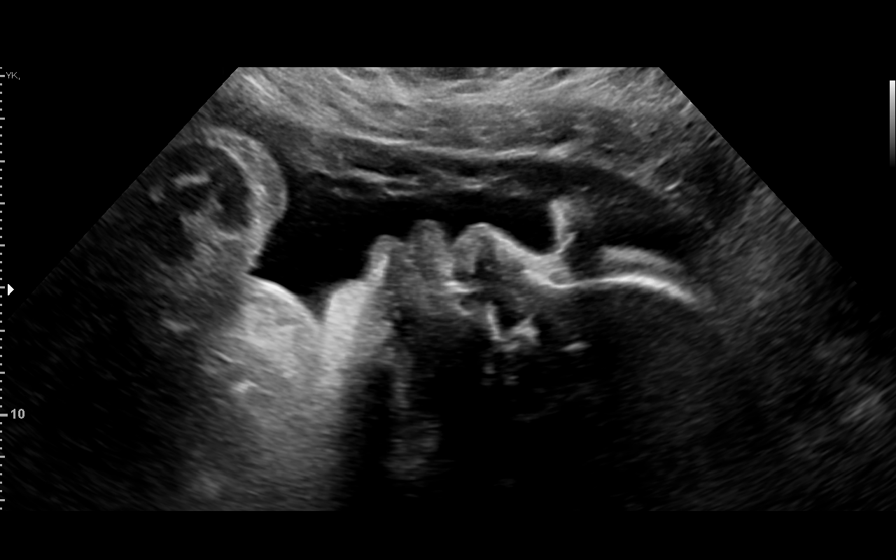
[im 26/30]
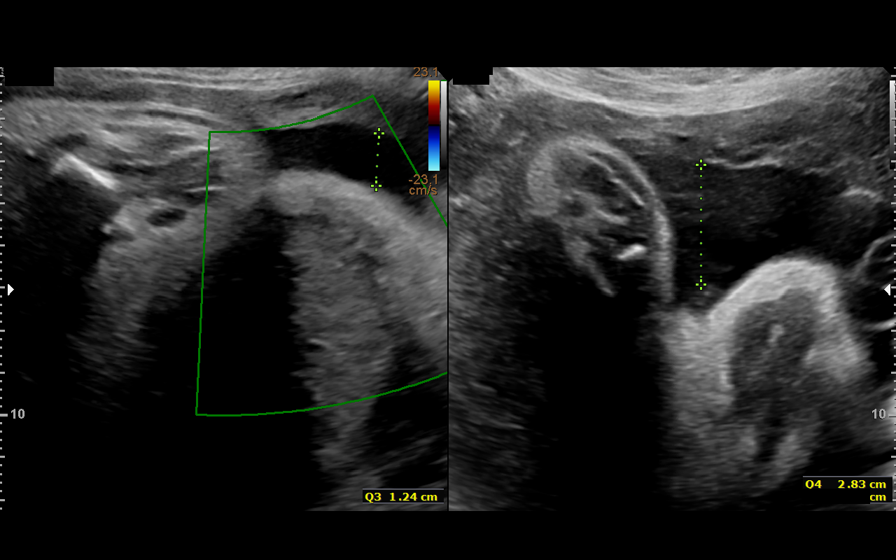
[im 28/30]
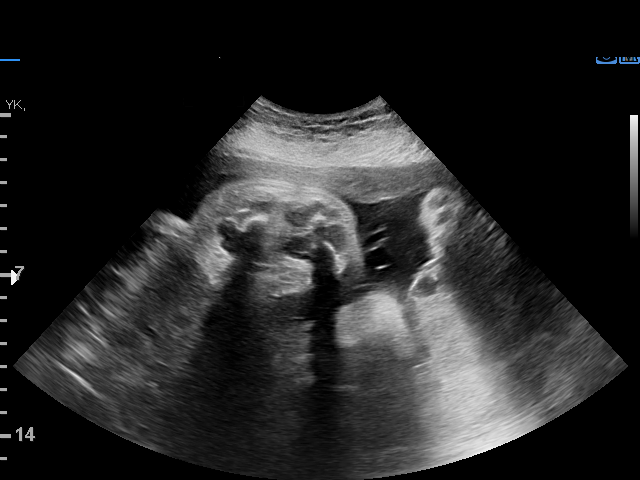

[12 of 28 positions shown; findings below may reference images not displayed]

[address]

     STRESS                                            MARIVANIA
 ----------------------------------------------------------------------

 ----------------------------------------------------------------------
Indications

  Gestational diabetes in pregnancy,
  controlled by oral hypoglycemic drugs
  (metformin)
  Hypertension - Chronic/Pre-existing
  (labetalol)
  Obesity complicating pregnancy, third
  trimester
  Advanced maternal age multigravida 35+,
  third trimester
  Low Risk NIPS
  37 weeks gestation of pregnancy
 ----------------------------------------------------------------------
Vital Signs

                                                Height:        5'8"
Fetal Evaluation

 Num Of Fetuses:         1
 Fetal Heart Rate(bpm):  136
 Cardiac Activity:       Observed
 Presentation:           Cephalic
 Placenta:               Posterior

 Amniotic Fluid
 AFI FV:      Within normal limits

 AFI Sum(cm)     %Tile       Largest Pocket(cm)
 11.11           33

 RUQ(cm)       RLQ(cm)       LUQ(cm)        LLQ(cm)

Biophysical Evaluation

 Amniotic F.V:   Within normal limits       F. Tone:        Observed
 F. Movement:    Observed                   Score:          [DATE]
 F. Breathing:   Observed
Gestational Age

 Best:          37w 3d     Det. By:  U/S C R L  (04/17/18)    EDD:   11/27/18
Anatomy

 Heart:                 Appears normal         Kidneys:                Appear normal
                        (4CH, axis, and
                        situs)
 Stomach:               Appears normal, left   Bladder:                Appears normal
                        sided
Impression

 Chronic hypertension. Well-controlled on labetalol.
 Gestational diabetes. Patient takes metformin for control.
 Amniotic fluid is normal and good fetal activity is seen.
 Antenatal testing is reassuring. BPP [DATE].
 BP at our office is 107/62 mm Hg.

 Patient will undergo induction of labor on 11/20/18.
Recommendations

 -BPP next week.
                 Berwal, Yaami

## 2020-02-08 IMAGING — US US ABDOMEN LIMITED
1 series · 14 of 25 positions shown · non-contrast
Comparison: None.

CLINICAL DATA: Right upper quadrant pain for 1

EXAM:
ULTRASOUND ABDOMEN LIMITED RIGHT UPPER QUADRANT

[Series 1: us abdomen limited · 14 of 51 slices shown]
[im 1/51]
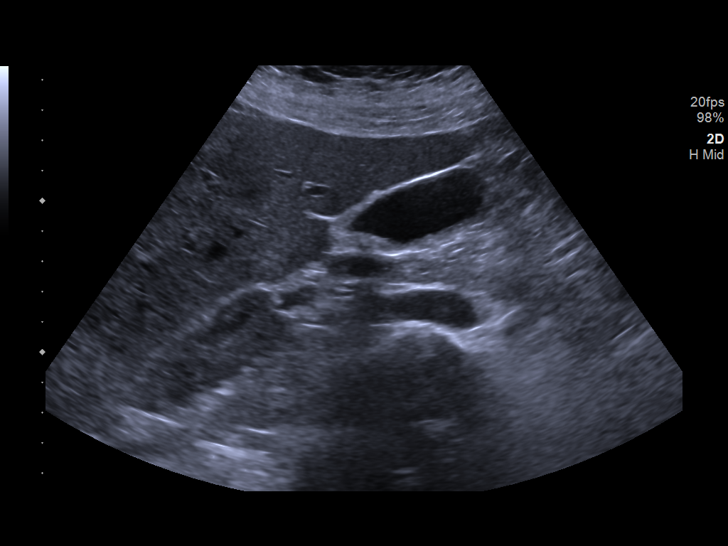
[im 5/51]
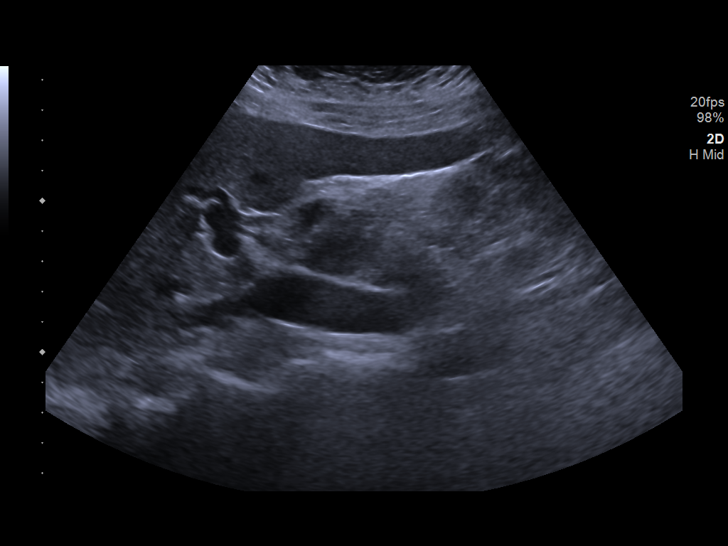
[im 9/51]
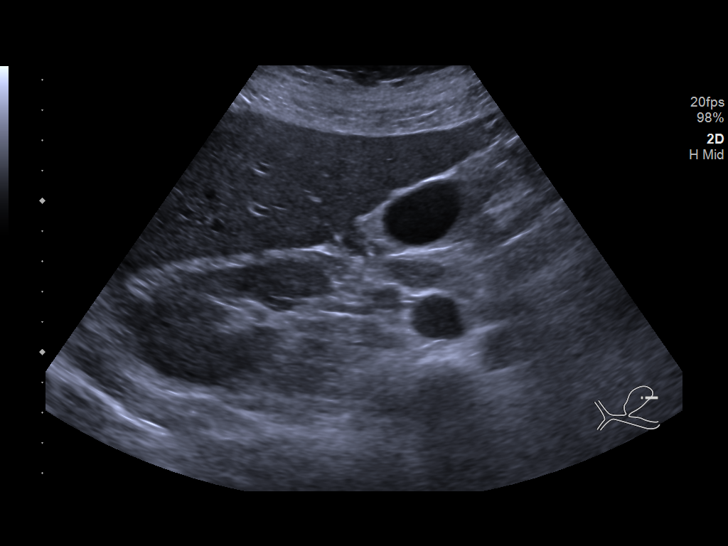
[im 13/51]
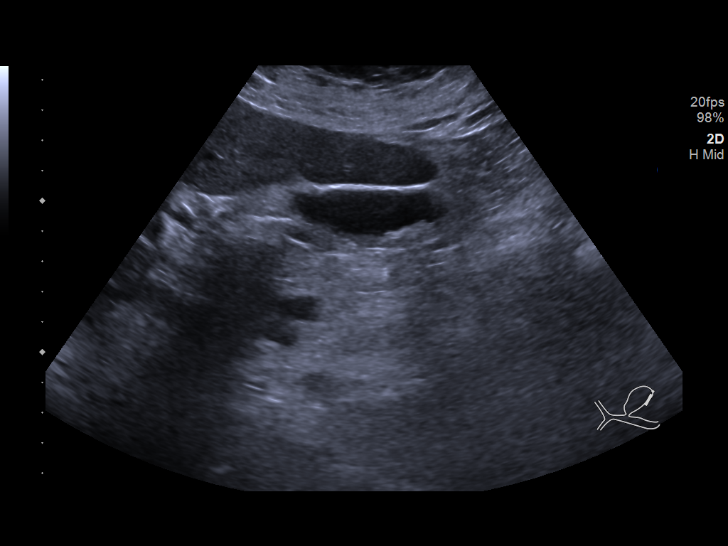
[im 17/51]
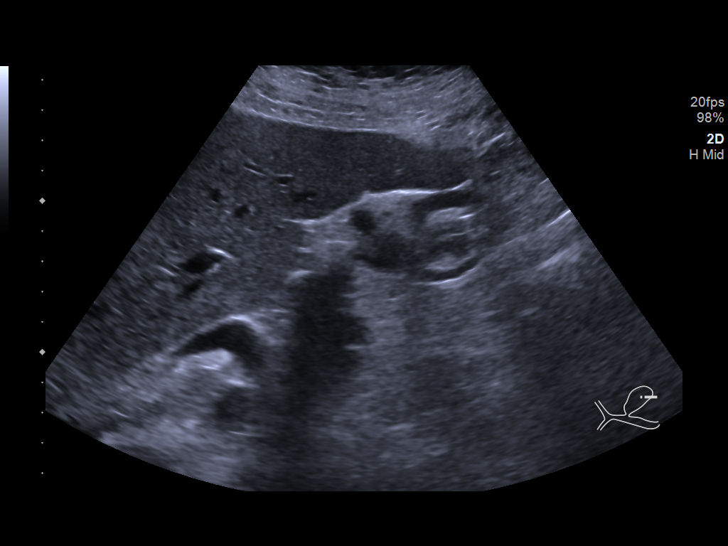
[im 19/51]
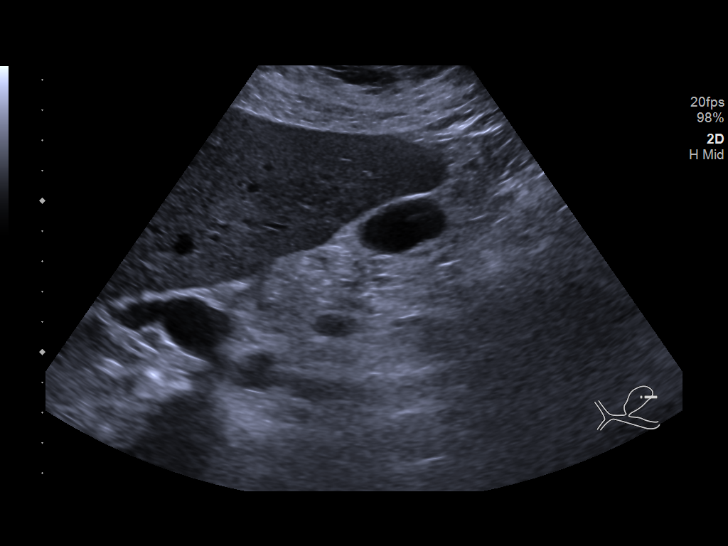
[im 23/51]
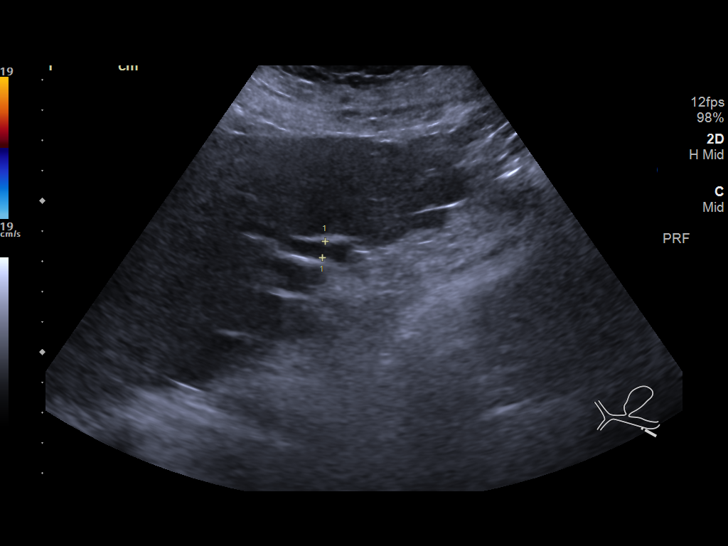
[im 28/51]
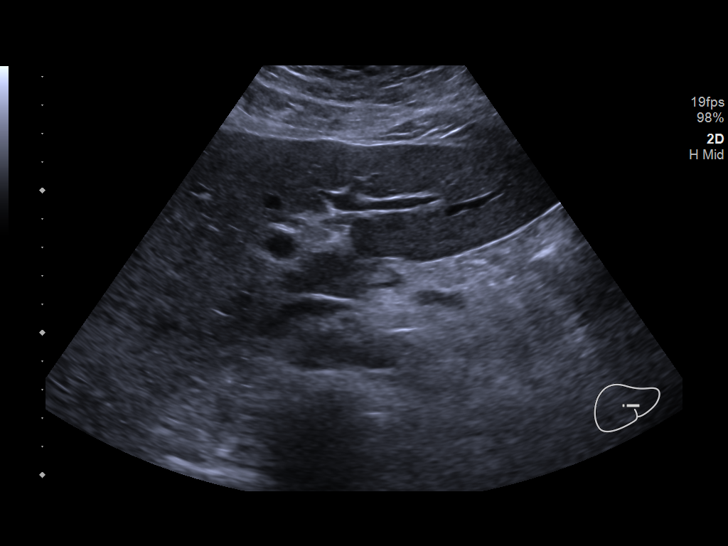
[im 32/51]
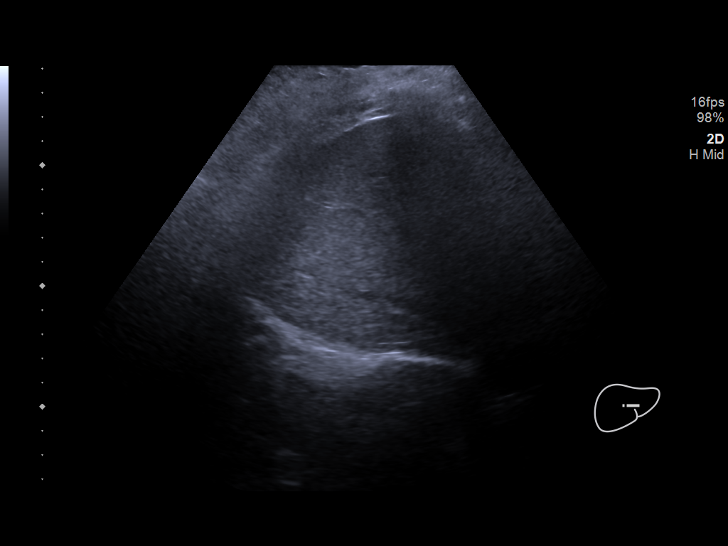
[im 34/51]
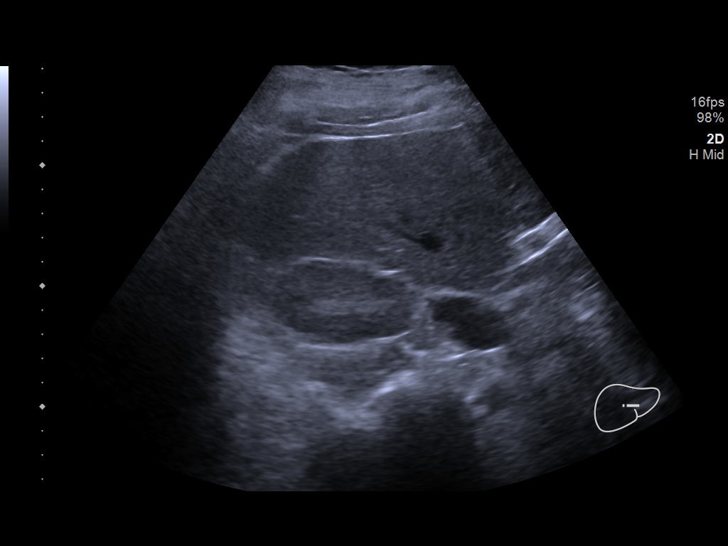
[im 38/51]
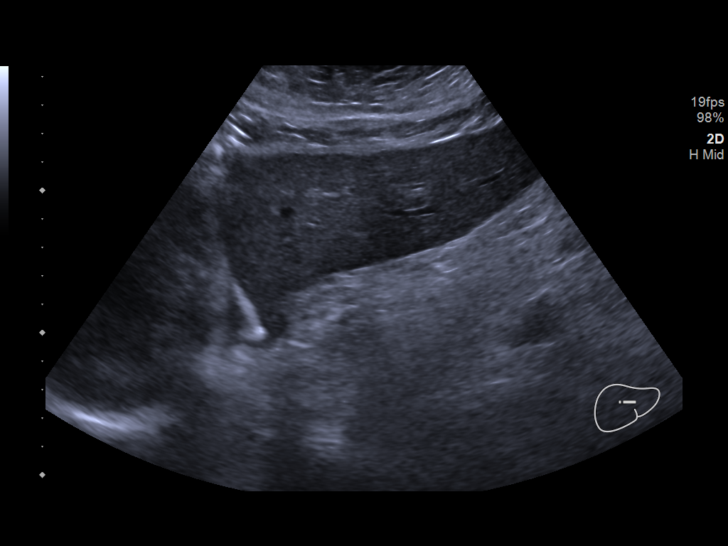
[im 42/51]
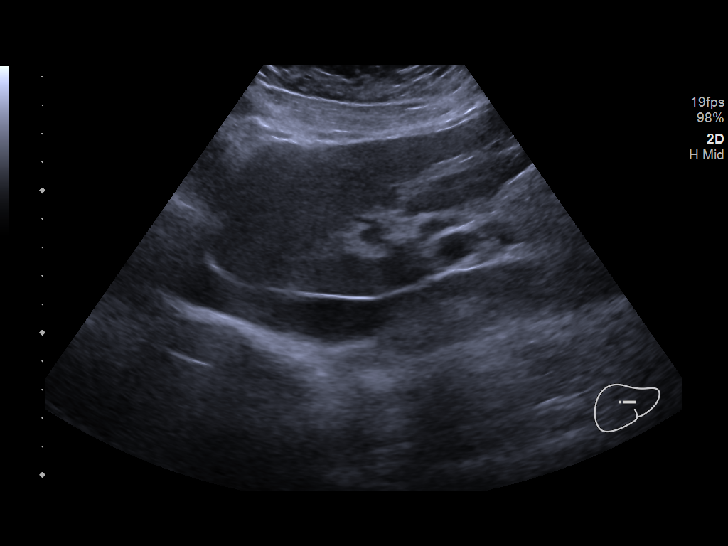
[im 46/51]
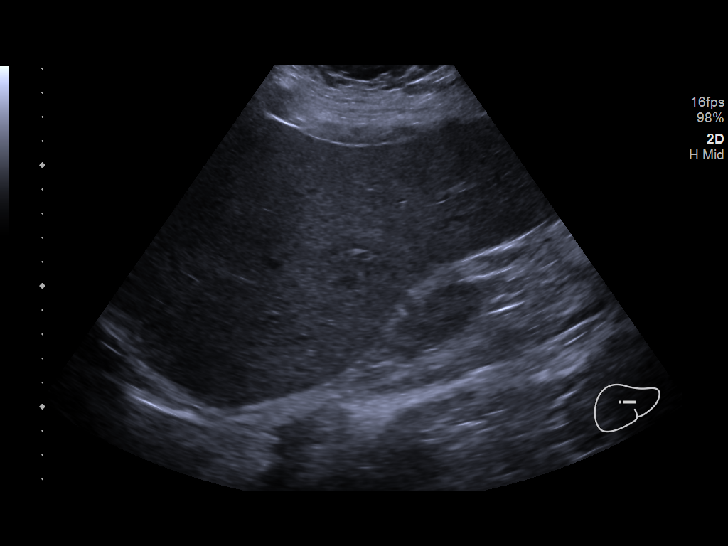
[im 51/51]
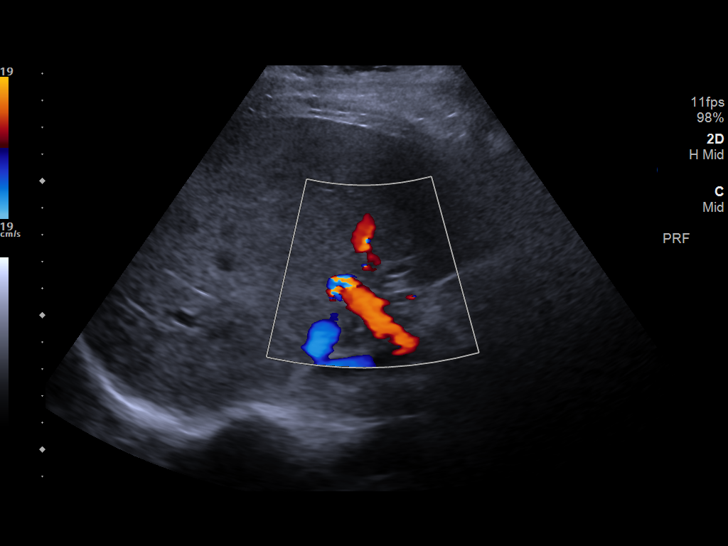

[14 of 25 positions shown; findings below may reference images not displayed]

FINDINGS: Gallbladder:

Probable small amount of sludge within the gallbladder. No
gallstones or wall thickening visualized. No sonographic Murphy sign
noted by sonographer.

Common bile duct:

Diameter: 0.6 cm, within normal limits.

Liver:

No focal lesion identified. Within normal limits in parenchymal
echogenicity. Portal vein is patent on color Doppler imaging with
normal direction of blood flow towards the liver.

Other: None.
IMPRESSION: Probable mild gallbladder sludge without other signs of acute
inflammation.

## 2023-07-24 ENCOUNTER — Ambulatory Visit
Admission: EM | Admit: 2023-07-24 | Discharge: 2023-07-24 | Disposition: A | Discharge status: Home / Self Care | Attending: Physician Assistant | Admitting: Physician Assistant

## 2023-07-24 ENCOUNTER — Encounter: Payer: Self-pay | Admitting: Emergency Medicine

## 2023-07-24 DIAGNOSIS — M5442 Lumbago with sciatica, left side: Secondary | ICD-10-CM

## 2023-07-24 DIAGNOSIS — M546 Pain in thoracic spine: Secondary | ICD-10-CM

## 2023-07-24 MED ORDER — CYCLOBENZAPRINE HCL 10 MG PO TABS: 10.0000 mg | ORAL_TABLET | Freq: Two times a day (BID) | ORAL | 0 refills | Status: AC | PRN

## 2023-07-24 MED ORDER — PREDNISONE 20 MG PO TABS: 40.0000 mg | ORAL_TABLET | Freq: Every day | ORAL | 0 refills | Status: AC

## 2023-07-24 NOTE — ED Triage Notes (Signed)
 Pt presents c/o lower back pain that radiates down her left leg for about 2 weeks. Pt tried some Otc products for relief but they didn't help much.

## 2023-07-24 NOTE — ED Provider Notes (Signed)
 [facility]    CSN: [id number] Arrival date & time: 07/24/23  1347      History   Chief Complaint Chief Complaint  Patient presents with   Back Pain    HPI Casey Mcmahon is a 38 y.o. female.   Patient presents today for evaluation of lower back pain that radiates down her left leg has been ongoing for the last few weeks.  She reports she has not had anything like this in the past.  She denies any injury.  She has tried over-the-counter products without significant improvement.  She does report some thoracic back pain as well.  She denies any numbness.  The history is provided by the patient.  Back Pain Associated symptoms: no abdominal pain, no fever and no numbness     Past Medical History:  Diagnosis Date   Asthma    prn inhaler   Asthma, moderate persistent 04/17/2018   Derangement of posterior horn of medial meniscus 01/2014   left   GERD (gastroesophageal reflux disease)    no current med.   Gestational diabetes    Hypertension    under control with med., has been on med. x 3 yr.   Left ACL tear 01/2014   Migraines    occasional   Seasonal allergies    current nasal congestion (01/24/2014)   Supervision of other normal pregnancy, antepartum 04/17/2018    Patient Active Problem List   Diagnosis Date Noted   History of diet controlled gestational diabetes mellitus (GDM) 12/28/2018   HELLP (hemolytic anemia/elev liver enzymes/low platelets in pregnancy) 12/04/2018   Unwanted fertility 10/23/2018   PCOS (polycystic ovarian syndrome) 04/17/2018   GERD (gastroesophageal reflux disease) 04/17/2018   Asthma, mild intermittent 04/17/2018   Migraines    Obesity 02/02/2018   High blood pressure 02/02/2018    Past Surgical History:  Procedure Laterality Date   CHOLECYSTECTOMY  08/2019   KNEE ARTHROSCOPY WITH ANTERIOR CRUCIATE LIGAMENT (ACL) REPAIR Left 01/28/2014   Procedure: LEFT KNEE ARTHROSCOPY WITH MEDIAL MENISCECTOMY AND ANTERIOR CRUCIATE  LIGAMENT REPAIR;  Surgeon: Ferd Householder, MD;  Location: [facility];  Service: Orthopedics;  Laterality: Left;   ORIF ANKLE FRACTURE Left 07/19/2013   Procedure: LEFT ANKLE FRACTURE OPEN TREATMENT BIMALLEOLAR ANKLE INCLUDES INTERNAL FIXATION, LEFT ANKLE FRACTURE OPEN TREATMENT DISTAL TIBIOFIBULAR fasciotomy INCLUDES INTERNAL FIXATION repai of synosmosis;  Surgeon: Ferd Householder, MD;  Location: [facility];  Service: Orthopedics;  Laterality: Left;   TUBAL LIGATION N/A 11/16/2018   Procedure: POST PARTUM TUBAL LIGATION;  Surgeon: Granville Layer, MD;  Location: [facility];  Service: Gynecology;  Laterality: N/A;    OB History     Gravida  4   Para  3   Term  3   Preterm  0   AB  1   Living  3      SAB  0   IAB  1   Ectopic  0   Multiple  0   Live Births  3            Home Medications    Prior to Admission medications   Medication Sig Start Date End Date Taking? Authorizing Provider  cyclobenzaprine (FLEXERIL) 10 MG tablet Take 1 tablet (10 mg total) by mouth 2 (two) times daily as needed for muscle spasms. 07/24/23  Yes Vernestine Gondola, PA-C  predniSONE  (DELTASONE ) 20 MG tablet Take 2 tablets (40 mg total) by mouth daily with breakfast for 5  days. 07/24/23 07/29/23 Yes Vernestine Gondola, PA-C  acetaminophen  (TYLENOL ) 500 MG tablet Take 1,000 mg by mouth every 6 (six) hours as needed for mild pain.    [provider]  diphenhydrAMINE  (BENADRYL ) 25 MG tablet Take 25 mg by mouth every 6 (six) hours as needed for itching or sleep.     [provider]  fluticasone (FLONASE) 50 MCG/ACT nasal spray Place 1 spray into both nostrils daily.     [provider]  ibuprofen  (ADVIL ) 600 MG tablet Take 1 tablet (600 mg total) by mouth every 6 (six) hours as needed for mild pain or cramping. 12/06/18   Constant, Peggy, MD  lisinopril  (ZESTRIL ) 20 MG tablet Take 1 tablet (20 mg total) by mouth daily. 01/22/19   Anyanwu, Ugonna A,  MD  NIFEdipine  (ADALAT  CC) 30 MG 24 hr tablet Take 1 tablet (30 mg total) by mouth daily. 01/23/19   Anyanwu, Ugonna A, MD  PRENATAL 27-1 MG TABS Take 1 tablet by mouth daily. 03/30/18   Tresia Fruit, MD  promethazine  (PHENERGAN ) 12.5 MG tablet Take 1 tablet (12.5 mg total) by mouth every 6 (six) hours as needed for nausea or vomiting. Patient not taking: Reported on 12/04/2018 06/06/18   Anyanwu, Kathrine Paris, MD    Family History Family History  Problem Relation Age of Onset   Diabetes Maternal Grandfather    Diabetes Mother    Alcoholism Mother    Diabetes Father    Hypertension Father    Epilepsy Daughter    Alzheimer's disease Maternal Grandmother    Glaucoma Maternal Grandmother     Social History Social History   Tobacco Use   Smoking status: Never    Passive exposure: Current   Smokeless tobacco: Never  Vaping Use   Vaping status: Never Used  Substance Use Topics   Alcohol use: Not Currently    Comment: socially   Drug use: No     Allergies   Patient has no known allergies.   Review of Systems Review of Systems  Constitutional:  Negative for chills and fever.  Eyes:  Negative for discharge and redness.  Respiratory:  Negative for shortness of breath.   Gastrointestinal:  Negative for abdominal pain, nausea and vomiting.  Musculoskeletal:  Positive for back pain and myalgias.  Neurological:  Negative for numbness.     Physical Exam Triage Vital Signs ED Triage Vitals  Encounter Vitals Group     BP 07/24/23 1402 121/84     Girls Systolic BP Percentile --      Girls Diastolic BP Percentile --      Boys Systolic BP Percentile --      Boys Diastolic BP Percentile --      Pulse Rate 07/24/23 1402 96     Resp 07/24/23 1402 18     Temp 07/24/23 1402 98.1 F (36.7 C)     Temp Source 07/24/23 1402 Oral     SpO2 07/24/23 1402 97 %     Weight 07/24/23 1400 254 lb (115.2 kg)     Height --      Head Circumference --      Peak Flow --      Pain Score  07/24/23 1359 7     Pain Loc --      Pain Education --      Exclude from Growth Chart --    No data found.  Updated Vital Signs BP 121/84 (BP Location: Left Arm)   Pulse 96  Temp 98.1 F (36.7 C) (Oral)   Resp 18   Wt 254 lb (115.2 kg)   LMP 06/27/2023 (Approximate)   SpO2 97%   Breastfeeding No   BMI 38.62 kg/m   Visual Acuity Right Eye Distance:   Left Eye Distance:   Bilateral Distance:    Right Eye Near:   Left Eye Near:    Bilateral Near:     Physical Exam Vitals and nursing note reviewed.  Constitutional:      General: She is not in acute distress.    Appearance: Normal appearance. She is not ill-appearing.  HENT:     Head: Normocephalic and atraumatic.   Eyes:     Conjunctiva/sclera: Conjunctivae normal.    Cardiovascular:     Rate and Rhythm: Normal rate.  Pulmonary:     Effort: Pulmonary effort is normal. No respiratory distress.   Musculoskeletal:     Comments: No TTP to midline thoracic or lumbar spine. Mild TTP to lower thoracic back, no TTP to low back   Neurological:     Mental Status: She is alert.   Psychiatric:        Mood and Affect: Mood normal.        Behavior: Behavior normal.        Thought Content: Thought content normal.      UC Treatments / Results  Labs (all labs ordered are listed, but only abnormal results are displayed) Labs Reviewed - No data to display  EKG   Radiology No results found.  Procedures Procedures (including critical care time)  Medications Ordered in UC Medications - No data to display  Initial Impression / Assessment and Plan / UC Course  I have reviewed the triage vital signs and the nursing notes.  Pertinent labs & imaging results that were available during my care of the patient were reviewed by me and considered in my medical decision making (see chart for details).    Suspect muscular etiology of symptoms with sciatica due to low back inflammation.  Will treat with steroid burst and  muscle relaxer.  Discussed that muscle laxer may cause drowsiness and to avoid NSAIDs while taking steroids.  Patient expresses understanding.  Advised to use heat application as well as massage to help improve symptoms.  Encouraged to avoid heavy lifting and to use slow controlled stretches.  Encouraged follow-up if no gradual improvement with any worsening.  Final Clinical Impressions(s) / UC Diagnoses   Final diagnoses:  Acute left-sided low back pain with left-sided sciatica  Acute bilateral thoracic back pain   Discharge Instructions   None    ED Prescriptions     Medication Sig Dispense Auth. Provider   predniSONE  (DELTASONE ) 20 MG tablet Take 2 tablets (40 mg total) by mouth daily with breakfast for 5 days. 10 tablet Jami Mcclintock F, PA-C   cyclobenzaprine (FLEXERIL) 10 MG tablet Take 1 tablet (10 mg total) by mouth 2 (two) times daily as needed for muscle spasms. 20 tablet Vernestine Gondola, PA-C      PDMP not reviewed this encounter.   Vernestine Gondola, PA-C 07/24/23 1451
